# Patient Record
Sex: Female | Born: 1950
Health system: Southern US, Community
[De-identification: ages and names within clinical notes are randomized; demographics above are authoritative.]

## PROBLEM LIST (undated history)

## (undated) DIAGNOSIS — C569 Malignant neoplasm of unspecified ovary: Secondary | ICD-10-CM

## (undated) DIAGNOSIS — C801 Malignant (primary) neoplasm, unspecified: Secondary | ICD-10-CM

## (undated) DIAGNOSIS — S83209A Unspecified tear of unspecified meniscus, current injury, unspecified knee, initial encounter: Secondary | ICD-10-CM

## (undated) DIAGNOSIS — N189 Chronic kidney disease, unspecified: Secondary | ICD-10-CM

## (undated) HISTORY — PX: APPENDECTOMY: SHX54

## (undated) HISTORY — DX: Malignant neoplasm of unspecified ovary: C56.9

---

## 2000-01-29 ENCOUNTER — Other Ambulatory Visit: Admission: RE | Admit: 2000-01-29 | Discharge: 2000-01-29 | Payer: Self-pay | Admitting: *Deleted

## 2001-03-03 ENCOUNTER — Other Ambulatory Visit: Admission: RE | Admit: 2001-03-03 | Discharge: 2001-03-03 | Payer: Self-pay | Admitting: *Deleted

## 2002-04-13 ENCOUNTER — Other Ambulatory Visit: Admission: RE | Admit: 2002-04-13 | Discharge: 2002-04-13 | Payer: Self-pay | Admitting: *Deleted

## 2003-04-19 ENCOUNTER — Other Ambulatory Visit: Admission: RE | Admit: 2003-04-19 | Discharge: 2003-04-19 | Payer: Self-pay | Admitting: *Deleted

## 2003-10-06 HISTORY — PX: BREAST LUMPECTOMY: SHX2

## 2004-03-28 ENCOUNTER — Emergency Department (HOSPITAL_COMMUNITY): Admission: EM | Admit: 2004-03-28 | Discharge: 2004-03-28 | Payer: Self-pay | Admitting: Emergency Medicine

## 2004-04-28 ENCOUNTER — Encounter: Admission: RE | Admit: 2004-04-28 | Discharge: 2004-04-28 | Payer: Self-pay | Admitting: *Deleted

## 2004-05-05 ENCOUNTER — Encounter: Admission: RE | Admit: 2004-05-05 | Discharge: 2004-05-05 | Payer: Self-pay | Admitting: *Deleted

## 2004-05-15 ENCOUNTER — Encounter: Admission: RE | Admit: 2004-05-15 | Discharge: 2004-05-15 | Payer: Self-pay | Admitting: *Deleted

## 2004-05-15 ENCOUNTER — Encounter (INDEPENDENT_AMBULATORY_CARE_PROVIDER_SITE_OTHER): Payer: Self-pay | Admitting: Specialist

## 2004-05-27 ENCOUNTER — Encounter (HOSPITAL_COMMUNITY): Admission: RE | Admit: 2004-05-27 | Discharge: 2004-06-10 | Payer: Self-pay | Admitting: General Surgery

## 2004-06-11 ENCOUNTER — Encounter: Admission: RE | Admit: 2004-06-11 | Discharge: 2004-06-11 | Payer: Self-pay | Admitting: Oncology

## 2004-06-20 ENCOUNTER — Encounter (INDEPENDENT_AMBULATORY_CARE_PROVIDER_SITE_OTHER): Payer: Self-pay | Admitting: Specialist

## 2004-06-20 ENCOUNTER — Ambulatory Visit (HOSPITAL_COMMUNITY): Admission: RE | Admit: 2004-06-20 | Discharge: 2004-06-20 | Payer: Self-pay | Admitting: General Surgery

## 2004-06-20 ENCOUNTER — Ambulatory Visit (HOSPITAL_BASED_OUTPATIENT_CLINIC_OR_DEPARTMENT_OTHER): Admission: RE | Admit: 2004-06-20 | Discharge: 2004-06-20 | Payer: Self-pay | Admitting: General Surgery

## 2004-06-26 ENCOUNTER — Ambulatory Visit (HOSPITAL_COMMUNITY): Admission: RE | Admit: 2004-06-26 | Discharge: 2004-06-26 | Payer: Self-pay | Admitting: Oncology

## 2004-07-15 ENCOUNTER — Ambulatory Visit: Admission: RE | Admit: 2004-07-15 | Discharge: 2004-07-15 | Payer: Self-pay | Admitting: Oncology

## 2004-07-15 ENCOUNTER — Encounter (INDEPENDENT_AMBULATORY_CARE_PROVIDER_SITE_OTHER): Payer: Self-pay | Admitting: Cardiology

## 2004-07-18 ENCOUNTER — Ambulatory Visit: Admission: RE | Admit: 2004-07-18 | Discharge: 2004-09-12 | Payer: Self-pay | Admitting: Radiation Oncology

## 2004-08-13 ENCOUNTER — Ambulatory Visit (HOSPITAL_BASED_OUTPATIENT_CLINIC_OR_DEPARTMENT_OTHER): Admission: RE | Admit: 2004-08-13 | Discharge: 2004-08-13 | Payer: Self-pay | Admitting: General Surgery

## 2004-08-18 ENCOUNTER — Ambulatory Visit: Payer: Self-pay | Admitting: Oncology

## 2004-10-05 IMAGING — CT CT ABDOMEN W/ CM
1 of 4 series · 14 of 32 positions shown, 19 images · IV contrast (omnipaque)
Comparison: none

CLINICAL DATA: Breast cancer, new diagnosis.  Rule out metastatic disease.
 CT CHEST WITH CONTRAST:
 150 cc of Omnipaque 300 IV.  No comparison. 
 There has been recent breast biopsy on the right with some right axillary hematoma and gas in the soft tissues.  There is no adenopathy in the chest.  There is no lung nodule.  There is no infiltrate or effusion.
 IMPRESSION
 Postoperative changes in the right breast and axilla.  Negative for metastatic disease to the chest.
 CT ABDOMEN WITH CONTRAST:
 The liver and spleen, pancreas, and kidneys are normal.  There is no adenopathy.  The bowel is not dilated.
 Negative.
 CT PELVIS WITH CONTRAST:
 The bowel is not dilated.  There is no adenopathy.  The uterus is mildly enlarged and heterogeneous compatible with multiple small fibroids.  The right ovary measures 19 x 21 mm with a probable 12 mm complex cyst.
 Negative for metastatic disease to the pelvis.
 CT BRAIN WITHOUT AND WITH CONTRAST:
 No comparison. 
 The ventricles are not enlarged.  There is no hemorrhage, mass, or infarct.  The enhancement pattern is normal.  The skull is normal.
 Negative for metastatic disease to the brain.

[Series 2: cap w/iv 5.0 b30f · axial · 0.61mm/px · z∈[-631,-71]mm · 14 of 128 slices shown, 19 images]
[im 8/128  soft-tissue]
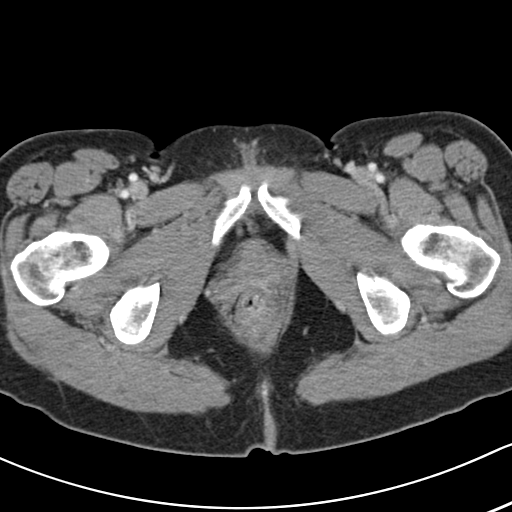
[im 8/128  bone]
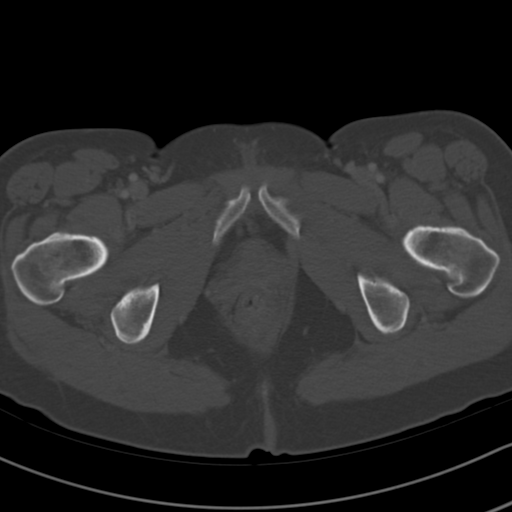
[im 16/128  soft-tissue]
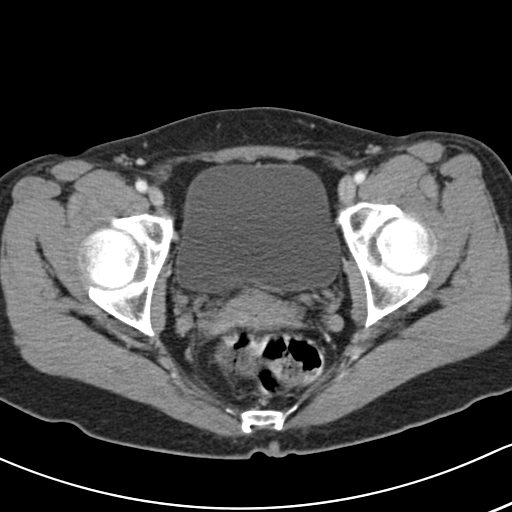
[im 24/128  soft-tissue]
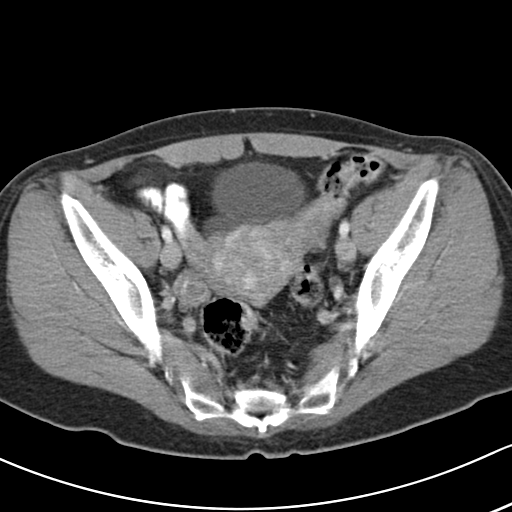
[im 40/128  soft-tissue]
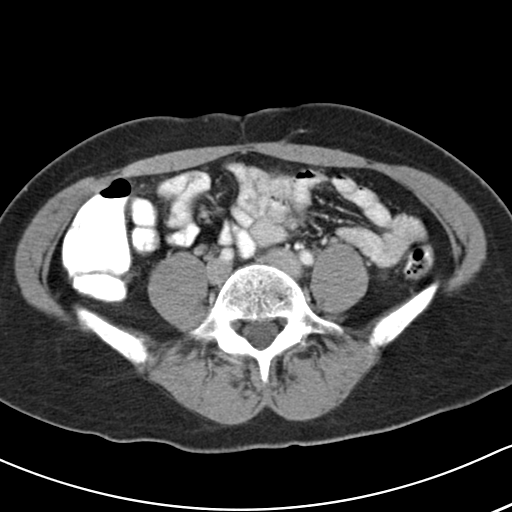
[im 48/128  soft-tissue]
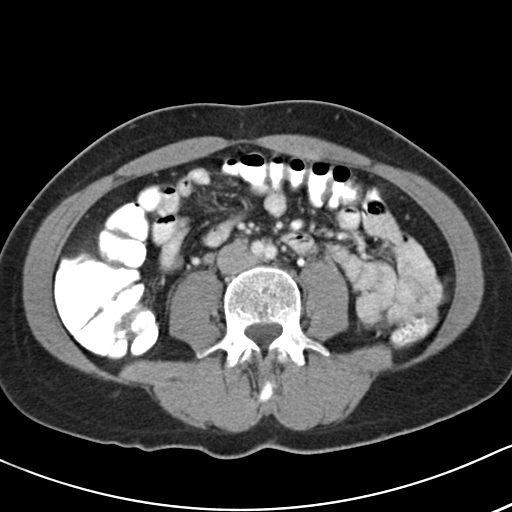
[im 56/128  soft-tissue]
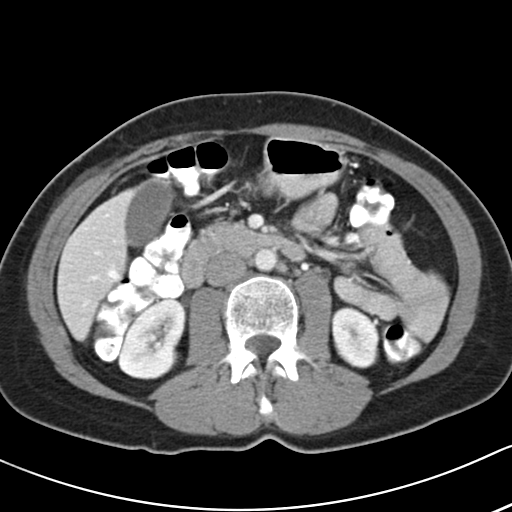
[im 64/128  soft-tissue]
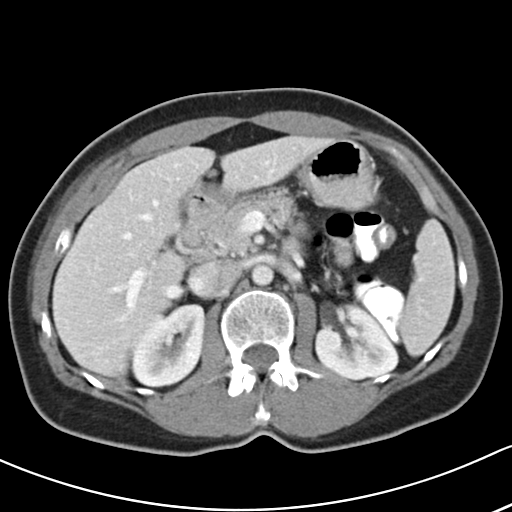
[im 72/128  soft-tissue]
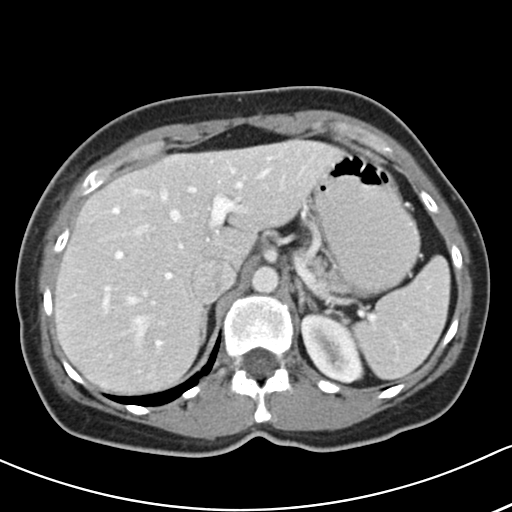
[im 80/128  soft-tissue]
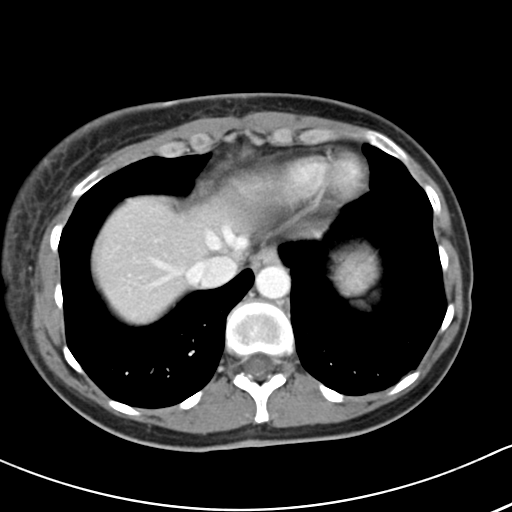
[im 80/128  bone]
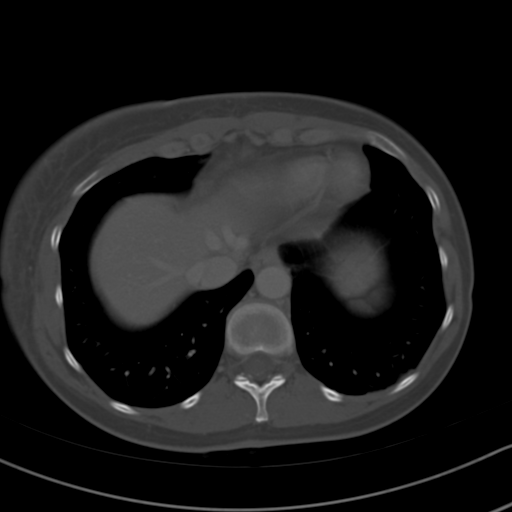
[im 88/128  soft-tissue]
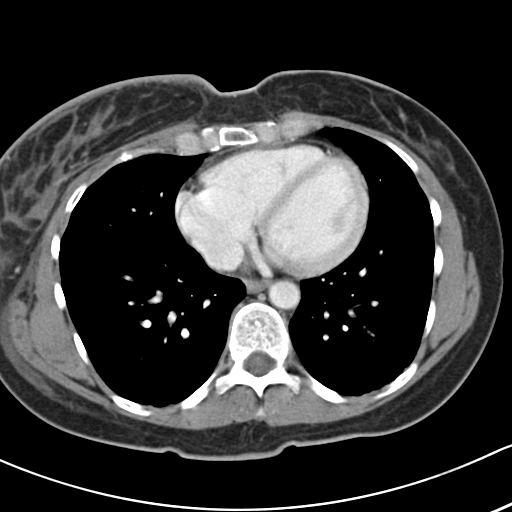
[im 96/128  lung]
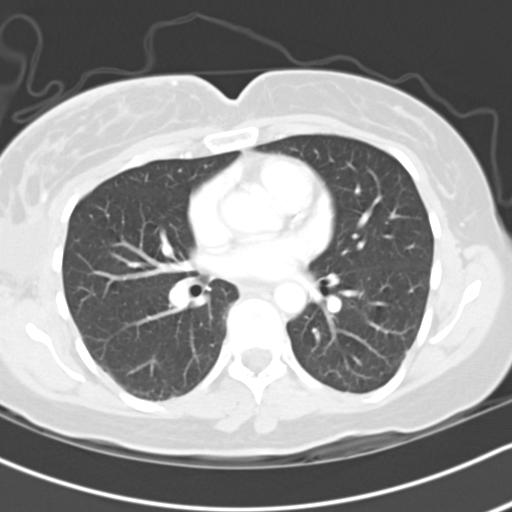
[im 104/128  soft-tissue]
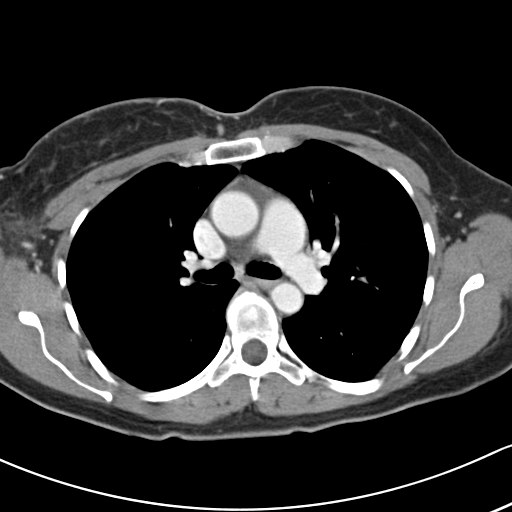
[im 104/128  lung]
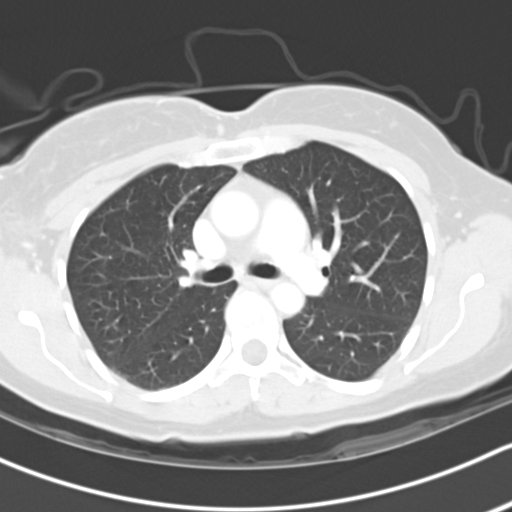
[im 112/128  soft-tissue]
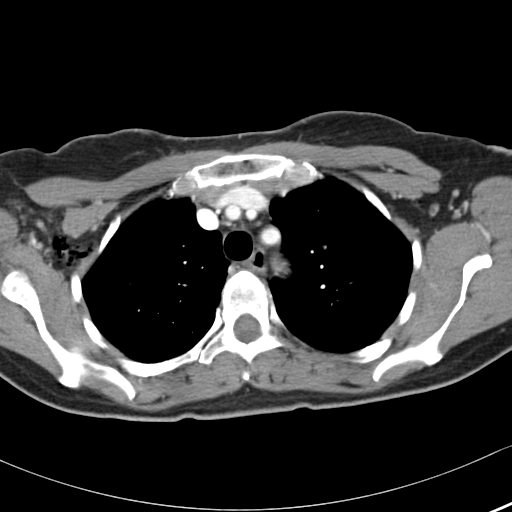
[im 112/128  lung]
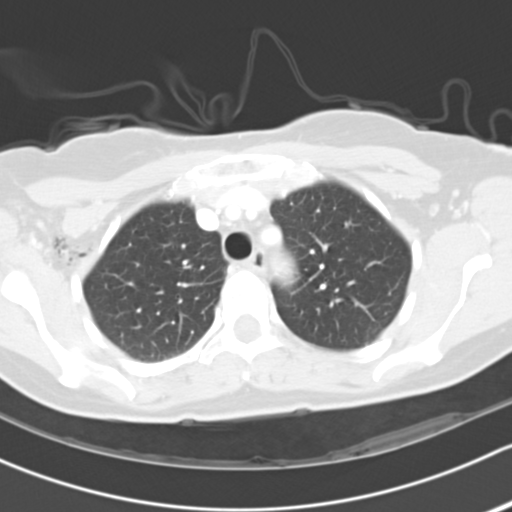
[im 120/128  soft-tissue]
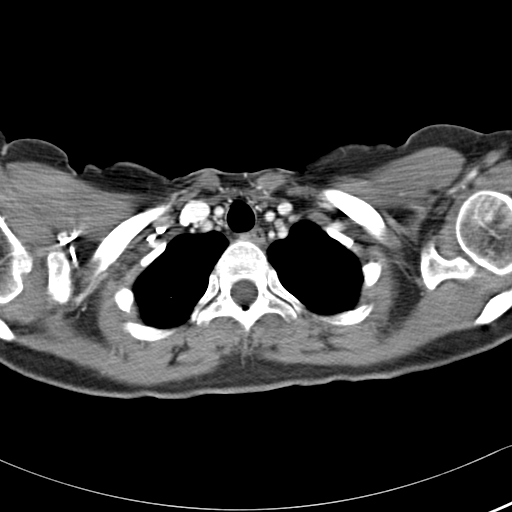
[im 120/128  lung]
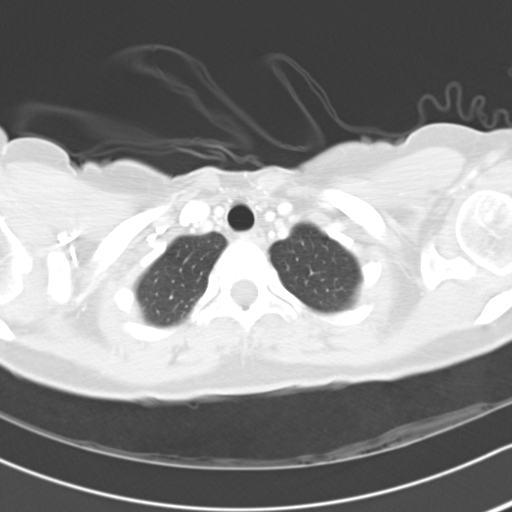

[14 of 32 positions shown; findings below may reference images not displayed]

## 2004-10-08 ENCOUNTER — Ambulatory Visit: Payer: Self-pay | Admitting: Oncology

## 2004-11-04 ENCOUNTER — Ambulatory Visit: Admission: RE | Admit: 2004-11-04 | Discharge: 2005-01-15 | Payer: Self-pay | Admitting: Radiation Oncology

## 2004-11-13 ENCOUNTER — Encounter: Admission: RE | Admit: 2004-11-13 | Discharge: 2004-11-13 | Payer: Self-pay | Admitting: Radiation Oncology

## 2004-11-13 ENCOUNTER — Encounter (INDEPENDENT_AMBULATORY_CARE_PROVIDER_SITE_OTHER): Payer: Self-pay | Admitting: *Deleted

## 2004-11-13 ENCOUNTER — Ambulatory Visit: Admission: RE | Admit: 2004-11-13 | Discharge: 2004-11-13 | Payer: Self-pay | Admitting: Oncology

## 2004-11-25 ENCOUNTER — Ambulatory Visit: Payer: Self-pay | Admitting: Oncology

## 2004-12-01 ENCOUNTER — Ambulatory Visit (HOSPITAL_BASED_OUTPATIENT_CLINIC_OR_DEPARTMENT_OTHER): Admission: RE | Admit: 2004-12-01 | Discharge: 2004-12-01 | Payer: Self-pay | Admitting: General Surgery

## 2005-02-04 ENCOUNTER — Ambulatory Visit: Admission: RE | Admit: 2005-02-04 | Discharge: 2005-02-04 | Payer: Self-pay | Admitting: Radiation Oncology

## 2005-02-11 ENCOUNTER — Encounter: Admission: RE | Admit: 2005-02-11 | Discharge: 2005-02-20 | Payer: Self-pay | Admitting: Radiation Oncology

## 2005-03-23 ENCOUNTER — Ambulatory Visit: Payer: Self-pay | Admitting: Oncology

## 2005-06-02 ENCOUNTER — Encounter: Admission: RE | Admit: 2005-06-02 | Discharge: 2005-06-02 | Payer: Self-pay | Admitting: Radiation Oncology

## 2005-06-30 ENCOUNTER — Ambulatory Visit: Payer: Self-pay | Admitting: Oncology

## 2005-07-07 ENCOUNTER — Ambulatory Visit: Admission: RE | Admit: 2005-07-07 | Discharge: 2005-07-07 | Payer: Self-pay | Admitting: Oncology

## 2005-07-07 ENCOUNTER — Encounter (INDEPENDENT_AMBULATORY_CARE_PROVIDER_SITE_OTHER): Payer: Self-pay | Admitting: Cardiology

## 2005-10-06 ENCOUNTER — Ambulatory Visit: Payer: Self-pay | Admitting: Oncology

## 2005-10-07 ENCOUNTER — Encounter: Admission: RE | Admit: 2005-10-07 | Discharge: 2005-10-07 | Payer: Self-pay | Admitting: Oncology

## 2006-03-09 ENCOUNTER — Ambulatory Visit (HOSPITAL_COMMUNITY): Admission: RE | Admit: 2006-03-09 | Discharge: 2006-03-09 | Payer: Self-pay | Admitting: Oncology

## 2006-03-31 ENCOUNTER — Ambulatory Visit: Payer: Self-pay | Admitting: Oncology

## 2006-04-08 LAB — CBC WITH DIFFERENTIAL/PLATELET
BASO%: 0.3 % (ref 0.0–2.0)
Basophils Absolute: 0 10*3/uL (ref 0.0–0.1)
EOS%: 1.9 % (ref 0.0–7.0)
Eosinophils Absolute: 0.1 10*3/uL (ref 0.0–0.5)
HCT: 38.4 % (ref 34.8–46.6)
HGB: 13.4 g/dL (ref 11.6–15.9)
LYMPH%: 30.7 % (ref 14.0–48.0)
MCH: 31.4 pg (ref 26.0–34.0)
MCHC: 34.8 g/dL (ref 32.0–36.0)
MCV: 90.1 fL (ref 81.0–101.0)
MONO#: 0.2 10*3/uL (ref 0.1–0.9)
MONO%: 5.2 % (ref 0.0–13.0)
NEUT#: 2.7 10*3/uL (ref 1.5–6.5)
NEUT%: 61.9 % (ref 39.6–76.8)
Platelets: 205 10*3/uL (ref 145–400)
RBC: 4.27 10*6/uL (ref 3.70–5.32)
RDW: 12.9 % (ref 11.3–14.5)
WBC: 4.3 10*3/uL (ref 3.9–10.0)
lymph#: 1.3 10*3/uL (ref 0.9–3.3)

## 2006-04-08 LAB — COMPREHENSIVE METABOLIC PANEL
ALT: 14 U/L (ref 0–40)
AST: 19 U/L (ref 0–37)
Albumin: 4.4 g/dL (ref 3.5–5.2)
Alkaline Phosphatase: 82 U/L (ref 39–117)
BUN: 15 mg/dL (ref 6–23)
CO2: 29 mEq/L (ref 19–32)
Calcium: 9.8 mg/dL (ref 8.4–10.5)
Chloride: 108 mEq/L (ref 96–112)
Creatinine, Ser: 0.96 mg/dL (ref 0.40–1.20)
Glucose, Bld: 94 mg/dL (ref 70–99)
Potassium: 4 mEq/L (ref 3.5–5.3)
Sodium: 145 mEq/L (ref 135–145)
Total Bilirubin: 0.7 mg/dL (ref 0.3–1.2)
Total Protein: 6.8 g/dL (ref 6.0–8.3)

## 2006-04-08 LAB — LIPID PANEL
Cholesterol: 177 mg/dL (ref 0–200)
HDL: 58 mg/dL (ref >39)
LDL Cholesterol: 96 mg/dL (ref 0–99)
Total CHOL/HDL Ratio: 3.1 Ratio
Triglycerides: 115 mg/dL (ref <150)
VLDL: 23 mg/dL (ref 0–40)

## 2006-04-08 LAB — CANCER ANTIGEN 27.29: CA 27.29: 23 U/mL (ref 0–39)

## 2006-04-26 ENCOUNTER — Encounter: Admission: RE | Admit: 2006-04-26 | Discharge: 2006-05-19 | Payer: Self-pay | Admitting: Oncology

## 2006-06-04 ENCOUNTER — Encounter: Admission: RE | Admit: 2006-06-04 | Discharge: 2006-06-04 | Payer: Self-pay | Admitting: General Surgery

## 2006-10-06 ENCOUNTER — Ambulatory Visit: Payer: Self-pay | Admitting: Oncology

## 2006-10-08 LAB — COMPREHENSIVE METABOLIC PANEL
ALT: 12 U/L (ref 0–35)
AST: 19 U/L (ref 0–37)
Albumin: 4.6 g/dL (ref 3.5–5.2)
Alkaline Phosphatase: 85 U/L (ref 39–117)
BUN: 12 mg/dL (ref 6–23)
CO2: 27 mEq/L (ref 19–32)
Calcium: 9.8 mg/dL (ref 8.4–10.5)
Chloride: 106 mEq/L (ref 96–112)
Creatinine, Ser: 0.96 mg/dL (ref 0.40–1.20)
Glucose, Bld: 101 mg/dL — ABNORMAL HIGH (ref 70–99)
Potassium: 4 mEq/L (ref 3.5–5.3)
Sodium: 143 mEq/L (ref 135–145)
Total Bilirubin: 0.9 mg/dL (ref 0.3–1.2)
Total Protein: 6.9 g/dL (ref 6.0–8.3)

## 2006-10-08 LAB — CBC WITH DIFFERENTIAL/PLATELET
BASO%: 0.5 % (ref 0.0–2.0)
Basophils Absolute: 0 10*3/uL (ref 0.0–0.1)
EOS%: 1.2 % (ref 0.0–7.0)
Eosinophils Absolute: 0 10*3/uL (ref 0.0–0.5)
HCT: 40.7 % (ref 34.8–46.6)
HGB: 14 g/dL (ref 11.6–15.9)
LYMPH%: 35.7 % (ref 14.0–48.0)
MCH: 30.7 pg (ref 26.0–34.0)
MCHC: 34.3 g/dL (ref 32.0–36.0)
MCV: 89.5 fL (ref 81.0–101.0)
MONO#: 0.2 10*3/uL (ref 0.1–0.9)
MONO%: 5.9 % (ref 0.0–13.0)
NEUT#: 2.1 10*3/uL (ref 1.5–6.5)
NEUT%: 56.7 % (ref 39.6–76.8)
Platelets: 241 10*3/uL (ref 145–400)
RBC: 4.55 10*6/uL (ref 3.70–5.32)
RDW: 12.7 % (ref 11.3–14.5)
WBC: 3.6 10*3/uL — ABNORMAL LOW (ref 3.9–10.0)
lymph#: 1.3 10*3/uL (ref 0.9–3.3)

## 2006-10-08 LAB — CANCER ANTIGEN 27.29: CA 27.29: 21 U/mL (ref 0–39)

## 2007-06-03 ENCOUNTER — Ambulatory Visit: Payer: Self-pay | Admitting: Oncology

## 2007-06-08 ENCOUNTER — Encounter: Admission: RE | Admit: 2007-06-08 | Discharge: 2007-06-08 | Payer: Self-pay | Admitting: Oncology

## 2007-06-08 LAB — COMPREHENSIVE METABOLIC PANEL
ALT: 12 U/L (ref 0–35)
AST: 19 U/L (ref 0–37)
Albumin: 4.5 g/dL (ref 3.5–5.2)
Alkaline Phosphatase: 84 U/L (ref 39–117)
BUN: 11 mg/dL (ref 6–23)
CO2: 26 mEq/L (ref 19–32)
Calcium: 9.5 mg/dL (ref 8.4–10.5)
Chloride: 107 mEq/L (ref 96–112)
Creatinine, Ser: 0.94 mg/dL (ref 0.40–1.20)
Glucose, Bld: 104 mg/dL — ABNORMAL HIGH (ref 70–99)
Potassium: 3.7 mEq/L (ref 3.5–5.3)
Sodium: 140 mEq/L (ref 135–145)
Total Bilirubin: 0.7 mg/dL (ref 0.3–1.2)
Total Protein: 6.8 g/dL (ref 6.0–8.3)

## 2007-06-08 LAB — CBC WITH DIFFERENTIAL/PLATELET
BASO%: 0.6 % (ref 0.0–2.0)
Basophils Absolute: 0 10*3/uL (ref 0.0–0.1)
EOS%: 2.6 % (ref 0.0–7.0)
Eosinophils Absolute: 0.1 10*3/uL (ref 0.0–0.5)
HCT: 38.7 % (ref 34.8–46.6)
HGB: 13.9 g/dL (ref 11.6–15.9)
LYMPH%: 38.8 % (ref 14.0–48.0)
MCH: 30.6 pg (ref 26.0–34.0)
MCHC: 35.8 g/dL (ref 32.0–36.0)
MCV: 85.4 fL (ref 81.0–101.0)
MONO#: 0.2 10*3/uL (ref 0.1–0.9)
MONO%: 6.1 % (ref 0.0–13.0)
NEUT#: 1.8 10*3/uL (ref 1.5–6.5)
NEUT%: 51.9 % (ref 39.6–76.8)
Platelets: 200 10*3/uL (ref 145–400)
RBC: 4.54 10*6/uL (ref 3.70–5.32)
RDW: 10.9 % — ABNORMAL LOW (ref 11.3–14.5)
WBC: 3.5 10*3/uL — ABNORMAL LOW (ref 3.9–10.0)
lymph#: 1.4 10*3/uL (ref 0.9–3.3)

## 2007-06-08 LAB — CANCER ANTIGEN 27.29: CA 27.29: 18 U/mL (ref 0–39)

## 2007-06-08 LAB — FOLLICLE STIMULATING HORMONE: FSH: 76.9 m[IU]/mL

## 2007-06-10 ENCOUNTER — Ambulatory Visit (HOSPITAL_COMMUNITY): Admission: RE | Admit: 2007-06-10 | Discharge: 2007-06-10 | Payer: Self-pay | Admitting: Oncology

## 2007-06-15 LAB — ESTRADIOL, ULTRA SENS: Estradiol, Ultra Sensitive: 14 pg/mL

## 2007-12-20 ENCOUNTER — Ambulatory Visit: Payer: Self-pay | Admitting: Oncology

## 2007-12-22 LAB — CBC WITH DIFFERENTIAL/PLATELET
BASO%: 0.3 % (ref 0.0–2.0)
Basophils Absolute: 0 10*3/uL (ref 0.0–0.1)
EOS%: 1.5 % (ref 0.0–7.0)
Eosinophils Absolute: 0.1 10*3/uL (ref 0.0–0.5)
HCT: 42.3 % (ref 34.8–46.6)
HGB: 15 g/dL (ref 11.6–15.9)
LYMPH%: 38 % (ref 14.0–48.0)
MCH: 30.6 pg (ref 26.0–34.0)
MCHC: 35.4 g/dL (ref 32.0–36.0)
MCV: 86.6 fL (ref 81.0–101.0)
MONO#: 0.2 10*3/uL (ref 0.1–0.9)
MONO%: 5 % (ref 0.0–13.0)
NEUT#: 2.2 10*3/uL (ref 1.5–6.5)
NEUT%: 55.2 % (ref 39.6–76.8)
Platelets: 225 10*3/uL (ref 145–400)
RBC: 4.88 10*6/uL (ref 3.70–5.32)
RDW: 12.8 % (ref 11.3–14.5)
WBC: 4 10*3/uL (ref 3.9–10.0)
lymph#: 1.5 10*3/uL (ref 0.9–3.3)

## 2007-12-23 LAB — COMPREHENSIVE METABOLIC PANEL
ALT: 11 U/L (ref 0–35)
AST: 19 U/L (ref 0–37)
Albumin: 4.7 g/dL (ref 3.5–5.2)
Alkaline Phosphatase: 93 U/L (ref 39–117)
BUN: 12 mg/dL (ref 6–23)
CO2: 25 mEq/L (ref 19–32)
Calcium: 9.9 mg/dL (ref 8.4–10.5)
Chloride: 106 mEq/L (ref 96–112)
Creatinine, Ser: 0.84 mg/dL (ref 0.40–1.20)
Glucose, Bld: 98 mg/dL (ref 70–99)
Potassium: 4.1 mEq/L (ref 3.5–5.3)
Sodium: 143 mEq/L (ref 135–145)
Total Bilirubin: 0.9 mg/dL (ref 0.3–1.2)
Total Protein: 7.5 g/dL (ref 6.0–8.3)

## 2007-12-23 LAB — LIPID PANEL
Cholesterol: 215 mg/dL — ABNORMAL HIGH (ref 0–200)
HDL: 64 mg/dL (ref >39)
LDL Cholesterol: 129 mg/dL — ABNORMAL HIGH (ref 0–99)
Total CHOL/HDL Ratio: 3.4 Ratio
Triglycerides: 108 mg/dL (ref <150)
VLDL: 22 mg/dL (ref 0–40)

## 2007-12-23 LAB — VITAMIN D 25 HYDROXY (VIT D DEFICIENCY, FRACTURES): Vit D, 25-Hydroxy: 33 ng/mL (ref 30–89)

## 2007-12-23 LAB — CANCER ANTIGEN 27.29: CA 27.29: 25 U/mL (ref 0–39)

## 2007-12-27 LAB — VITAMIN D 1,25 DIHYDROXY: Vit D, 1,25-Dihydroxy: 78 pg/mL — ABNORMAL HIGH (ref 6–62)

## 2008-06-12 ENCOUNTER — Encounter: Admission: RE | Admit: 2008-06-12 | Discharge: 2008-06-12 | Payer: Self-pay | Admitting: Oncology

## 2008-06-19 ENCOUNTER — Ambulatory Visit: Payer: Self-pay | Admitting: Oncology

## 2008-06-21 LAB — COMPREHENSIVE METABOLIC PANEL
ALT: 13 U/L (ref 0–35)
AST: 17 U/L (ref 0–37)
Albumin: 4.6 g/dL (ref 3.5–5.2)
Alkaline Phosphatase: 87 U/L (ref 39–117)
BUN: 9 mg/dL (ref 6–23)
CO2: 27 mEq/L (ref 19–32)
Calcium: 10.1 mg/dL (ref 8.4–10.5)
Chloride: 108 mEq/L (ref 96–112)
Creatinine, Ser: 0.92 mg/dL (ref 0.40–1.20)
Glucose, Bld: 88 mg/dL (ref 70–99)
Potassium: 4.1 mEq/L (ref 3.5–5.3)
Sodium: 143 mEq/L (ref 135–145)
Total Bilirubin: 0.9 mg/dL (ref 0.3–1.2)
Total Protein: 7.1 g/dL (ref 6.0–8.3)

## 2008-06-21 LAB — CBC WITH DIFFERENTIAL/PLATELET
BASO%: 0.3 % (ref 0.0–2.0)
Basophils Absolute: 0 10*3/uL (ref 0.0–0.1)
EOS%: 1.3 % (ref 0.0–7.0)
Eosinophils Absolute: 0 10*3/uL (ref 0.0–0.5)
HCT: 42 % (ref 34.8–46.6)
HGB: 14.6 g/dL (ref 11.6–15.9)
LYMPH%: 37.2 % (ref 14.0–48.0)
MCH: 31 pg (ref 26.0–34.0)
MCHC: 34.9 g/dL (ref 32.0–36.0)
MCV: 88.7 fL (ref 81.0–101.0)
MONO#: 0.2 10*3/uL (ref 0.1–0.9)
MONO%: 6.6 % (ref 0.0–13.0)
NEUT#: 1.9 10*3/uL (ref 1.5–6.5)
NEUT%: 54.6 % (ref 39.6–76.8)
Platelets: 202 10*3/uL (ref 145–400)
RBC: 4.73 10*6/uL (ref 3.70–5.32)
RDW: 13.5 % (ref 11.3–14.5)
WBC: 3.4 10*3/uL — ABNORMAL LOW (ref 3.9–10.0)
lymph#: 1.3 10*3/uL (ref 0.9–3.3)

## 2008-06-21 LAB — CANCER ANTIGEN 27.29: CA 27.29: 27 U/mL (ref 0–39)

## 2008-11-21 ENCOUNTER — Other Ambulatory Visit: Admission: RE | Admit: 2008-11-21 | Discharge: 2008-11-21 | Payer: Self-pay | Admitting: Obstetrics & Gynecology

## 2008-11-23 ENCOUNTER — Ambulatory Visit: Payer: Self-pay | Admitting: Oncology

## 2008-11-23 LAB — CBC WITH DIFFERENTIAL/PLATELET
BASO%: 0.4 % (ref 0.0–2.0)
Basophils Absolute: 0 10*3/uL (ref 0.0–0.1)
EOS%: 1.9 % (ref 0.0–7.0)
Eosinophils Absolute: 0.1 10*3/uL (ref 0.0–0.5)
HCT: 42.4 % (ref 34.8–46.6)
HGB: 14.8 g/dL (ref 11.6–15.9)
LYMPH%: 36.9 % (ref 14.0–49.7)
MCH: 30.7 pg (ref 25.1–34.0)
MCHC: 34.9 g/dL (ref 31.5–36.0)
MCV: 87.9 fL (ref 79.5–101.0)
MONO#: 0.2 10*3/uL (ref 0.1–0.9)
MONO%: 8.1 % (ref 0.0–14.0)
NEUT#: 1.5 10*3/uL (ref 1.5–6.5)
NEUT%: 52.7 % (ref 38.4–76.8)
Platelets: 206 10*3/uL (ref 145–400)
RBC: 4.83 10*6/uL (ref 3.70–5.45)
RDW: 12.6 % (ref 11.2–14.5)
WBC: 2.8 10*3/uL — ABNORMAL LOW (ref 3.9–10.3)
lymph#: 1 10*3/uL (ref 0.9–3.3)

## 2008-11-26 LAB — LIPID PANEL
Cholesterol: 208 mg/dL — ABNORMAL HIGH (ref 0–200)
HDL: 63 mg/dL (ref >39)
LDL Cholesterol: 128 mg/dL — ABNORMAL HIGH (ref 0–99)
Total CHOL/HDL Ratio: 3.3 Ratio
Triglycerides: 85 mg/dL (ref <150)
VLDL: 17 mg/dL (ref 0–40)

## 2008-11-26 LAB — COMPREHENSIVE METABOLIC PANEL
ALT: 14 U/L (ref 0–35)
AST: 17 U/L (ref 0–37)
Albumin: 4.7 g/dL (ref 3.5–5.2)
Alkaline Phosphatase: 72 U/L (ref 39–117)
BUN: 13 mg/dL (ref 6–23)
CO2: 26 mEq/L (ref 19–32)
Calcium: 9.7 mg/dL (ref 8.4–10.5)
Chloride: 109 mEq/L (ref 96–112)
Creatinine, Ser: 0.89 mg/dL (ref 0.40–1.20)
Glucose, Bld: 93 mg/dL (ref 70–99)
Potassium: 3.9 mEq/L (ref 3.5–5.3)
Sodium: 142 mEq/L (ref 135–145)
Total Bilirubin: 0.6 mg/dL (ref 0.3–1.2)
Total Protein: 7.2 g/dL (ref 6.0–8.3)

## 2008-11-26 LAB — CANCER ANTIGEN 27.29: CA 27.29: 24 U/mL (ref 0–39)

## 2008-11-26 LAB — VITAMIN D 25 HYDROXY (VIT D DEFICIENCY, FRACTURES): Vit D, 25-Hydroxy: 31 ng/mL (ref 30–89)

## 2009-06-13 ENCOUNTER — Encounter: Admission: RE | Admit: 2009-06-13 | Discharge: 2009-06-13 | Payer: Self-pay | Admitting: Oncology

## 2009-06-18 ENCOUNTER — Ambulatory Visit: Payer: Self-pay | Admitting: Oncology

## 2009-06-20 LAB — CBC WITH DIFFERENTIAL/PLATELET
BASO%: 0.3 % (ref 0.0–2.0)
Basophils Absolute: 0 10*3/uL (ref 0.0–0.1)
EOS%: 1.3 % (ref 0.0–7.0)
Eosinophils Absolute: 0 10*3/uL (ref 0.0–0.5)
HCT: 40.4 % (ref 34.8–46.6)
HGB: 14 g/dL (ref 11.6–15.9)
LYMPH%: 34.9 % (ref 14.0–49.7)
MCH: 31.1 pg (ref 25.1–34.0)
MCHC: 34.7 g/dL (ref 31.5–36.0)
MCV: 89.5 fL (ref 79.5–101.0)
MONO#: 0.2 10*3/uL (ref 0.1–0.9)
MONO%: 7.5 % (ref 0.0–14.0)
NEUT#: 1.8 10*3/uL (ref 1.5–6.5)
NEUT%: 56 % (ref 38.4–76.8)
Platelets: 193 10*3/uL (ref 145–400)
RBC: 4.52 10*6/uL (ref 3.70–5.45)
RDW: 13.2 % (ref 11.2–14.5)
WBC: 3.2 10*3/uL — ABNORMAL LOW (ref 3.9–10.3)
lymph#: 1.1 10*3/uL (ref 0.9–3.3)

## 2009-06-21 LAB — COMPREHENSIVE METABOLIC PANEL
ALT: 11 U/L (ref 0–35)
AST: 18 U/L (ref 0–37)
Albumin: 4.3 g/dL (ref 3.5–5.2)
Alkaline Phosphatase: 64 U/L (ref 39–117)
BUN: 12 mg/dL (ref 6–23)
CO2: 25 mEq/L (ref 19–32)
Calcium: 9.8 mg/dL (ref 8.4–10.5)
Chloride: 110 mEq/L (ref 96–112)
Creatinine, Ser: 0.89 mg/dL (ref 0.40–1.20)
Glucose, Bld: 96 mg/dL (ref 70–99)
Potassium: 4.4 mEq/L (ref 3.5–5.3)
Sodium: 143 mEq/L (ref 135–145)
Total Bilirubin: 0.6 mg/dL (ref 0.3–1.2)
Total Protein: 6.6 g/dL (ref 6.0–8.3)

## 2009-06-21 LAB — LIPID PANEL
Cholesterol: 186 mg/dL (ref 0–200)
HDL: 57 mg/dL (ref >39)
LDL Cholesterol: 116 mg/dL — ABNORMAL HIGH (ref 0–99)
Total CHOL/HDL Ratio: 3.3 Ratio
Triglycerides: 66 mg/dL (ref <150)
VLDL: 13 mg/dL (ref 0–40)

## 2009-06-21 LAB — CANCER ANTIGEN 27.29: CA 27.29: 30 U/mL (ref 0–39)

## 2009-06-21 LAB — VITAMIN D 25 HYDROXY (VIT D DEFICIENCY, FRACTURES): Vit D, 25-Hydroxy: 31 ng/mL (ref 30–89)

## 2009-10-29 ENCOUNTER — Ambulatory Visit: Payer: Self-pay | Admitting: Oncology

## 2009-10-30 ENCOUNTER — Encounter: Admission: RE | Admit: 2009-10-30 | Discharge: 2009-10-30 | Payer: Self-pay | Admitting: Oncology

## 2009-11-07 ENCOUNTER — Encounter: Admission: RE | Admit: 2009-11-07 | Discharge: 2009-11-07 | Payer: Self-pay | Admitting: Oncology

## 2009-12-02 ENCOUNTER — Encounter: Admission: RE | Admit: 2009-12-02 | Discharge: 2009-12-31 | Payer: Self-pay | Admitting: Oncology

## 2009-12-11 ENCOUNTER — Ambulatory Visit: Payer: Self-pay | Admitting: Oncology

## 2009-12-13 LAB — CBC WITH DIFFERENTIAL/PLATELET
BASO%: 0.3 % (ref 0.0–2.0)
Basophils Absolute: 0 10*3/uL (ref 0.0–0.1)
EOS%: 2.4 % (ref 0.0–7.0)
Eosinophils Absolute: 0.1 10*3/uL (ref 0.0–0.5)
HCT: 41 % (ref 34.8–46.6)
HGB: 14.3 g/dL (ref 11.6–15.9)
LYMPH%: 30.1 % (ref 14.0–49.7)
MCH: 31.4 pg (ref 25.1–34.0)
MCHC: 34.8 g/dL (ref 31.5–36.0)
MCV: 90 fL (ref 79.5–101.0)
MONO#: 0.3 10*3/uL (ref 0.1–0.9)
MONO%: 6.3 % (ref 0.0–14.0)
NEUT#: 2.6 10*3/uL (ref 1.5–6.5)
NEUT%: 60.9 % (ref 38.4–76.8)
Platelets: 212 10*3/uL (ref 145–400)
RBC: 4.55 10*6/uL (ref 3.70–5.45)
RDW: 12.9 % (ref 11.2–14.5)
WBC: 4.3 10*3/uL (ref 3.9–10.3)
lymph#: 1.3 10*3/uL (ref 0.9–3.3)

## 2009-12-13 LAB — COMPREHENSIVE METABOLIC PANEL
ALT: 14 U/L (ref 0–35)
AST: 20 U/L (ref 0–37)
Albumin: 4.5 g/dL (ref 3.5–5.2)
Alkaline Phosphatase: 73 U/L (ref 39–117)
BUN: 18 mg/dL (ref 6–23)
CO2: 24 mEq/L (ref 19–32)
Calcium: 10.2 mg/dL (ref 8.4–10.5)
Chloride: 106 mEq/L (ref 96–112)
Creatinine, Ser: 0.85 mg/dL (ref 0.40–1.20)
Glucose, Bld: 92 mg/dL (ref 70–99)
Potassium: 3.9 mEq/L (ref 3.5–5.3)
Sodium: 141 mEq/L (ref 135–145)
Total Bilirubin: 0.7 mg/dL (ref 0.3–1.2)
Total Protein: 6.6 g/dL (ref 6.0–8.3)

## 2009-12-13 LAB — CANCER ANTIGEN 27.29: CA 27.29: 24 U/mL (ref 0–39)

## 2009-12-13 LAB — VITAMIN D 25 HYDROXY (VIT D DEFICIENCY, FRACTURES): Vit D, 25-Hydroxy: 30 ng/mL (ref 30–89)

## 2010-06-16 ENCOUNTER — Encounter: Admission: RE | Admit: 2010-06-16 | Discharge: 2010-06-16 | Payer: Self-pay | Admitting: Oncology

## 2010-06-24 ENCOUNTER — Ambulatory Visit: Payer: Self-pay | Admitting: Oncology

## 2010-06-24 LAB — COMPREHENSIVE METABOLIC PANEL
ALT: 10 U/L (ref 0–35)
AST: 19 U/L (ref 0–37)
Albumin: 4.2 g/dL (ref 3.5–5.2)
Alkaline Phosphatase: 76 U/L (ref 39–117)
BUN: 11 mg/dL (ref 6–23)
CO2: 30 mEq/L (ref 19–32)
Calcium: 9.6 mg/dL (ref 8.4–10.5)
Chloride: 107 mEq/L (ref 96–112)
Creatinine, Ser: 0.94 mg/dL (ref 0.40–1.20)
Glucose, Bld: 95 mg/dL (ref 70–99)
Potassium: 3.9 mEq/L (ref 3.5–5.3)
Sodium: 145 mEq/L (ref 135–145)
Total Bilirubin: 0.9 mg/dL (ref 0.3–1.2)
Total Protein: 6.6 g/dL (ref 6.0–8.3)

## 2010-06-24 LAB — CBC WITH DIFFERENTIAL/PLATELET
BASO%: 0.5 % (ref 0.0–2.0)
Basophils Absolute: 0 10*3/uL (ref 0.0–0.1)
EOS%: 1.7 % (ref 0.0–7.0)
Eosinophils Absolute: 0.1 10*3/uL (ref 0.0–0.5)
HCT: 41.8 % (ref 34.8–46.6)
HGB: 14.2 g/dL (ref 11.6–15.9)
LYMPH%: 39.7 % (ref 14.0–49.7)
MCH: 30.7 pg (ref 25.1–34.0)
MCHC: 33.9 g/dL (ref 31.5–36.0)
MCV: 90.4 fL (ref 79.5–101.0)
MONO#: 0.3 10*3/uL (ref 0.1–0.9)
MONO%: 8.4 % (ref 0.0–14.0)
NEUT#: 1.6 10*3/uL (ref 1.5–6.5)
NEUT%: 49.7 % (ref 38.4–76.8)
Platelets: 212 10*3/uL (ref 145–400)
RBC: 4.62 10*6/uL (ref 3.70–5.45)
RDW: 13 % (ref 11.2–14.5)
WBC: 3.1 10*3/uL — ABNORMAL LOW (ref 3.9–10.3)
lymph#: 1.2 10*3/uL (ref 0.9–3.3)

## 2010-06-24 LAB — LIPID PANEL
Cholesterol: 192 mg/dL (ref 0–200)
HDL: 59 mg/dL (ref >39)
LDL Cholesterol: 121 mg/dL — ABNORMAL HIGH (ref 0–99)
Total CHOL/HDL Ratio: 3.3 Ratio
Triglycerides: 60 mg/dL (ref <150)
VLDL: 12 mg/dL (ref 0–40)

## 2010-06-24 LAB — CANCER ANTIGEN 27.29: CA 27.29: 28 U/mL (ref 0–39)

## 2010-06-24 LAB — VITAMIN D 25 HYDROXY (VIT D DEFICIENCY, FRACTURES): Vit D, 25-Hydroxy: 42 ng/mL (ref 30–89)

## 2010-10-26 ENCOUNTER — Encounter: Payer: Self-pay | Admitting: Oncology

## 2010-11-04 ENCOUNTER — Ambulatory Visit
Admission: RE | Admit: 2010-11-04 | Discharge: 2010-11-04 | Payer: Self-pay | Source: Home / Self Care | Attending: Internal Medicine | Admitting: Internal Medicine

## 2010-11-06 ENCOUNTER — Other Ambulatory Visit: Payer: Self-pay | Admitting: Internal Medicine

## 2010-11-06 DIAGNOSIS — Z853 Personal history of malignant neoplasm of breast: Secondary | ICD-10-CM

## 2010-11-17 ENCOUNTER — Other Ambulatory Visit: Payer: Self-pay

## 2010-11-20 ENCOUNTER — Ambulatory Visit
Admission: RE | Admit: 2010-11-20 | Discharge: 2010-11-20 | Disposition: A | Discharge status: Home / Self Care | Payer: 59 | Source: Ambulatory Visit | Attending: Internal Medicine | Admitting: Internal Medicine

## 2010-11-20 DIAGNOSIS — Z853 Personal history of malignant neoplasm of breast: Secondary | ICD-10-CM

## 2011-02-20 NOTE — Op Note (Signed)
Lauren Calhoun, Lauren Calhoun                ACCOUNT NO.:  [id number]   MEDICAL RECORD NO.:  [id number]          PATIENT TYPE:  AMB   LOCATION:  [facility]                          FACILITY:  [facility]   PHYSICIAN:  Rose Phi. Maple Hudson, M.D.   DATE OF BIRTH:  1951-03-18   DATE OF PROCEDURE:  12/01/2004  DATE OF DISCHARGE:                                 OPERATIVE REPORT   PREOPERATIVE DIAGNOSIS:  Carcinoma the breast.   POSTOPERATIVE DIAGNOSIS:  Carcinoma the breast.   OPERATION:  Removal of Port-A-Cath.   SURGEON:  Rose Phi. Maple Hudson, M.D.   ANESTHESIA:  Local.   OPERATIVE PROCEDURE:  Patient placed on the operating table and the left  upper chest prepped and draped in the usual fashion.  After infiltrating the  area of the previous Port-A-Cath with 1% Xylocaine with adrenalin, incision  was made and  we exposed the port.  I grasped the catheter and removed it  and then with a little traction on the port, divided the 2 sutures holding  it in place and removed those.  There was no bleeding.  Subcuticular closure  of Monocryl and Steri-Strips.  Dressing applied.  Patient then allowed to go  home.      PRY/MEDQ  D:  12/01/2004  T:  12/02/2004  Job:  [id number]

## 2011-03-10 ENCOUNTER — Encounter (INDEPENDENT_AMBULATORY_CARE_PROVIDER_SITE_OTHER): Payer: Self-pay | Admitting: General Surgery

## 2011-05-11 ENCOUNTER — Ambulatory Visit: Payer: 59 | Admitting: Internal Medicine

## 2011-05-14 ENCOUNTER — Other Ambulatory Visit: Payer: Self-pay | Admitting: Oncology

## 2011-05-14 DIAGNOSIS — Z9889 Other specified postprocedural states: Secondary | ICD-10-CM

## 2011-05-14 DIAGNOSIS — Z853 Personal history of malignant neoplasm of breast: Secondary | ICD-10-CM

## 2011-06-17 ENCOUNTER — Encounter (INDEPENDENT_AMBULATORY_CARE_PROVIDER_SITE_OTHER): Payer: Self-pay | Admitting: General Surgery

## 2011-06-25 ENCOUNTER — Encounter (HOSPITAL_BASED_OUTPATIENT_CLINIC_OR_DEPARTMENT_OTHER): Payer: BC Managed Care – PPO | Admitting: Oncology

## 2011-06-25 ENCOUNTER — Other Ambulatory Visit: Payer: Self-pay | Admitting: Oncology

## 2011-06-25 DIAGNOSIS — Z171 Estrogen receptor negative status [ER-]: Secondary | ICD-10-CM

## 2011-06-25 DIAGNOSIS — C50419 Malignant neoplasm of upper-outer quadrant of unspecified female breast: Secondary | ICD-10-CM

## 2011-06-25 LAB — CBC WITH DIFFERENTIAL/PLATELET
BASO%: 0.3 % (ref 0.0–2.0)
Basophils Absolute: 0 10*3/uL (ref 0.0–0.1)
EOS%: 2.2 % (ref 0.0–7.0)
Eosinophils Absolute: 0.1 10*3/uL (ref 0.0–0.5)
HCT: 41.4 % (ref 34.8–46.6)
HGB: 14.2 g/dL (ref 11.6–15.9)
LYMPH%: 35.6 % (ref 14.0–49.7)
MCH: 30.1 pg (ref 25.1–34.0)
MCHC: 34.3 g/dL (ref 31.5–36.0)
MCV: 87.7 fL (ref 79.5–101.0)
MONO#: 0.2 10*3/uL (ref 0.1–0.9)
MONO%: 6.5 % (ref 0.0–14.0)
NEUT#: 2 10*3/uL (ref 1.5–6.5)
NEUT%: 55.4 % (ref 38.4–76.8)
Platelets: 189 10*3/uL (ref 145–400)
RBC: 4.72 10*6/uL (ref 3.70–5.45)
RDW: 12.9 % (ref 11.2–14.5)
WBC: 3.7 10*3/uL — ABNORMAL LOW (ref 3.9–10.3)
lymph#: 1.3 10*3/uL (ref 0.9–3.3)

## 2011-06-25 LAB — COMPREHENSIVE METABOLIC PANEL
ALT: 15 U/L (ref 0–35)
AST: 24 U/L (ref 0–37)
Albumin: 4.3 g/dL (ref 3.5–5.2)
Alkaline Phosphatase: 77 U/L (ref 39–117)
BUN: 13 mg/dL (ref 6–23)
CO2: 27 mEq/L (ref 19–32)
Calcium: 9.5 mg/dL (ref 8.4–10.5)
Chloride: 107 mEq/L (ref 96–112)
Creatinine, Ser: 0.92 mg/dL (ref 0.50–1.10)
Glucose, Bld: 92 mg/dL (ref 70–99)
Potassium: 3.9 mEq/L (ref 3.5–5.3)
Sodium: 142 mEq/L (ref 135–145)
Total Bilirubin: 0.8 mg/dL (ref 0.3–1.2)
Total Protein: 6.7 g/dL (ref 6.0–8.3)

## 2011-06-25 LAB — LIPID PANEL
Cholesterol: 192 mg/dL (ref 0–200)
HDL: 61 mg/dL (ref >39)
LDL Cholesterol: 120 mg/dL — ABNORMAL HIGH (ref 0–99)
Total CHOL/HDL Ratio: 3.1 Ratio
Triglycerides: 56 mg/dL (ref <150)
VLDL: 11 mg/dL (ref 0–40)

## 2011-06-25 LAB — CANCER ANTIGEN 27.29: CA 27.29: 25 U/mL (ref 0–39)

## 2011-06-25 LAB — VITAMIN D 25 HYDROXY (VIT D DEFICIENCY, FRACTURES): Vit D, 25-Hydroxy: 42 ng/mL (ref 30–89)

## 2011-06-26 ENCOUNTER — Ambulatory Visit
Admission: RE | Admit: 2011-06-26 | Discharge: 2011-06-26 | Disposition: A | Discharge status: Home / Self Care | Payer: BC Managed Care – PPO | Source: Ambulatory Visit | Attending: Oncology | Admitting: Oncology

## 2011-06-26 DIAGNOSIS — Z9889 Other specified postprocedural states: Secondary | ICD-10-CM

## 2011-06-26 DIAGNOSIS — Z853 Personal history of malignant neoplasm of breast: Secondary | ICD-10-CM

## 2011-07-02 ENCOUNTER — Encounter (HOSPITAL_BASED_OUTPATIENT_CLINIC_OR_DEPARTMENT_OTHER): Payer: BC Managed Care – PPO | Admitting: Oncology

## 2011-07-02 DIAGNOSIS — Z853 Personal history of malignant neoplasm of breast: Secondary | ICD-10-CM

## 2011-07-02 DIAGNOSIS — Z923 Personal history of irradiation: Secondary | ICD-10-CM

## 2011-11-19 ENCOUNTER — Encounter (INDEPENDENT_AMBULATORY_CARE_PROVIDER_SITE_OTHER): Payer: Self-pay | Admitting: General Surgery

## 2012-07-11 ENCOUNTER — Other Ambulatory Visit: Payer: Self-pay | Admitting: Internal Medicine

## 2012-07-11 DIAGNOSIS — Z1231 Encounter for screening mammogram for malignant neoplasm of breast: Secondary | ICD-10-CM

## 2012-07-20 ENCOUNTER — Encounter: Payer: Self-pay | Admitting: Sports Medicine

## 2012-07-20 ENCOUNTER — Ambulatory Visit (INDEPENDENT_AMBULATORY_CARE_PROVIDER_SITE_OTHER): Payer: BC Managed Care – PPO | Admitting: Sports Medicine

## 2012-07-20 VITALS — BP 127/85 | HR 75 | Ht 66.5 in | Wt 140.0 lb

## 2012-07-20 DIAGNOSIS — M23302 Other meniscus derangements, unspecified lateral meniscus, unspecified knee: Secondary | ICD-10-CM | POA: Insufficient documentation

## 2012-07-20 NOTE — Assessment & Plan Note (Addendum)
Patient has what appears to be a degenerative lateral meniscal tear likely causing her symptoms. Patient given exercises to do a regular basis and told to avoid any squatting greater than 45. Patient will be coming back and be wearing a knee compression sleeve. Patient can take anti-inflammatories as needed and discussed cryotherapy which could be beneficial. Patient given sports insoles to try to help with the amount of impact that is causing her knee pain as well. Patient to come back in 4 weeks for reevaluation.

## 2012-07-20 NOTE — Progress Notes (Signed)
Chief complaint: Left knee pain  History of present illness: The patient states that she has had left knee pain that has been significant for the last 2 months. Patient does not remember any specific injury to it but seemed to give her a lot of stiffness in the mornings. Patient has noticed that she is unable to bend it with her yoga on a regular basis. Patient denies any clicking popping or giving out on her. Patient states the pain is on the lateral aspect pulses a severe constant ache that is worse with certain sharp movements. Patient has tried to change her activities of daily living to try to not have it hurt as much and has not had any great benefit. Patient though was able to do it only about 2 weeks ago did times of walking and seemed to do very well. Patient has been doing some physical therapy for the left knee as well it was given to her by her primary care physician and has noticed no improvement per se. Denies any swelling denies any radiation denies any back pain out of the ordinary  Past medical history significant for breast cancer that she is in remission over the course of the last 6 years.  Past surgical history: Bilateral mastectomy  Social history: Patient does not smoke does not use alcohol  is employed  Allergies: No known drug allergies   Medications: Unremarkable  Physical exam: Blood pressure 127/85, pulse 75, height 5' 6.5" (1.689 m), weight 140 lb (63.504 kg). General: No apparent distress alert and oriented x3 mood and affect normal Respiratory: Patient's recent full senses and does not appear short of breath Skin: Warm dry intact with no signs of infection or rash Knee: Left Normal to inspection with no erythema or effusion or obvious bony abnormalities. Palpation normal with no warmth But lateral joint line tenderness No patellar tenderness or condyle tenderness. ROM normal in flexion and extension and lower leg rotation. Ligaments with solid consistent  endpoints including ACL, PCL, LCL, MCL. Negative Mcmurray's but positive Thessaly meniscal tests. Non painful patellar compression. Patellar and quadriceps tendons unremarkable. Hamstring and quadriceps strength is normal.  [facility] ultrasound was done and interpreted by me today. Patient has what appears to be a degenerative tear in the lateral meniscus and anterior medial aspects. This seems to incorporate approximately 40% of the lateral meniscus. There is minimal hypoechoic changes surrounding it. There is no neovascularization seen on ultrasound today. Patient though does have significant hypoechoic changes in the suprapatellar pouch and that she does get effusion from time to time. Medial meniscus is unremarkable.

## 2012-07-20 NOTE — Patient Instructions (Signed)
Very nice to meet you.  We are giving you a compression sleeve but we need a medium.  Please come back later in the week and we will get it for you.  You can still do Yoga but try not to bend the knee more than 45 degrees  Please do the strength exercises focusing on the hip abductors and quad and hamstring.  You also could cross train with a bicycle and or elliptical to take some pressure off the meniscus.  Come back again in 4-6 weeks and we will make sure you are doing better.

## 2012-07-27 ENCOUNTER — Other Ambulatory Visit: Payer: Self-pay | Admitting: Obstetrics & Gynecology

## 2012-07-27 DIAGNOSIS — R6889 Other general symptoms and signs: Secondary | ICD-10-CM

## 2012-07-28 ENCOUNTER — Other Ambulatory Visit: Payer: Self-pay | Admitting: Obstetrics & Gynecology

## 2012-07-28 ENCOUNTER — Telehealth: Payer: Self-pay | Admitting: *Deleted

## 2012-07-28 DIAGNOSIS — R6889 Other general symptoms and signs: Secondary | ICD-10-CM

## 2012-07-28 NOTE — Telephone Encounter (Signed)
Lauren Calhoun called to this RN wanting to get " a second opinion from someone who knows cancer ".  Lauren Calhoun states she obtained lab work for her yearly GYN visit- at the lab draw the tech said " do you want a CA125 done ?' Lauren Calhoun inquired what the test was for and then tech informed her it was for Lauren Calhoun's who have had cancer " Lauren Calhoun said yes ( note she was thinking it was the same as the test we did ).  At visit today with Dr Leda Quail test result of CA 125 was elevated at 229. Lauren Calhoun has been scheduled for a CT of abd/pelvis tomorrow per result.  This RN clarified with Lauren Calhoun tumor marker we have obtained is a Ca 27.29 particular to breast ca. Test she obtained CA 125 is particular to ovarian cancer and due to noted elevation she would need to either have a recheck or proceed to work up.  Per discussion Lauren Calhoun stated " you have answered my question and I will proceed with the test ...and I know you will understand when I say I hope I don't see you soon ".  Verbal support given to patient per above as well as this note will be given to MD.

## 2012-07-29 ENCOUNTER — Ambulatory Visit (HOSPITAL_COMMUNITY): Admission: RE | Admit: 2012-07-29 | Payer: BC Managed Care – PPO | Source: Ambulatory Visit

## 2012-07-29 ENCOUNTER — Inpatient Hospital Stay: Admission: RE | Admit: 2012-07-29 | Payer: BC Managed Care – PPO | Source: Ambulatory Visit

## 2012-07-29 ENCOUNTER — Ambulatory Visit
Admission: RE | Admit: 2012-07-29 | Discharge: 2012-07-29 | Disposition: A | Discharge status: Home / Self Care | Payer: BC Managed Care – PPO | Source: Ambulatory Visit | Attending: Obstetrics & Gynecology | Admitting: Obstetrics & Gynecology

## 2012-07-29 DIAGNOSIS — R6889 Other general symptoms and signs: Secondary | ICD-10-CM

## 2012-07-29 MED ORDER — IOHEXOL 300 MG/ML  SOLN
100.0000 mL | Freq: Once | INTRAMUSCULAR | Status: AC | PRN
  Administered 2012-07-29: 100 mL via INTRAVENOUS

## 2012-07-30 ENCOUNTER — Other Ambulatory Visit: Payer: Self-pay | Admitting: Oncology

## 2012-08-05 ENCOUNTER — Ambulatory Visit: Payer: BC Managed Care – PPO | Attending: Gynecology | Admitting: Gynecology

## 2012-08-05 ENCOUNTER — Encounter: Payer: Self-pay | Admitting: Gynecology

## 2012-08-05 VITALS — BP 120/80 | HR 60 | Temp 97.6°F | Resp 18 | Ht 65.91 in | Wt 140.2 lb

## 2012-08-05 DIAGNOSIS — R1900 Intra-abdominal and pelvic swelling, mass and lump, unspecified site: Secondary | ICD-10-CM | POA: Insufficient documentation

## 2012-08-05 DIAGNOSIS — R971 Elevated cancer antigen 125 [CA 125]: Secondary | ICD-10-CM | POA: Insufficient documentation

## 2012-08-05 DIAGNOSIS — C50919 Malignant neoplasm of unspecified site of unspecified female breast: Secondary | ICD-10-CM

## 2012-08-05 NOTE — Progress Notes (Signed)
Consult Note: Gyn-Onc   Lauren Calhoun 61 y.o. female  Chief Complaint  Patient presents with  . Pelvic mass    New Consult      HPI: 61 year old white married female seen in consultation request of Dr. Leda Quail regarding newly diagnosed bilateral ovarian neoplasms and an elevated CA 125. On routine laboratory testing a CA 125 was obtained and returned as 229 units per mL. Subsequent CT scan was obtained showing bilateral ovarian masses. The right measured 5.5 x 5.5 x 6.3 cm in the left measured 6.3 x 5.1 x 5.1 cm. There is no evidence of free fluid and only minimal prominence of a lymph node in the right lower quadrant. There's no other adenopathy and no other evidence of metastatic disease. The patient has a remote history of breast cancer.  Family history may be significant in that her mother died at age 16 of uterine or cervix cancer, a sister at age 47 of breast cancer, and a maternal aunt may have had ovarian cancer.  The patient is self not really having any symptoms. She's she specifically denies any GI or GU symptoms has no pelvic pain pressure vaginal bleeding or discharge. She is menopausal and not taking any hormone replacement therapy. Review of Systems:10 point review of systems is negative as noted above.   Vitals: Blood pressure 120/80, pulse 60, temperature 97.6 F (36.4 C), temperature source Oral, resp. rate 18, height 5' 5.91" (1.674 m), weight 140 lb 3.2 oz (63.594 kg).  Physical Exam: General : The patient is a healthy woman in no acute distress.  HEENT: normocephalic, extraoccular movements normal; neck is supple without thyromegally  Lynphnodes: Supraclavicular and inguinal nodes not enlarged  Abdomen: Soft, non-tender, no ascites, no organomegally, no masses, no hernias  Pelvic:  EGBUS: Normal female  Vagina: Normal, no lesions  Urethra and Bladder: Normal, non-tender  Cervix:  Normal Uterus: Anterior normal shape size and consistency Bi-manual  examination: Non-tender; there is fullness behind the uterus although am unable to fully delineated the size of the mass. Rectal: normal sphincter tone, no masses, no blood  Lower extremities: No edema or varicosities. Normal range of motion    Assessment/Plan: Bilateral ovarian masses with elevated CA 125. It is most likely the patient has a new primary ovarian cancer although metastatic disease from the breast cancer is a possibility.  I would recommend the patient undergo exploratory laparotomy through midline incision with intraoperative frozen section. Patient wishes to preserve her uterus if these are benign masses. On the other hand she understands if she has invasive ovarian cancer we will complete a hysterectomy as well as further staging including pelvic and periaortic lymphadenectomy, omentectomy, and multiple peritoneal biopsies.  She wishes to have some time to rearranged her work schedule and we will therefore schedule surgery for Tuesday, 08/23/2012. Patient had a number questions regarding sexual function after surgery, the cost of surgery and chemotherapy, and also wishes to have her specimen undergo vallecular profiling by the [employer].  No Known Allergies  History reviewed. No pertinent past medical history.  Past Surgical History  Procedure Date  . Breast lumpectomy 2005    RIGHT  . Appendectomy AGE 15    No current outpatient prescriptions on file.    History   Social History  . Marital Status: Married    Spouse Name: N/A    Number of Children: N/A  . Years of Education: N/A   Occupational History  . Not on file.   Social  History Main Topics  . Smoking status: Never Smoker   . Smokeless tobacco: Never Used  . Alcohol Use: Yes     RARE MAYBE 2X MONTH  . Drug Use: No  . Sexually Active: Yes   Other Topics Concern  . Not on file   Social History Narrative  . No narrative on file    Family History  Problem Relation Age of Onset  . Cancer  Mother     UTERINE AGE 67  . Cancer Sister     BREAST AGE 89  . Cancer Maternal Aunt     Ovarian cancer      CLARKE-PEARSON,Tenesha Garza L, MD 08/05/2012, 9:47 AM

## 2012-08-05 NOTE — Patient Instructions (Signed)
Surgery will be scheduled for November 19. Preoperative workup will be scheduled prior to that time.

## 2012-08-10 ENCOUNTER — Ambulatory Visit: Payer: BC Managed Care – PPO

## 2012-08-17 ENCOUNTER — Encounter: Payer: Self-pay | Admitting: Sports Medicine

## 2012-08-17 ENCOUNTER — Encounter (HOSPITAL_COMMUNITY): Payer: Self-pay | Admitting: Pharmacist

## 2012-08-17 ENCOUNTER — Ambulatory Visit (INDEPENDENT_AMBULATORY_CARE_PROVIDER_SITE_OTHER): Payer: BC Managed Care – PPO | Admitting: Sports Medicine

## 2012-08-17 VITALS — BP 118/78 | HR 56 | Ht 66.0 in | Wt 140.0 lb

## 2012-08-17 DIAGNOSIS — M23302 Other meniscus derangements, unspecified lateral meniscus, unspecified knee: Secondary | ICD-10-CM

## 2012-08-17 NOTE — Progress Notes (Signed)
Need orders.

## 2012-08-17 NOTE — Assessment & Plan Note (Signed)
Improving  See progress note  We will re-evaluate p her surgery

## 2012-08-17 NOTE — Progress Notes (Signed)
Patient ID: Lauren Calhoun, female   DOB: 1951/07/16, 61 y.o.   MRN: [id number]  SUBJECTIVE: Lauren Calhoun is a 61 y.o. female presenting for f/u of LEFT lateral meniscus tear.  Of note, recent change in medical history to include discovery of bilateral ovarian masses via pelvic CT following elevated CA-125 (value 229 units / mL) on routine screening.  No other adenopathy or evidence of of metastatic disease at this time per GYN-ONC but patient will undergo exploratory laparotomy via midline incision for intraoperative frozen pathology eval on 23-Aug-2012.  If malignant plan for complete hysterectomy with oophorectomy and lymnph node staging.    Given the above she admits that she has not focused on her knee exercises as much as she should have, understandably so.  Patient reports performing exercises every 2-3 days and walking/running 3 miles every 2-3 days.  She performs knee exercises on days when she does not walk/run.  Reports feeling 40% improved with respect to knee.  Still has difficulty with stairs or exercises requiring lots of knee flexion.  Denies any specific locking, catching or instability but she does have occasional popping. Overall not as painful compared to previously.  She stopped body-helix after 2 uses as this aggravated her baseline neuropathy of LEFT lower leg.  She still feels like her gait is awkward and she is placing excess pressure on LEFT hip and right leg through compensation.  ROS otherwise negative as relating to LEFT knee pain.   OBJECTIVE: Vital signs as noted above; all WNL. Appearance --> alert, well-appearing, fit female in NAD  Knee exam: - No gross abnormalities on inspection - Full, symmetric AROM and PROM of bilateral knees in flexion and extension - Some pain at terminal active and passive knee flexion in posterior-medial aspect of knee - 5/5 strength in knee flexion and extension, some pain along posterior-lateral aspect of knee during resisted knee  flexion, likely from compensatory strain on biceps femoris - No joint line tenderness on palpation - Patellar tendon, patella and femoral condyles also non-tender to palpation - Positive McMurray's on LEFT knee on lateral aspect for popping, clicking but no locking - Did not perform thessaly - Negative Lachman's - Negative Anterior/Posterior drawer - Negative Valgus and Varus stress testing for instability, good firm end-point.  Some pain with valgus testing at 0 degrees of LEFT knee, none at 30   ASSESSMENT: 1. Lateral meniscus derangement, LEFT  PLAN: Patient appears to have recovering, healing LEFT lateral meniscus tear which has responded well to knee exercises, continued ice and conservative treatment.  She was unable to tolerate body-helix sleeve secondary to worsening baseline neuropathy.  Discussed with patient that she is on course and healing well, especially with only significant problem being stairs and terminal aspect of flexion.  She will continue knee exercises until operation on 23-August-2012.  In post-operative period we encouraged her to continue isometric strengthening of quadriceps and hip abductors, starting immediately.  Encouraged walking and use of knee as soon as possible in post-operative period.  She should continue avoiding squatting > 45 degrees.  Anti-inflammatory could be helpful, which she can continue using oral prn.    She will take care of primary problem of possible ovarian cancer and follow-up in her post-operative period as needed.  Expressed our staff's support and concern for her during this difficult time.  We will try to keep in touch with her and she will let us know how she is doing post-op.  -- Kenney Houseman, MS IV

## 2012-08-19 ENCOUNTER — Encounter (HOSPITAL_COMMUNITY): Payer: Self-pay

## 2012-08-19 ENCOUNTER — Ambulatory Visit (HOSPITAL_COMMUNITY)
Admission: RE | Admit: 2012-08-19 | Discharge: 2012-08-19 | Disposition: A | Discharge status: Home / Self Care | Payer: BC Managed Care – PPO | Source: Ambulatory Visit | Attending: Gynecology | Admitting: Gynecology

## 2012-08-19 ENCOUNTER — Encounter (HOSPITAL_COMMUNITY)
Admission: RE | Admit: 2012-08-19 | Discharge: 2012-08-19 | Disposition: A | Discharge status: Home / Self Care | Payer: BC Managed Care – PPO | Source: Ambulatory Visit | Attending: Obstetrics & Gynecology | Admitting: Obstetrics & Gynecology

## 2012-08-19 DIAGNOSIS — N839 Noninflammatory disorder of ovary, fallopian tube and broad ligament, unspecified: Secondary | ICD-10-CM | POA: Insufficient documentation

## 2012-08-19 DIAGNOSIS — C50919 Malignant neoplasm of unspecified site of unspecified female breast: Secondary | ICD-10-CM | POA: Insufficient documentation

## 2012-08-19 DIAGNOSIS — Z01812 Encounter for preprocedural laboratory examination: Secondary | ICD-10-CM | POA: Insufficient documentation

## 2012-08-19 HISTORY — DX: Unspecified tear of unspecified meniscus, current injury, unspecified knee, initial encounter: S83.209A

## 2012-08-19 HISTORY — DX: Malignant (primary) neoplasm, unspecified: C80.1

## 2012-08-19 HISTORY — DX: Chronic kidney disease, unspecified: N18.9

## 2012-08-19 LAB — COMPREHENSIVE METABOLIC PANEL
ALT: 16 U/L (ref 0–35)
AST: 21 U/L (ref 0–37)
Albumin: 3.9 g/dL (ref 3.5–5.2)
Alkaline Phosphatase: 70 U/L (ref 39–117)
BUN: 17 mg/dL (ref 6–23)
CO2: 30 mEq/L (ref 19–32)
Calcium: 9.7 mg/dL (ref 8.4–10.5)
Chloride: 106 mEq/L (ref 96–112)
Creatinine, Ser: 0.95 mg/dL (ref 0.50–1.10)
GFR calc Af Amer: 74 mL/min — ABNORMAL LOW (ref >90)
GFR calc non Af Amer: 64 mL/min — ABNORMAL LOW (ref >90)
Glucose, Bld: 93 mg/dL (ref 70–99)
Potassium: 4.1 mEq/L (ref 3.5–5.1)
Sodium: 142 mEq/L (ref 135–145)
Total Bilirubin: 0.6 mg/dL (ref 0.3–1.2)
Total Protein: 6.8 g/dL (ref 6.0–8.3)

## 2012-08-19 LAB — CBC WITH DIFFERENTIAL/PLATELET
Basophils Absolute: 0 10*3/uL (ref 0.0–0.1)
Basophils Relative: 0 % (ref 0–1)
Eosinophils Absolute: 0.1 10*3/uL (ref 0.0–0.7)
Eosinophils Relative: 2 % (ref 0–5)
HCT: 43.1 % (ref 36.0–46.0)
Hemoglobin: 14.8 g/dL (ref 12.0–15.0)
Lymphocytes Relative: 34 % (ref 12–46)
Lymphs Abs: 1.4 10*3/uL (ref 0.7–4.0)
MCH: 30.2 pg (ref 26.0–34.0)
MCHC: 34.3 g/dL (ref 30.0–36.0)
MCV: 88 fL (ref 78.0–100.0)
Monocytes Absolute: 0.3 10*3/uL (ref 0.1–1.0)
Monocytes Relative: 7 % (ref 3–12)
Neutro Abs: 2.4 10*3/uL (ref 1.7–7.7)
Neutrophils Relative %: 57 % (ref 43–77)
Platelets: 205 10*3/uL (ref 150–400)
RBC: 4.9 MIL/uL (ref 3.87–5.11)
RDW: 13 % (ref 11.5–15.5)
WBC: 4.2 10*3/uL (ref 4.0–10.5)

## 2012-08-19 LAB — SURGICAL PCR SCREEN
MRSA, PCR: NEGATIVE
Staphylococcus aureus: NEGATIVE

## 2012-08-19 LAB — ABO/RH: ABO/RH(D): A POS

## 2012-08-19 NOTE — Progress Notes (Signed)
CXR done 08-19-2012 in Epic

## 2012-08-19 NOTE — Patient Instructions (Signed)
20 Lauren Calhoun  08/19/2012   Your procedure is scheduled on:  Tuesday 08-23-2012  Report to [facility] at  [phone]  Call this number if you have problems the morning of surgery: [phone]   Remember: Complete bowel Monday 08-22-2012   Do not drink liquids or  eat food:After Midnight. Monday 08-22-2012      Take these medicines the morning of surgery with A SIP OF WATER: None   Do not wear jewelry, make-up or nail polish.  Do not wear lotions, powders, or perfumes. You may wear deodorant.  Do not shave 48 hours prior to surgery. Men may shave face and neck.  Do not bring valuables to the hospital.  Contacts, dentures or bridgework may not be worn into surgery.  Leave suitcase in the car. After surgery it may be brought to your room.  For patients admitted to the hospital, checkout time is 11:00 AM the day of discharge.   Patients discharged the day of surgery will not be allowed to drive home.  Name and phone number of your driver: Randy [phone]  See [facility] Preparing for surgery sheet.   Please read over the following fact sheets that you were given: MRSA Information,Blood transfusion

## 2012-08-22 NOTE — Anesthesia Preprocedure Evaluation (Addendum)
Anesthesia Evaluation  Patient identified by MRN, date of birth, ID band Patient awake    Reviewed: Allergy & Precautions, H&P , NPO status , Patient's Chart, lab work & pertinent test results  Airway Mallampati: II TM Distance: >3 FB Neck ROM: Full    Dental  (+) Teeth Intact and Dental Advisory Given   Pulmonary neg pulmonary ROS,  breath sounds clear to auscultation  Pulmonary exam normal       Cardiovascular negative cardio ROS  Rhythm:Regular Rate:Normal     Neuro/Psych negative neurological ROS  negative psych ROS   GI/Hepatic negative GI ROS, Neg liver ROS,   Endo/Other  negative endocrine ROS  Renal/GU Renal diseasenegative Renal ROS  negative genitourinary   Musculoskeletal negative musculoskeletal ROS (+)   Abdominal   Peds  Hematology negative hematology ROS (+)   Anesthesia Other Findings   Reproductive/Obstetrics negative OB ROS Hx Breast CA                           Anesthesia Physical Anesthesia Plan  ASA: I  Anesthesia Plan: General   Post-op Pain Management:    Induction: Intravenous  Airway Management Planned: Oral ETT  Additional Equipment:   Intra-op Plan:   Post-operative Plan: Extubation in OR  Informed Consent: I have reviewed the patients History and Physical, chart, labs and discussed the procedure including the risks, benefits and alternatives for the proposed anesthesia with the patient or authorized representative who has indicated his/her understanding and acceptance.   Dental advisory given  Plan Discussed with: CRNA  Anesthesia Plan Comments:        Anesthesia Quick Evaluation

## 2012-08-23 ENCOUNTER — Encounter (HOSPITAL_COMMUNITY): Payer: Self-pay | Admitting: Anesthesiology

## 2012-08-23 ENCOUNTER — Encounter (HOSPITAL_COMMUNITY)
Admission: RE | Disposition: A | Discharge status: Home / Self Care | Payer: Self-pay | Source: Ambulatory Visit | Attending: Obstetrics & Gynecology

## 2012-08-23 ENCOUNTER — Inpatient Hospital Stay (HOSPITAL_COMMUNITY): Payer: BC Managed Care – PPO | Admitting: Anesthesiology

## 2012-08-23 ENCOUNTER — Encounter (HOSPITAL_COMMUNITY): Payer: Self-pay | Admitting: *Deleted

## 2012-08-23 ENCOUNTER — Inpatient Hospital Stay (HOSPITAL_COMMUNITY)
Admission: RE | Admit: 2012-08-23 | Discharge: 2012-08-25 | DRG: 357 | Disposition: A | Discharge status: Home / Self Care | Payer: BC Managed Care – PPO | Source: Ambulatory Visit | Attending: Obstetrics & Gynecology | Admitting: Obstetrics & Gynecology

## 2012-08-23 DIAGNOSIS — Z853 Personal history of malignant neoplasm of breast: Secondary | ICD-10-CM

## 2012-08-23 DIAGNOSIS — R971 Elevated cancer antigen 125 [CA 125]: Secondary | ICD-10-CM

## 2012-08-23 DIAGNOSIS — R1900 Intra-abdominal and pelvic swelling, mass and lump, unspecified site: Secondary | ICD-10-CM

## 2012-08-23 DIAGNOSIS — D3910 Neoplasm of uncertain behavior of unspecified ovary: Principal | ICD-10-CM | POA: Diagnosis present

## 2012-08-23 DIAGNOSIS — Z8049 Family history of malignant neoplasm of other genital organs: Secondary | ICD-10-CM

## 2012-08-23 DIAGNOSIS — Z9010 Acquired absence of unspecified breast and nipple: Secondary | ICD-10-CM

## 2012-08-23 DIAGNOSIS — K669 Disorder of peritoneum, unspecified: Secondary | ICD-10-CM | POA: Diagnosis present

## 2012-08-23 HISTORY — PX: SALPINGOOPHORECTOMY: SHX82

## 2012-08-23 HISTORY — PX: OMENTECTOMY: SHX5985

## 2012-08-23 HISTORY — PX: ABDOMINAL HYSTERECTOMY: SHX81

## 2012-08-23 HISTORY — PX: LYMPHADENECTOMY: SHX5960

## 2012-08-23 LAB — TYPE AND SCREEN
ABO/RH(D): A POS
Antibody Screen: NEGATIVE

## 2012-08-23 SURGERY — SALPINGO-OOPHORECTOMY, OPEN
Anesthesia: General | Wound class: Clean Contaminated

## 2012-08-23 MED ORDER — DIPHENHYDRAMINE HCL 50 MG/ML IJ SOLN: 12.5000 mg | Freq: Four times a day (QID) | INTRAMUSCULAR | Status: DC | PRN

## 2012-08-23 MED ORDER — HYDROMORPHONE HCL PF 1 MG/ML IJ SOLN
INTRAMUSCULAR | Status: DC | PRN
  Administered 2012-08-23 (×2): 1 mg via INTRAVENOUS

## 2012-08-23 MED ORDER — ACETAMINOPHEN 10 MG/ML IV SOLN
INTRAVENOUS | Status: DC | PRN
  Administered 2012-08-23: 1000 mg via INTRAVENOUS

## 2012-08-23 MED ORDER — HYDROMORPHONE HCL PF 1 MG/ML IJ SOLN: 0.2500 mg | INTRAMUSCULAR | Status: DC | PRN

## 2012-08-23 MED ORDER — ZOLPIDEM TARTRATE 5 MG PO TABS: 5.0000 mg | ORAL_TABLET | Freq: Every evening | ORAL | Status: DC | PRN

## 2012-08-23 MED ORDER — ACETAMINOPHEN 10 MG/ML IV SOLN
1000.0000 mg | Freq: Four times a day (QID) | INTRAVENOUS | Status: AC
  Administered 2012-08-23 (×3): 1000 mg via INTRAVENOUS
  Filled 2012-08-23 (×6): qty 100

## 2012-08-23 MED ORDER — MAGNESIUM HYDROXIDE 400 MG/5ML PO SUSP
30.0000 mL | Freq: Three times a day (TID) | ORAL | Status: AC
  Administered 2012-08-23 – 2012-08-24 (×3): 30 mL via ORAL
  Filled 2012-08-23 (×3): qty 30

## 2012-08-23 MED ORDER — ONDANSETRON HCL 4 MG/2ML IJ SOLN: 4.0000 mg | Freq: Four times a day (QID) | INTRAMUSCULAR | Status: DC | PRN

## 2012-08-23 MED ORDER — CISATRACURIUM BESYLATE (PF) 10 MG/5ML IV SOLN
INTRAVENOUS | Status: DC | PRN
  Administered 2012-08-23: 2 mg via INTRAVENOUS
  Administered 2012-08-23: 10 mg via INTRAVENOUS
  Administered 2012-08-23: 2 mg via INTRAVENOUS

## 2012-08-23 MED ORDER — SODIUM CHLORIDE 0.9 % IJ SOLN: 9.0000 mL | INTRAMUSCULAR | Status: DC | PRN

## 2012-08-23 MED ORDER — OXYCODONE-ACETAMINOPHEN 5-325 MG PO TABS
1.0000 | ORAL_TABLET | ORAL | Status: DC | PRN
  Administered 2012-08-24 – 2012-08-25 (×8): 1 via ORAL
  Filled 2012-08-23 (×2): qty 2
  Filled 2012-08-23 (×4): qty 1
  Filled 2012-08-23: qty 2
  Filled 2012-08-23 (×2): qty 1

## 2012-08-23 MED ORDER — HYDROMORPHONE 0.3 MG/ML IV SOLN
INTRAVENOUS | Status: DC
  Administered 2012-08-23: 0.2 mg via INTRAVENOUS
  Administered 2012-08-23: 0.999 mg via INTRAVENOUS
  Administered 2012-08-23: 1.79 mg via INTRAVENOUS
  Administered 2012-08-23: 11:00:00 via INTRAVENOUS
  Administered 2012-08-23: 0.2 mg via INTRAVENOUS
  Administered 2012-08-24: 1.19 mg via INTRAVENOUS
  Administered 2012-08-24: 1.999 mg via INTRAVENOUS
  Administered 2012-08-24: 09:00:00 via INTRAVENOUS
  Filled 2012-08-23: qty 25

## 2012-08-23 MED ORDER — ENOXAPARIN SODIUM 40 MG/0.4ML ~~LOC~~ SOLN
40.0000 mg | SUBCUTANEOUS | Status: AC
  Administered 2012-08-23: 40 mg via SUBCUTANEOUS
  Filled 2012-08-23: qty 0.4

## 2012-08-23 MED ORDER — GLYCOPYRROLATE 0.2 MG/ML IJ SOLN
INTRAMUSCULAR | Status: DC | PRN
  Administered 2012-08-23: 0.2 mg via INTRAVENOUS
  Administered 2012-08-23: .8 mg via INTRAVENOUS

## 2012-08-23 MED ORDER — 0.9 % SODIUM CHLORIDE (POUR BTL) OPTIME
TOPICAL | Status: DC | PRN
  Administered 2012-08-23: 3000 mL

## 2012-08-23 MED ORDER — NALOXONE HCL 0.4 MG/ML IJ SOLN: 0.4000 mg | INTRAMUSCULAR | Status: DC | PRN

## 2012-08-23 MED ORDER — DEXAMETHASONE SODIUM PHOSPHATE 10 MG/ML IJ SOLN
INTRAMUSCULAR | Status: DC | PRN
  Administered 2012-08-23: 10 mg via INTRAVENOUS

## 2012-08-23 MED ORDER — PROMETHAZINE HCL 25 MG/ML IJ SOLN
6.2500 mg | INTRAMUSCULAR | Status: AC | PRN
  Administered 2012-08-23 (×2): 6.25 mg via INTRAVENOUS

## 2012-08-23 MED ORDER — KCL IN DEXTROSE-NACL 20-5-0.45 MEQ/L-%-% IV SOLN
INTRAVENOUS | Status: DC
  Administered 2012-08-23 – 2012-08-24 (×3): via INTRAVENOUS
  Filled 2012-08-23 (×4): qty 1000

## 2012-08-23 MED ORDER — MIDAZOLAM HCL 5 MG/5ML IJ SOLN
INTRAMUSCULAR | Status: DC | PRN
  Administered 2012-08-23 (×2): 1 mg via INTRAVENOUS

## 2012-08-23 MED ORDER — ONDANSETRON HCL 4 MG/2ML IJ SOLN
INTRAMUSCULAR | Status: DC | PRN
  Administered 2012-08-23 (×2): 2 mg via INTRAVENOUS

## 2012-08-23 MED ORDER — ONDANSETRON HCL 4 MG PO TABS: 4.0000 mg | ORAL_TABLET | Freq: Four times a day (QID) | ORAL | Status: DC | PRN

## 2012-08-23 MED ORDER — CEFAZOLIN SODIUM-DEXTROSE 2-3 GM-% IV SOLR
2.0000 g | INTRAVENOUS | Status: AC
  Administered 2012-08-23: 2 g via INTRAVENOUS

## 2012-08-23 MED ORDER — DIPHENHYDRAMINE HCL 12.5 MG/5ML PO ELIX: 12.5000 mg | ORAL_SOLUTION | Freq: Four times a day (QID) | ORAL | Status: DC | PRN

## 2012-08-23 MED ORDER — NEOSTIGMINE METHYLSULFATE 1 MG/ML IJ SOLN
INTRAMUSCULAR | Status: DC | PRN
  Administered 2012-08-23: 3 mg via INTRAVENOUS

## 2012-08-23 MED ORDER — LACTATED RINGERS IV SOLN
INTRAVENOUS | Status: DC
  Administered 2012-08-23: 10:00:00 via INTRAVENOUS

## 2012-08-23 MED ORDER — SUFENTANIL CITRATE 50 MCG/ML IV SOLN
INTRAVENOUS | Status: DC | PRN
  Administered 2012-08-23 (×4): 5 ug via INTRAVENOUS
  Administered 2012-08-23: 10 ug via INTRAVENOUS
  Administered 2012-08-23 (×2): 5 ug via INTRAVENOUS

## 2012-08-23 MED ORDER — BUPIVACAINE LIPOSOME 1.3 % IJ SUSP
20.0000 mL | INTRAMUSCULAR | Status: AC
  Administered 2012-08-23: 20 mL
  Filled 2012-08-23: qty 20

## 2012-08-23 MED ORDER — LACTATED RINGERS IV SOLN
INTRAVENOUS | Status: DC | PRN
  Administered 2012-08-23 (×2): via INTRAVENOUS

## 2012-08-23 MED ORDER — LIDOCAINE HCL (CARDIAC) 20 MG/ML IV SOLN
INTRAVENOUS | Status: DC | PRN
  Administered 2012-08-23: 50 mg via INTRAVENOUS

## 2012-08-23 MED ORDER — PROPOFOL 10 MG/ML IV EMUL
INTRAVENOUS | Status: DC | PRN
  Administered 2012-08-23: 180 mg via INTRAVENOUS

## 2012-08-23 SURGICAL SUPPLY — 43 items
ATTRACTOMAT 16X20 MAGNETIC DRP (DRAPES) ×4 IMPLANT
BAG URINE DRAINAGE (UROLOGICAL SUPPLIES) ×3 IMPLANT
CANISTER SUCTION 2500CC (MISCELLANEOUS) ×4 IMPLANT
CHLORAPREP W/TINT 10.5 ML (MISCELLANEOUS) ×4 IMPLANT
CLIP TI MEDIUM LARGE 6 (CLIP) ×18 IMPLANT
CLOTH BEACON ORANGE TIMEOUT ST (SAFETY) ×4 IMPLANT
COVER SURGICAL LIGHT HANDLE (MISCELLANEOUS) ×4 IMPLANT
DRAPE UTILITY XL STRL (DRAPES) ×4 IMPLANT
DRAPE WARM FLUID 44X44 (DRAPE) ×4 IMPLANT
DRSG TELFA 4X14 ISLAND ADH (GAUZE/BANDAGES/DRESSINGS) ×1 IMPLANT
ELECT BLADE 6.5 EXT (BLADE) ×4 IMPLANT
ELECT REM PT RETURN 9FT ADLT (ELECTROSURGICAL) ×4
ELECTRODE REM PT RTRN 9FT ADLT (ELECTROSURGICAL) ×3 IMPLANT
GAUZE SPONGE 4X4 16PLY XRAY LF (GAUZE/BANDAGES/DRESSINGS) ×4 IMPLANT
GLOVE BIOGEL M STRL SZ7.5 (GLOVE) ×18 IMPLANT
GOWN PREVENTION PLUS LG XLONG (DISPOSABLE) ×4 IMPLANT
GOWN STRL NON-REIN LRG LVL3 (GOWN DISPOSABLE) ×4 IMPLANT
GOWN STRL REIN XL XLG (GOWN DISPOSABLE) ×5 IMPLANT
LIGASURE IMPACT 36 18CM CVD LR (INSTRUMENTS) ×1 IMPLANT
NS IRRIG 1000ML POUR BTL (IV SOLUTION) ×12 IMPLANT
PACK ABDOMINAL WL (CUSTOM PROCEDURE TRAY) ×4 IMPLANT
SEALER TISSUE X1 CVD JAW (INSTRUMENTS) IMPLANT
SHEET LAVH (DRAPES) ×4 IMPLANT
SPONGE LAP 18X18 X RAY DECT (DISPOSABLE) ×7 IMPLANT
STAPLER SKIN PROX WIDE 3.9 (STAPLE) ×4 IMPLANT
STAPLER VISISTAT 35W (STAPLE) ×1 IMPLANT
SUT ETHILON 1 LR 30 (SUTURE) IMPLANT
SUT PDS AB 1 CTXB1 36 (SUTURE) ×8 IMPLANT
SUT SILK 2 0 (SUTURE) ×4
SUT SILK 2 0 30  PSL (SUTURE)
SUT SILK 2 0 30 PSL (SUTURE) IMPLANT
SUT SILK 2-0 18XBRD TIE 12 (SUTURE) ×3 IMPLANT
SUT VIC AB 0 CT1 36 (SUTURE) ×15 IMPLANT
SUT VIC AB 2-0 CT2 27 (SUTURE) ×30 IMPLANT
SUT VIC AB 2-0 SH 27 (SUTURE) ×8
SUT VIC AB 2-0 SH 27X BRD (SUTURE) ×18 IMPLANT
SUT VIC AB 3-0 CTX 36 (SUTURE) IMPLANT
SUT VICRYL 2 0 18  UND BR (SUTURE) ×1
SUT VICRYL 2 0 18 UND BR (SUTURE) ×3 IMPLANT
TOWEL OR 17X26 10 PK STRL BLUE (TOWEL DISPOSABLE) ×4 IMPLANT
TOWEL OR NON WOVEN STRL DISP B (DISPOSABLE) ×4 IMPLANT
TRAY FOLEY CATH 14FRSI W/METER (CATHETERS) ×4 IMPLANT
WATER STERILE IRR 1500ML POUR (IV SOLUTION) ×3 IMPLANT

## 2012-08-23 NOTE — Anesthesia Postprocedure Evaluation (Signed)
Anesthesia Post Note  Patient: Lauren Calhoun  Procedure(s) Performed: Procedure(s) (LRB): SALPINGO OOPHORECTOMY (Bilateral) OMENTECTOMY () HYSTERECTOMY ABDOMINAL (N/A) LYMPHADENECTOMY ()  Anesthesia type: General  Patient location: PACU  Post pain: Pain level controlled  Post assessment: Post-op Vital signs reviewed  Last Vitals:  Filed Vitals:   08/23/12 1134  BP: 120/63  Pulse: 49  Temp: 37.1 C  Resp: 19    Post vital signs: Reviewed  Level of consciousness: sedated  Complications: No apparent anesthesia complications

## 2012-08-23 NOTE — Preoperative (Signed)
Beta Blockers   Reason not to administer Beta Blockers:Not Applicable 

## 2012-08-23 NOTE — H&P (View-Only) (Signed)
Consult Note: Gyn-Onc   Lauren Calhoun 61 y.o. female  Chief Complaint  Patient presents with  . Pelvic mass    New Consult      HPI: 61-year-old white married female seen in consultation request of Dr. Suzanne Miller regarding newly diagnosed bilateral ovarian neoplasms and an elevated CA 125. On routine laboratory testing a CA 125 was obtained and returned as 229 units per mL. Subsequent CT scan was obtained showing bilateral ovarian masses. The right measured 5.5 x 5.5 x 6.3 cm in the left measured 6.3 x 5.1 x 5.1 cm. There is no evidence of free fluid and only minimal prominence of a lymph node in the right lower quadrant. There's no other adenopathy and no other evidence of metastatic disease. The patient has a remote history of breast cancer.  Family history may be significant in that her mother died at age 52 of uterine or cervix cancer, a sister at age 49 of breast cancer, and a maternal aunt may have had ovarian cancer.  The patient is self not really having any symptoms. She's she specifically denies any GI or GU symptoms has no pelvic pain pressure vaginal bleeding or discharge. She is menopausal and not taking any hormone replacement therapy. Review of Systems:10 point review of systems is negative as noted above.   Vitals: Blood pressure 120/80, pulse 60, temperature 97.6 F (36.4 C), temperature source Oral, resp. rate 18, height 5' 5.91" (1.674 m), weight 140 lb 3.2 oz (63.594 kg).  Physical Exam: General : The patient is a healthy woman in no acute distress.  HEENT: normocephalic, extraoccular movements normal; neck is supple without thyromegally  Lynphnodes: Supraclavicular and inguinal nodes not enlarged  Abdomen: Soft, non-tender, no ascites, no organomegally, no masses, no hernias  Pelvic:  EGBUS: Normal female  Vagina: Normal, no lesions  Urethra and Bladder: Normal, non-tender  Cervix:  Normal Uterus: Anterior normal shape size and consistency Bi-manual  examination: Non-tender; there is fullness behind the uterus although am unable to fully delineated the size of the mass. Rectal: normal sphincter tone, no masses, no blood  Lower extremities: No edema or varicosities. Normal range of motion    Assessment/Plan: Bilateral ovarian masses with elevated CA 125. It is most likely the patient has a new primary ovarian cancer although metastatic disease from the breast cancer is a possibility.  I would recommend the patient undergo exploratory laparotomy through midline incision with intraoperative frozen section. Patient wishes to preserve her uterus if these are benign masses. On the other hand she understands if she has invasive ovarian cancer we will complete a hysterectomy as well as further staging including pelvic and periaortic lymphadenectomy, omentectomy, and multiple peritoneal biopsies.  She wishes to have some time to rearranged her work schedule and we will therefore schedule surgery for Tuesday, 08/23/2012. Patient had a number questions regarding sexual function after surgery, the cost of surgery and chemotherapy, and also wishes to have her specimen undergo vallecular profiling by the [employer].  No Known Allergies  History reviewed. No pertinent past medical history.  Past Surgical History  Procedure Date  . Breast lumpectomy 2005    RIGHT  . Appendectomy AGE 6    No current outpatient prescriptions on file.    History   Social History  . Marital Status: Married    Spouse Name: N/A    Number of Children: N/A  . Years of Education: N/A   Occupational History  . Not on file.   Social   History Main Topics  . Smoking status: Never Smoker   . Smokeless tobacco: Never Used  . Alcohol Use: Yes     RARE MAYBE 2X MONTH  . Drug Use: No  . Sexually Active: Yes   Other Topics Concern  . Not on file   Social History Narrative  . No narrative on file    Family History  Problem Relation Age of Onset  . Cancer  Mother     UTERINE AGE 52  . Cancer Sister     BREAST AGE 49  . Cancer Maternal Aunt     Ovarian cancer      CLARKE-PEARSON,Ameira Alessandrini L, MD 08/05/2012, 9:47 AM         

## 2012-08-23 NOTE — Transfer of Care (Signed)
Immediate Anesthesia Transfer of Care Note  Patient: Lauren Calhoun  Procedure(s) Performed: Procedure(s) (LRB) with comments: SALPINGO OOPHORECTOMY (Bilateral) OMENTECTOMY () HYSTERECTOMY ABDOMINAL (N/A) - Perineal biopsies LYMPHADENECTOMY () - Pelvic and para-aortic  Patient Location: PACU  Anesthesia Type:General  Level of Consciousness: awake, alert  and oriented  Airway & Oxygen Therapy: Patient Spontanous Breathing and Patient connected to face mask oxygen  Post-op Assessment: Report given to PACU RN, Post -op Vital signs reviewed and stable and Patient moving all extremities  Post vital signs: Reviewed and stable  Complications: No apparent anesthesia complications

## 2012-08-23 NOTE — Op Note (Signed)
Lauren Calhoun  female MEDICAL RECORD YN:[id number] DATE OF BIRTH: [birth year] PHYSICIAN: De Blanch, M.D  DATE OF PROCEDURE: 08/23/2012    OPERATIVE REPORT  PREOPERATIVE DIAGNOSIS: Bilateral complex ovarian masses  POSTOPERATIVE DIAGNOSIS: Bilateral ovarian neoplasms. On frozen section at least borderline (low malignant potential tumors)  PROCEDURE: Surgical staging of ovarian cancer including: Total abdominal hysterectomy, bilateral salpingo-oophorectomy, bilateral pelvic lymphadenectomy, periaortic lymphadenectomy, omentectomy, multiple peritoneal biopsies.  SURGEON: De Blanch, M.D ASSISTANT: Antionette Char M.D., Telford Nab RN. ANESTHESIA: Gen. with oral tracheal tube ESTIMATED BLOOD LOSS: 100 mL  SURGICAL FINDINGS: At exploratory laparotomy both ovaries were approximately 6 cm in diameter with extensive excrescences. The ovaries were not adherent to any surfaces. Remainder of the peritoneal cavity and retroperitoneal lymph nodes were normal. The appendix was surgically absent. At the completion surgical procedure no gross residual disease remained. Frozen section revealed this to be a serous tumor of at least low malignant potential. PROCEDURE: The patient was brought to the operating room, and after satisfactory attainment of general anesthesia was placed in the modified lithotomy position in [place] stirrups. The anterior abdominal wall, perineum and vagina were prepped, a Foley catheter was inserted, and the patient was draped. Surgical timeout was taken and antibiotics administered. The anterior abdominal wall was entered through midline incision extending above the umbilicus. Pelvic washings were obtained. It was found that the omentum was densely adherent to the bladder. This was freed with sharp and blunt dissection. The intestines were packed out of the pelvis after a Bookwalter retractor was positioned. The uterus was grasped with long Kelly clamps.  The right round ligament was divided and the retroperitoneal spaces opened. The ureter was identified. The ovarian vessels were skeletonized clamped cut free tied and suture-ligated. The broad ligament was incised beneath the ovarian tumor. The uterine cornu was then divided. The right tube and ovary were submitted to pathology for frozen section with the above-noted findings. Similar procedure was performed on the left side of the pelvis. The bladder flap was advanced with sharp and blunt dissection. It was noted the bladder flap had significant adhesions and scarring to it. The uterine vessels were skeletonized, clamped, cut, and suture ligated. In a stepwise fashion the paracervical and cardinal ligaments were clamped cut and suture ligated. The vaginal angles were crossclamped and the vagina transected from its junction to the cervix. The uterus cervix and left tube and ovary were submitted to pathology. The vaginal angles were transfixed with 0 Vicryl and the central portion of vagina closed with interrupted figure-of-eight sutures of 0 Vicryl.  Attention was turned to performing a pelvic lymphadenectomy. Lymph nodes overlying the external iliac artery and vein, internal iliac artery, and obturator space were resected. Care was taken to avoid injury to the genitofemoral nerve, obturator nerve, ureter and blood vessels. Similar procedure was performed on both sides the pelvis.  The retractors were re-positioned and the upper abdomen exposed. The omentum was freed from the transverse colon and then removed using the LigaSure. The portion of omentum which was adherent to the bladder was excised separately and submitted separately for pathologic evaluation.  The aorta was exposed. An incision was made over the common iliac artery (right) and extended along the aorta. The right ureter was identified and retracted laterally. Thus the vena cava was exposed. Lymph nodes overlying the right common iliac artery,  right-sided aorta and vena cava were then excised using sharp and blunt dissection. At the completion of dissection hemostasis is excellent.  Peritoneal biopsies were  then obtained from the right and left paracolic gutters, right and left pelvis, anterior cul-de-sac, and posterior cul-de-sac. The abdomen and pelvis are irrigated with saline and retractors and packs were removed. The anterior abdominal wall was closed in layers. First layer was a running mass closure using #1 PDS. Subcutaneous tissue was irrigated, hemostasis achieved with cautery and expiril was injected. Skin was closed skin staples and dressing was applied. Patient was awakened from anesthesia and taken to the recovery room in satisfactory condition. Sponge needle and isthmic counts were correct x2  De Blanch, M.D

## 2012-08-23 NOTE — Interval H&P Note (Signed)
History and Physical Interval Note:  08/23/2012 7:04 AM  Lauren Calhoun  has presented today for surgery, with the diagnosis of BILATERAL ADNEXAL MASSES  The various methods of treatment have been discussed with the patient and family. After consideration of risks, benefits and other options for treatment, the patient has consented to  Procedure(s) (LRB) with comments: EXPLORATORY LAPAROTOMY (N/A) SALPINGO OOPHORECTOMY (Bilateral) - EXPLORATORY LAPAROTOMY, BSO, POSSIBLE TAH/BSO WITH SURGICAL STAGING as a surgical intervention .  The patient's history has been reviewed, patient examined, no change in status, stable for surgery.  I have reviewed the patient's chart and labs.  Questions were answered to the patient's satisfaction.     CLARKE-PEARSON,Raynette Arras L

## 2012-08-24 LAB — CBC
HCT: 33.6 % — ABNORMAL LOW (ref 36.0–46.0)
Hemoglobin: 11.5 g/dL — ABNORMAL LOW (ref 12.0–15.0)
MCH: 29.6 pg (ref 26.0–34.0)
MCHC: 34.2 g/dL (ref 30.0–36.0)
MCV: 86.4 fL (ref 78.0–100.0)
Platelets: 175 10*3/uL (ref 150–400)
RBC: 3.89 MIL/uL (ref 3.87–5.11)
RDW: 13 % (ref 11.5–15.5)
WBC: 9.1 10*3/uL (ref 4.0–10.5)

## 2012-08-24 LAB — BASIC METABOLIC PANEL
BUN: 8 mg/dL (ref 6–23)
CO2: 24 mEq/L (ref 19–32)
Calcium: 8.6 mg/dL (ref 8.4–10.5)
Chloride: 107 mEq/L (ref 96–112)
Creatinine, Ser: 0.83 mg/dL (ref 0.50–1.10)
GFR calc Af Amer: 87 mL/min — ABNORMAL LOW (ref >90)
GFR calc non Af Amer: 75 mL/min — ABNORMAL LOW (ref >90)
Glucose, Bld: 145 mg/dL — ABNORMAL HIGH (ref 70–99)
Potassium: 4.1 mEq/L (ref 3.5–5.1)
Sodium: 138 mEq/L (ref 135–145)

## 2012-08-24 MED ORDER — ENOXAPARIN SODIUM 40 MG/0.4ML ~~LOC~~ SOLN
40.0000 mg | SUBCUTANEOUS | Status: DC
  Administered 2012-08-24 – 2012-08-25 (×2): 40 mg via SUBCUTANEOUS
  Filled 2012-08-24 (×2): qty 0.4

## 2012-08-24 MED ORDER — SIMETHICONE 80 MG PO CHEW
80.0000 mg | CHEWABLE_TABLET | Freq: Four times a day (QID) | ORAL | Status: DC | PRN
  Administered 2012-08-24: 80 mg via ORAL
  Filled 2012-08-24: qty 1

## 2012-08-24 MED ORDER — BISACODYL 10 MG RE SUPP
10.0000 mg | Freq: Every day | RECTAL | Status: DC | PRN
  Administered 2012-08-24 – 2012-08-25 (×2): 10 mg via RECTAL
  Filled 2012-08-24 (×2): qty 1

## 2012-08-24 NOTE — Care Management Note (Signed)
    Page 1 of 1   08/25/2012     4:16:18 PM   CARE MANAGEMENT NOTE 08/25/2012  Patient:  Lauren Calhoun, Lauren Calhoun   Account Number:  [id number]  Date Initiated:  08/24/2012  Documentation initiated by:  Lorenda Ishihara  Subjective/Objective Assessment:   61 yo female admitted s/p exploratory lap, TAH, BSO, lymphadenectomy, omentectomy, multiple peritoneal biopsies. PTA lived at home with spouse     Action/Plan:   Home when stable   Anticipated DC Date:  08/27/2012   Anticipated DC Plan:  HOME/SELF CARE      DC Planning Services  CM consult      Choice offered to / List presented to:             Status of service:  Completed, signed off Medicare Important Message given?   (If response is "NO", the following Medicare IM given date fields will be blank) Date Medicare IM given:   Date Additional Medicare IM given:    Discharge Disposition:  HOME/SELF CARE  Per UR Regulation:  Reviewed for med. necessity/level of care/duration of stay  If discussed at Long Length of Stay Meetings, dates discussed:    Comments:

## 2012-08-24 NOTE — Progress Notes (Signed)
1 Day Post-Op Procedure(s) (LRB): SALPINGO OOPHORECTOMY (Bilateral) OMENTECTOMY () HYSTERECTOMY ABDOMINAL (N/A) LYMPHADENECTOMY ()  Subjective: Patient reports no complaints.  Tolerating clear liquids.  Reporting adequate pain relief.  Denies nausea, vomiting, flatus, chest pain, or dyspnea.  Objective: Vital signs in last 24 hours: Temp:  [97.8 F (36.6 C)-98.7 F (37.1 C)] 98.4 F (36.9 C) (11/20 0557) Pulse Rate:  [38-55] 47  (11/20 0557) Resp:  [7-20] 7  (11/20 0919) BP: (90-123)/(58-70) 90/62 mmHg (11/20 0557) SpO2:  [93 %-100 %] 93 % (11/20 0919) FiO2 (%):  [3 %] 3 % (11/20 0557) Weight:  [140 lb 10.8 oz (63.81 kg)] 140 lb 10.8 oz (63.81 kg) (11/19 1300)    Intake/Output from previous day: 11/19 0701 - 11/20 0700 In: 4973.3 [P.O.:840; I.V.:4033.3; IV Piggyback:100] Out: 3775 [Urine:3650; Blood:125]  Physical Examination: General: alert, cooperative and no distress Resp: clear to auscultation bilaterally Cardio: S1, S2 normal, no click, no rub and bradycardic at times GI: soft, non-tender; bowel sounds normal; no masses,  no organomegaly and incision: midline incision with staples open to air, minimal amount of dried blood present. Extremities: extremities normal, atraumatic, no cyanosis or edema  Labs: WBC/Hgb/Hct/Plts:  9.1/11.5/33.6/175 (11/20 1610) BUN/Cr/glu/ALT/AST/amyl/lip:  8/0.83/--/--/--/--/-- (11/20 9604)  Assessment: 61 y.o. s/p Procedure(s): SALPINGO OOPHORECTOMY OMENTECTOMY HYSTERECTOMY ABDOMINAL LYMPHADENECTOMY: stable Pain:  Pain is well-controlled on PCA.  GI:  Tolerating po: Yes.    Prophylaxis: intermittent pneumatic compression boots.  Plan: Advance diet Encourage ambulation Saline lock IV Encourage IS use, deep breathing, and coughing Continue post operative care   LOS: 1 day    CROSS, MELISSA DEAL 08/24/2012, 9:34 AM

## 2012-08-24 NOTE — Progress Notes (Signed)
Spoke to [employer], NP about patient blood pressures low today, states to monitor but appears to be baseline. Also that patient flushed to face since started on percocet, but no further s/s, no rash or SOB, patient states she got really warm under the covers, Melissa states to continue to monitor patient for itchy throat or SOB. Informed that she is complaining of distention but remains soft, patient ambulating at this time, NP states to give patient a rocking chair and informed that incision had bloody drainage through drsg Dr. Tamela Oddi placed this am, replaced with telfa and gauze, patient ambulatory, states to call if soaks through this bandage.

## 2012-08-25 MED ORDER — ENOXAPARIN SODIUM 40 MG/0.4ML ~~LOC~~ SOLN: 40.0000 mg | SUBCUTANEOUS | Status: DC

## 2012-08-25 MED ORDER — ENOXAPARIN (LOVENOX) PATIENT EDUCATION KIT
PACK | Freq: Once | Status: DC
  Filled 2012-08-25: qty 1

## 2012-08-25 MED ORDER — OXYCODONE-ACETAMINOPHEN 5-325 MG PO TABS: 1.0000 | ORAL_TABLET | ORAL | Status: DC | PRN

## 2012-08-25 NOTE — Discharge Summary (Signed)
Physician Discharge Summary  Patient ID: ALAZE HARDIN MRN: [id number] DOB/AGE: April 06, 1951 61 y.o.  Admit date: 08/23/2012 Discharge date: 08/25/2012  Admission Diagnoses: Pelvic mass in female  Discharge Diagnoses:  Principal Problem:  *Pelvic mass in female  Discharged Condition:  The patient is in good condition and stable for discharge.  Hospital Course: On 08/23/2012, the patient underwent the following: Procedure(s): SALPINGO OOPHORECTOMY, OMENTECTOMY, HYSTERECTOMY ABDOMINAL, LYMPHADENECTOMY.  The postoperative course was uneventful.  She was discharged to home on postoperative day 2 tolerating a regular diet.  Consults: None  Significant Diagnostic Studies: None  Treatments: surgery: See above  Discharge Exam: Blood pressure 98/60, pulse 55, temperature 98.5 F (36.9 C), temperature source Oral, resp. rate 16, height 5\' 6"  (1.676 m), weight 140 lb 10.8 oz (63.81 kg), SpO2 97.00%. General appearance: alert, cooperative and no distress Resp: clear to auscultation bilaterally Cardio: regular rate and rhythm, S1, S2 normal, no murmur, click, rub or gallop GI: soft, non-tender; bowel sounds normal; no masses,  no organomegaly Extremities: extremities normal, atraumatic, no cyanosis or edema Incision/Wound:Midline with staples clean, dry, and intact  Disposition: Final discharge disposition not confirmed  Discharge Orders    Future Orders Please Complete By Expires   Diet - low sodium heart healthy      Increase activity slowly      Driving Restrictions      Comments:   No driving for 2 weeks.  Do not take narcotics and drive.   Lifting restrictions      Comments:   No lifting greater than 10 lbs.   Sexual Activity Restrictions      Comments:   No sexual activity, nothing in the vagina, for 6 weeks.   Call MD for:  temperature >100.4      Call MD for:  persistant nausea and vomiting      Call MD for:  severe uncontrolled pain      Call MD for:  redness,  tenderness, or signs of infection (pain, swelling, redness, odor or green/yellow discharge around incision site)      Call MD for:  difficulty breathing, headache or visual disturbances      Call MD for:  hives      Call MD for:  persistant dizziness or light-headedness      Call MD for:  extreme fatigue          Medication List     As of 08/25/2012  2:57 PM    TAKE these medications         cholecalciferol 1000 UNITS tablet   Commonly known as: VITAMIN D   Take 1,000 Units by mouth daily.      enoxaparin 40 MG/0.4ML injection   Commonly known as: LOVENOX   Inject 0.4 mLs (40 mg total) into the skin daily.      multivitamin with minerals Tabs   Take 1 tablet by mouth daily.      oxyCODONE-acetaminophen 5-325 MG per tablet   Commonly known as: PERCOCET/ROXICET   Take 1-2 tablets by mouth every 4 (four) hours as needed (moderate to severe pain (when tolerating fluids)).      vitamin C 500 MG tablet   Commonly known as: ASCORBIC ACID   Take 500 mg by mouth 2 (two) times daily.           Follow-up Information    Please follow up. (GYN Oncology on Wednesday at 11:00am for staple removal)          Signed: CROSS,  MELISSA DEAL 08/25/2012, 2:57 PM

## 2012-08-25 NOTE — Progress Notes (Signed)
2 Days Post-Op Procedure(s) (LRB): SALPINGO OOPHORECTOMY (Bilateral) OMENTECTOMY () HYSTERECTOMY ABDOMINAL (N/A) LYMPHADENECTOMY ()  Subjective: Patient reports intermittent gas pains in the left lower quadrant.  Tolerating reg diet.  Passing flatus and bowel movement last pm.  Denies nausea, vomiting, chest pain, or dyspnea.  Objective: Vital signs in last 24 hours: Temp:  [98.3 F (36.8 C)-98.8 F (37.1 C)] 98.5 F (36.9 C) (11/21 0557) Pulse Rate:  [48-64] 55  (11/21 0557) Resp:  [7-16] 16  (11/21 0557) BP: (90-111)/(41-71) 98/60 mmHg (11/21 0557) SpO2:  [93 %-99 %] 97 % (11/21 0557) Last BM Date: 08/24/12  Intake/Output from previous day: 11/20 0701 - 11/21 0700 In: 1200 [P.O.:1200] Out: 1650 [Urine:1650]  Physical Examination: General: alert, cooperative and no distress Resp: clear to auscultation bilaterally Cardio: regular rate and rhythm, S1, S2 normal, no murmur, click, rub or gallop and bradycardic at times GI: soft, non-tender; bowel sounds normal; no masses,  no organomegaly and incision: midline incision with staples open to air, clean, dry, and intact Extremities: extremities normal, atraumatic, no cyanosis or edema  Assessment: 61 y.o. s/p Procedure(s): SALPINGO OOPHORECTOMY OMENTECTOMY HYSTERECTOMY ABDOMINAL LYMPHADENECTOMY: stable Pain:  Pain is well-controlled on oral medications.  GI:  Tolerating po: Yes     Prophylaxis: pharmacologic prophylaxis (with any of the following: enoxaparin (Lovenox) 40mg  SQ 2 hours prior to surgery then every day) and intermittent pneumatic compression boots.  Plan: Encourage ambulation Begin lovenox home teaching Possible discharge later today Continue post-operative care   LOS: 2 days    Azalya Galyon DEAL 08/25/2012, 8:41 AM

## 2012-08-25 NOTE — Progress Notes (Signed)
Demonstrated pt how to give herself a Lovenox shot in the belly.  Pt verbalized understanding.  Also gave pt Lovenox home kit with handouts, and show her how to access the pt education network about Lovenox.  Pt verbalized understanding.

## 2012-08-26 ENCOUNTER — Encounter (HOSPITAL_COMMUNITY): Payer: Self-pay | Admitting: Gynecology

## 2012-08-26 ENCOUNTER — Telehealth: Payer: Self-pay | Admitting: *Deleted

## 2012-08-26 ENCOUNTER — Encounter (HOSPITAL_COMMUNITY): Payer: Self-pay

## 2012-08-26 NOTE — Telephone Encounter (Signed)
Patient notified of Path results.  RTC 09/06/12 @ 1400.

## 2012-08-31 ENCOUNTER — Encounter: Payer: Self-pay | Admitting: *Deleted

## 2012-08-31 NOTE — Progress Notes (Signed)
Pt in for staple removal.  Incision CD&I.  Staples removed and steri-strips applied.  Reportable S&S reviewed.  RTC 09/06/12 for treatment planning.

## 2012-09-05 ENCOUNTER — Other Ambulatory Visit: Payer: Self-pay | Admitting: Oncology

## 2012-09-06 ENCOUNTER — Telehealth: Payer: Self-pay | Admitting: Gynecologic Oncology

## 2012-09-06 ENCOUNTER — Encounter: Payer: Self-pay | Admitting: Gynecology

## 2012-09-06 ENCOUNTER — Ambulatory Visit: Payer: BC Managed Care – PPO | Attending: Gynecology | Admitting: Gynecology

## 2012-09-06 VITALS — BP 110/76 | HR 70 | Temp 98.4°F | Resp 20 | Ht 65.91 in | Wt 144.6 lb

## 2012-09-06 DIAGNOSIS — Z853 Personal history of malignant neoplasm of breast: Secondary | ICD-10-CM | POA: Insufficient documentation

## 2012-09-06 DIAGNOSIS — N189 Chronic kidney disease, unspecified: Secondary | ICD-10-CM | POA: Insufficient documentation

## 2012-09-06 DIAGNOSIS — Z780 Asymptomatic menopausal state: Secondary | ICD-10-CM | POA: Insufficient documentation

## 2012-09-06 DIAGNOSIS — Z803 Family history of malignant neoplasm of breast: Secondary | ICD-10-CM | POA: Insufficient documentation

## 2012-09-06 DIAGNOSIS — D4959 Neoplasm of unspecified behavior of other genitourinary organ: Secondary | ICD-10-CM | POA: Insufficient documentation

## 2012-09-06 DIAGNOSIS — D3910 Neoplasm of uncertain behavior of unspecified ovary: Secondary | ICD-10-CM | POA: Insufficient documentation

## 2012-09-06 DIAGNOSIS — Z8049 Family history of malignant neoplasm of other genital organs: Secondary | ICD-10-CM | POA: Insufficient documentation

## 2012-09-06 NOTE — Patient Instructions (Signed)
Gradually increase her activity. Please finish the prescribed Lovenox. Return on January 15 for another postoperative visit.

## 2012-09-06 NOTE — Telephone Encounter (Signed)
Called to speak with patient about post-operative status and to follow up on complaints of intermittent nausea.  Message left asking the patient to please call the office with an update on post-operative status.

## 2012-09-06 NOTE — Progress Notes (Signed)
Consult Note: Gyn-Onc   Lauren Calhoun 61 y.o. female  Chief Complaint  Patient presents with  . Borderline ovarian tumor    Follow up post-op    Interval History: Patient returns today for continuing followup and initial postoperative evaluation. She reports she is doing well increase in activity. She has normal GI and GU function. Abdominal pain is well-controlled with 600 mg of ibuprofen.  HPI:61 year old white married female seen in consultation request of Dr. Leda Quail regarding newly diagnosed bilateral ovarian neoplasms and an elevated CA 125. On routine laboratory testing a CA 125 was obtained and returned as 229 units per mL. Subsequent CT scan was obtained showing bilateral ovarian masses. The right measured 5.5 x 5.5 x 6.3 cm in the left measured 6.3 x 5.1 x 5.1 cm. There is no evidence of free fluid and only minimal prominence of a lymph node in the right lower quadrantThe patient has a remote history of breast cancer.  Family history may be significant in that her mother died at age 60 of uterine or cervix cancer, a sister at age 67 of breast cancer, and a maternal aunt may have had ovarian cancer.  She is menopausal and not taking any hormone replacement therapy.  She underwent exploratory laparotomy on 08/23/2012. Both ovaries were enlarged with surface excrescences. On frozen section there considered a borderline tumors. We proceeded with full surgical staging including pelvic and aortic lymphadenectomy, omentectomy, and peritoneal biopsies. Final pathology revealed a low malignant potential tumor with focal areas of microinvasion. All biopsies be on the ovaries were negative for metastatic disease. The patient had an uncomplicated postoperative course.     Review of Systems:10 point review of systems is negative as noted above.   Vitals: Blood pressure 110/76, pulse 70, temperature 98.4 F (36.9 C), temperature source Oral, resp. rate 20, height 5' 5.91" (1.674 m),  weight 144 lb 9.6 oz (65.59 kg).  Physical Exam: General : The patient is a healthy woman in no acute distress.  HEENT: normocephalic, extraoccular movements normal; neck is supple without thyromegally  The abdomen is soft and nontender. Midline incision is healing well. Lower extremities: No edema or varicosities. Normal range of motion    Assessment/Plan: Stage I C. ovarian borderline tumor with focal areas of microinvasion. Her with no evidence of benefit to chemotherapy in this circumstance, I recommend the patient and her age surveillance program with office visits every 3 months and CA 125 value obtained at that time. The patient will return to see me for a six-week postoperative followup. In the interim she will gradually increase her activity and she will continue to use ibuprofen when necessary.  No Known Allergies  Past Medical History  Diagnosis Date  . Chronic kidney disease     kidney stone now ct scan  . Cancer     Breast,sential node  . Tear of meniscus of knee joint     presently has a tear left knee    Past Surgical History  Procedure Date  . Breast lumpectomy 2005    RIGHT  . Appendectomy AGE 14  . Salpingoophorectomy 08/23/2012    Procedure: SALPINGO OOPHORECTOMY;  Surgeon: Jeannette Corpus, MD;  Location: [facility];  Service: Gynecology;  Laterality: Bilateral;  . Omentectomy 08/23/2012    Procedure: OMENTECTOMY;  Surgeon: Jeannette Corpus, MD;  Location: [facility];  Service: Gynecology;;  . Abdominal hysterectomy 08/23/2012    Procedure: HYSTERECTOMY ABDOMINAL;  Surgeon: Jeannette Corpus, MD;  Location: [facility];  Service: Gynecology;  Laterality: N/A;  Perineal biopsies  . Lymphadenectomy 08/23/2012    Procedure: LYMPHADENECTOMY;  Surgeon: Jeannette Corpus, MD;  Location: [facility];  Service: Gynecology;;  Pelvic and para-aortic    Current Outpatient Prescriptions  Medication Sig Dispense Refill  . cholecalciferol (VITAMIN D) 1000 UNITS  tablet Take 1,000 Units by mouth daily.      Marland Kitchen enoxaparin (LOVENOX) 40 MG/0.4ML injection Inject 0.4 mLs (40 mg total) into the skin daily.  21 Syringe  0  . ibuprofen (ADVIL,MOTRIN) 200 MG tablet Take 200 mg by mouth every 6 (six) hours as needed.      . Multiple Vitamin (MULTIVITAMIN WITH MINERALS) TABS Take 1 tablet by mouth daily.      . vitamin C (ASCORBIC ACID) 500 MG tablet Take 500 mg by mouth 2 (two) times daily.      Marland Kitchen oxyCODONE-acetaminophen (PERCOCET/ROXICET) 5-325 MG per tablet Take 1-2 tablets by mouth every 4 (four) hours as needed (moderate to severe pain (when tolerating fluids)).  60 tablet  0    History   Social History  . Marital Status: Married    Spouse Name: N/A    Number of Children: N/A  . Years of Education: N/A   Occupational History  . Not on file.   Social History Main Topics  . Smoking status: Never Smoker   . Smokeless tobacco: Never Used  . Alcohol Use: Yes     Comment: RARE MAYBE 2X MONTH  . Drug Use: No  . Sexually Active: Yes   Other Topics Concern  . Not on file   Social History Narrative  . No narrative on file    Family History  Problem Relation Age of Onset  . Cancer Mother     UTERINE AGE 60  . Cancer Sister     BREAST AGE 59  . Cancer Maternal Aunt     Ovarian cancer      CLARKE-PEARSON,Clayvon Parlett L, MD 09/06/2012, 3:02 PM

## 2012-09-08 ENCOUNTER — Other Ambulatory Visit: Payer: Self-pay | Admitting: Oncology

## 2012-09-12 ENCOUNTER — Encounter: Payer: Self-pay | Admitting: Oncology

## 2012-10-10 ENCOUNTER — Other Ambulatory Visit: Payer: Self-pay | Admitting: *Deleted

## 2012-10-10 DIAGNOSIS — D3910 Neoplasm of uncertain behavior of unspecified ovary: Secondary | ICD-10-CM

## 2012-10-13 ENCOUNTER — Other Ambulatory Visit: Payer: BC Managed Care – PPO | Admitting: Lab

## 2012-10-13 DIAGNOSIS — D3910 Neoplasm of uncertain behavior of unspecified ovary: Secondary | ICD-10-CM

## 2012-10-13 LAB — CA 125: CA 125: 21.8 U/mL (ref 0.0–30.2)

## 2012-10-19 ENCOUNTER — Ambulatory Visit: Payer: BC Managed Care – PPO | Attending: Gynecology | Admitting: Gynecology

## 2012-10-19 ENCOUNTER — Encounter: Payer: Self-pay | Admitting: Gynecology

## 2012-10-19 VITALS — BP 118/70 | HR 68 | Temp 97.8°F | Resp 18 | Ht 65.91 in | Wt 140.9 lb

## 2012-10-19 DIAGNOSIS — D3910 Neoplasm of uncertain behavior of unspecified ovary: Secondary | ICD-10-CM

## 2012-10-19 DIAGNOSIS — D279 Benign neoplasm of unspecified ovary: Secondary | ICD-10-CM | POA: Insufficient documentation

## 2012-10-19 DIAGNOSIS — Z853 Personal history of malignant neoplasm of breast: Secondary | ICD-10-CM | POA: Insufficient documentation

## 2012-10-19 DIAGNOSIS — Z803 Family history of malignant neoplasm of breast: Secondary | ICD-10-CM | POA: Insufficient documentation

## 2012-10-19 DIAGNOSIS — Z79899 Other long term (current) drug therapy: Secondary | ICD-10-CM | POA: Insufficient documentation

## 2012-10-19 NOTE — Patient Instructions (Signed)
Return to see [place] in 3 months. You may return to full levels of activity. Gradually increase your exercise and weight lifting.

## 2012-10-19 NOTE — Progress Notes (Signed)
Consult Note: Gyn-Onc   Lauren Calhoun 62 y.o. female  Chief Complaint  Patient presents with  . Ovarian tumor of Borderline malignancy    Follow up    Interval History: The patient returns today for continuing followup. Since her last visit she's done well and are functional status is steadily improving. In late December she did sneeze and developed acute left mid abdominal pain. This gradually resolved. Subsequently she's had occasional intermittent right mid abdominal pain. This is not associated with any particular activity motion or her diet. However the patient does note that she's been increasing her activity level and doing much more bending and stretching that in the past. She has no GI or GU symptoms. She denies any vaginal bleeding or discharge. A recent CA 125 was 21.8 units per mL.  HPI:62 year old white married female seen in consultation request of Dr. Leda Quail regarding newly diagnosed bilateral ovarian neoplasms and an elevated CA 125. On routine laboratory testing a CA 125 was obtained and returned as 229 units per mL. Subsequent CT scan was obtained showing bilateral ovarian masses. The right measured 5.5 x 5.5 x 6.3 cm in the left measured 6.3 x 5.1 x 5.1 cm. There is no evidence of free fluid and only minimal prominence of a lymph node in the right lower quadrantThe patient has a remote history of breast cancer.  Family history may be significant in that her mother died at age 23 of uterine or cervix cancer, a sister at age 33 of breast cancer, and a maternal aunt may have had ovarian cancer. She is menopausal and not taking any hormone replacement therapy.  She underwent exploratory laparotomy on 08/23/2012. Both ovaries were enlarged with surface excrescences. On frozen section the tumors were considered to be borderline tumors. We proceeded with full surgical staging including pelvic and aortic lymphadenectomy, omentectomy, and peritoneal biopsies. Final pathology  revealed a low malignant potential tumor with focal areas of microinvasion. All biopsies be on the ovaries were negative for metastatic disease. The patient had an uncomplicated postoperative course.      Review of Systems:10 point review of systems is negative as noted above.   Vitals: Blood pressure 118/70, pulse 68, temperature 97.8 F (36.6 C), temperature source Oral, resp. rate 18, height 5' 5.91" (1.674 m), weight 140 lb 14.4 oz (63.912 kg).  Physical Exam: General : The patient is a healthy woman in no acute distress.  HEENT: normocephalic, extraoccular movements normal; neck is supple without thyromegally  Lynphnodes: Supraclavicular and inguinal nodes not enlarged  Abdomen: Soft, non-tender, no ascites, no organomegally, no masses, no hernias midline incision is well-healed. I am unable to able to elicit any pain on deep palpation. Pelvic:  EGBUS: Normal female  Vagina: Normal, no lesions the cuff is well healed Urethra and Bladder: Normal, non-tender  Cervix: Surgically absent  Uterus: Surgically absent  Bi-manual examination: Non-tender; no adenxal masses or nodularity  Rectal: normal sphincter tone, no masses, no blood  Lower extremities: No edema or varicosities. 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 Patient's clinically free of disease.  She return to see me in 3 months for followup and we will obtain a CA 125 again at that time. She's given the okay to return to full levels of activity including sexual intercourse.  She will contact us if her pain persists.  No Known Allergies  Past Medical History  Diagnosis Date  .  Chronic kidney disease     kidney stone now ct scan  . Cancer     Breast,sential node  . Tear of meniscus of knee joint     presently has a tear left knee    Past Surgical History  Procedure Date  . Breast lumpectomy 2005    RIGHT  . Appendectomy AGE 61    . Salpingoophorectomy 08/23/2012    Procedure: SALPINGO OOPHORECTOMY;  Surgeon: Jeannette Corpus, MD;  Location: [facility];  Service: Gynecology;  Laterality: Bilateral;  . Omentectomy 08/23/2012    Procedure: OMENTECTOMY;  Surgeon: Jeannette Corpus, MD;  Location: [facility];  Service: Gynecology;;  . Abdominal hysterectomy 08/23/2012    Procedure: HYSTERECTOMY ABDOMINAL;  Surgeon: Jeannette Corpus, MD;  Location: [facility];  Service: Gynecology;  Laterality: N/A;  Perineal biopsies  . Lymphadenectomy 08/23/2012    Procedure: LYMPHADENECTOMY;  Surgeon: Jeannette Corpus, MD;  Location: [facility];  Service: Gynecology;;  Pelvic and para-aortic    Current Outpatient Prescriptions  Medication Sig Dispense Refill  . cholecalciferol (VITAMIN D) 1000 UNITS tablet Take 1,000 Units by mouth daily.      . Multiple Vitamin (MULTIVITAMIN WITH MINERALS) TABS Take 1 tablet by mouth daily.      . vitamin C (ASCORBIC ACID) 500 MG tablet Take 500 mg by mouth 2 (two) times daily.      Marland Kitchen enoxaparin (LOVENOX) 40 MG/0.4ML injection Inject 0.4 mLs (40 mg total) into the skin daily.  21 Syringe  0  . ibuprofen (ADVIL,MOTRIN) 200 MG tablet Take 200 mg by mouth every 6 (six) hours as needed.      Marland Kitchen oxyCODONE-acetaminophen (PERCOCET/ROXICET) 5-325 MG per tablet Take 1-2 tablets by mouth every 4 (four) hours as needed (moderate to severe pain (when tolerating fluids)).  60 tablet  0    History   Social History  . Marital Status: Married    Spouse Name: N/A    Number of Children: N/A  . Years of Education: N/A   Occupational History  . Not on file.   Social History Main Topics  . Smoking status: Never Smoker   . Smokeless tobacco: Never Used  . Alcohol Use: Yes     Comment: RARE MAYBE 2X MONTH  . Drug Use: No  . Sexually Active: Yes   Other Topics Concern  . Not on file   Social History Narrative  . No narrative on file    Family History  Problem Relation Age of Onset  . Cancer  Mother     UTERINE AGE 602  . Cancer Sister     BREAST AGE 28  . Cancer Maternal Aunt     Ovarian cancer      CLARKE-PEARSON,Shenee Wignall L, MD 10/19/2012, 10:30 AM

## 2012-12-22 ENCOUNTER — Telehealth: Payer: Self-pay | Admitting: Oncology

## 2012-12-22 NOTE — Telephone Encounter (Signed)
Pt called today to move 4/18 lb to 4/18. This is a lb attached to her 4/21 gynonc appt. Per pt she s/w Harriett Sine in gynonc and per Harriett Sine it is ok tio moved to 4/18 lb to 4/17. Pt has new d/t.

## 2013-01-19 ENCOUNTER — Other Ambulatory Visit (HOSPITAL_BASED_OUTPATIENT_CLINIC_OR_DEPARTMENT_OTHER): Payer: BC Managed Care – PPO | Admitting: Lab

## 2013-01-19 DIAGNOSIS — D3910 Neoplasm of uncertain behavior of unspecified ovary: Secondary | ICD-10-CM

## 2013-01-19 LAB — CA 125: CA 125: 10.4 U/mL (ref 0.0–30.2)

## 2013-01-20 ENCOUNTER — Other Ambulatory Visit: Payer: BC Managed Care – PPO | Admitting: Lab

## 2013-01-23 ENCOUNTER — Encounter: Payer: Self-pay | Admitting: Gynecology

## 2013-01-23 ENCOUNTER — Ambulatory Visit: Payer: BC Managed Care – PPO | Attending: Gynecology | Admitting: Gynecology

## 2013-01-23 VITALS — BP 102/82 | HR 92 | Temp 98.6°F | Resp 20 | Ht 65.9 in | Wt 143.0 lb

## 2013-01-23 DIAGNOSIS — Z780 Asymptomatic menopausal state: Secondary | ICD-10-CM | POA: Insufficient documentation

## 2013-01-23 DIAGNOSIS — D3910 Neoplasm of uncertain behavior of unspecified ovary: Secondary | ICD-10-CM

## 2013-01-23 DIAGNOSIS — R971 Elevated cancer antigen 125 [CA 125]: Secondary | ICD-10-CM | POA: Insufficient documentation

## 2013-01-23 DIAGNOSIS — R32 Unspecified urinary incontinence: Secondary | ICD-10-CM | POA: Insufficient documentation

## 2013-01-23 DIAGNOSIS — Z853 Personal history of malignant neoplasm of breast: Secondary | ICD-10-CM | POA: Insufficient documentation

## 2013-01-23 DIAGNOSIS — D4959 Neoplasm of unspecified behavior of other genitourinary organ: Secondary | ICD-10-CM | POA: Insufficient documentation

## 2013-01-23 NOTE — Patient Instructions (Signed)
Continue Kegel exercises and limiting caffeine.  We will have another appointment in 3 months. CA 125 will be obtained prior to that visit.

## 2013-01-23 NOTE — Progress Notes (Signed)
Consult Note: Gyn-Onc   Lauren Calhoun 62 y.o. female  Chief Complaint  Patient presents with  . LMP Tumor    Follow up    Assessment : Stage IC low malignant potential tumor of the ovaries. Patient's clinically free of disease.  Plan: She'll return in 3 months for followup and had a CA 125 just prior to that visit. To contact us if her incontinence worsens.  Interval History:  The patient returns today for her regular scheduled visits. Since her last visit she reports that she's return to full levels of activity including walking 6-7 miles a day. She feels quite well. Her recent CA 125 is 10 units per mL.  Per chief complaint is of urinary incontinence. On closer questioning it sounds as though this is a mixed stress and urge incontinence. The patient has been doing Kegel exercises and limiting caffeine and has noticed some improvement.  She denies any other GI or GU symptoms and denies any pelvic pain or pressure vaginal bleeding or discharge.  HPI:62 year old white married female seen in consultation request of Dr. Leda Quail regarding bilateral ovarian neoplasms and an elevated CA 125. On routine laboratory testing a CA 125 was obtained and returned as 229 units per mL. Subsequent CT scan was obtained showing bilateral ovarian masses. The right measured 5.5 x 5.5 x 6.3 cm in the left measured 6.3 x 5.1 x 5.1 cm. There is no evidence of free fluid and only minimal prominence of a lymph node in the right lower quadrantThe patient has a remote history of breast cancer.  Family history may be significant in that her mother died at age 18 of uterine or cervix cancer, a sister at age 61 of breast cancer, and a maternal aunt may have had ovarian cancer. She is menopausal and not taking any hormone replacement therapy.   She underwent exploratory laparotomy on 08/23/2012. Both ovaries were enlarged with surface excrescences. On frozen section the tumors were considered to be borderline  tumors. We proceeded with full surgical staging including pelvic and aortic lymphadenectomy, omentectomy, and peritoneal biopsies. Final pathology revealed a low malignant potential tumor with focal areas of microinvasion. All biopsies be on the ovaries were negative for metastatic disease. The patient had an uncomplicated postoperative course. Given the final pathology no adjuvant therapy was recommended.   Review of Systems:10 point review of systems is negative except as noted in interval history.   Vitals: Blood pressure 102/82, pulse 92, temperature 98.6 F (37 C), resp. rate 20, height 5' 5.9" (1.674 m), weight 143 lb (64.864 kg).  Physical Exam: General : The patient is a healthy woman in no acute distress.  HEENT: normocephalic, extraoccular movements normal; neck is supple without thyromegally  Lynphnodes: Supraclavicular and inguinal nodes not enlarged  Abdomen: Soft, non-tender, no ascites, no organomegally, no masses, no hernias  Pelvic:  EGBUS: Normal female  Vagina: Normal, no lesions  Urethra and Bladder: Normal, non-tender  Cervix: Surgically absent  Uterus: Surgically absent  Bi-manual examination: Non-tender; no adenxal masses or nodularity  Rectal: normal sphincter tone, no masses, no blood  Lower extremities: No edema or varicosities. Normal range of motion      No Known Allergies  Past Medical History  Diagnosis Date  . Chronic kidney disease     kidney stone now ct scan  . Cancer     Breast,sential node  . Tear of meniscus of knee joint     presently has a tear left knee  Past Surgical History  Procedure Laterality Date  . Breast lumpectomy  2005    RIGHT  . Appendectomy  AGE 74  . Salpingoophorectomy  08/23/2012    Procedure: SALPINGO OOPHORECTOMY;  Surgeon: Jeannette Corpus, MD;  Location: [facility];  Service: Gynecology;  Laterality: Bilateral;  . Omentectomy  08/23/2012    Procedure: OMENTECTOMY;  Surgeon: Jeannette Corpus, MD;   Location: [facility];  Service: Gynecology;;  . Abdominal hysterectomy  08/23/2012    Procedure: HYSTERECTOMY ABDOMINAL;  Surgeon: Jeannette Corpus, MD;  Location: [facility];  Service: Gynecology;  Laterality: N/A;  Perineal biopsies  . Lymphadenectomy  08/23/2012    Procedure: LYMPHADENECTOMY;  Surgeon: Jeannette Corpus, MD;  Location: [facility];  Service: Gynecology;;  Pelvic and para-aortic    Current Outpatient Prescriptions  Medication Sig Dispense Refill  . cholecalciferol (VITAMIN D) 1000 UNITS tablet Take 1,000 Units by mouth daily.      Marland Kitchen enoxaparin (LOVENOX) 40 MG/0.4ML injection Inject 0.4 mLs (40 mg total) into the skin daily.  21 Syringe  0  . ibuprofen (ADVIL,MOTRIN) 200 MG tablet Take 200 mg by mouth every 6 (six) hours as needed.      . Multiple Vitamin (MULTIVITAMIN WITH MINERALS) TABS Take 1 tablet by mouth daily.      Marland Kitchen oxyCODONE-acetaminophen (PERCOCET/ROXICET) 5-325 MG per tablet Take 1-2 tablets by mouth every 4 (four) hours as needed (moderate to severe pain (when tolerating fluids)).  60 tablet  0  . vitamin C (ASCORBIC ACID) 500 MG tablet Take 500 mg by mouth 2 (two) times daily.       No current facility-administered medications for this visit.    History   Social History  . Marital Status: Married    Spouse Name: N/A    Number of Children: N/A  . Years of Education: N/A   Occupational History  . Not on file.   Social History Main Topics  . Smoking status: Never Smoker   . Smokeless tobacco: Never Used  . Alcohol Use: Yes     Comment: RARE MAYBE 2X MONTH  . Drug Use: No  . Sexually Active: Yes   Other Topics Concern  . Not on file   Social History Narrative  . No narrative on file    Family History  Problem Relation Age of Onset  . Cancer Mother     UTERINE AGE 732  . Cancer Sister     BREAST AGE 26  . Cancer Maternal Aunt     Ovarian cancer      CLARKE-PEARSON,Stanislav Gervase L, MD 01/23/2013, 1:49 PM

## 2013-04-04 ENCOUNTER — Other Ambulatory Visit: Payer: BC Managed Care – PPO | Admitting: Lab

## 2013-04-04 ENCOUNTER — Other Ambulatory Visit: Payer: Self-pay | Admitting: Gynecologic Oncology

## 2013-04-04 DIAGNOSIS — D3910 Neoplasm of uncertain behavior of unspecified ovary: Secondary | ICD-10-CM

## 2013-04-05 ENCOUNTER — Telehealth: Payer: Self-pay | Admitting: Gynecologic Oncology

## 2013-04-05 LAB — CA 125: CA 125: 9 U/mL (ref 0.0–30.2)

## 2013-04-05 NOTE — Telephone Encounter (Signed)
Message left with CA 125 results.  Instructed to call for any needs or concerns.

## 2013-04-10 ENCOUNTER — Ambulatory Visit: Payer: BC Managed Care – PPO | Attending: Gynecology | Admitting: Gynecology

## 2013-04-10 ENCOUNTER — Encounter: Payer: Self-pay | Admitting: Gynecology

## 2013-04-10 VITALS — BP 118/72 | HR 64 | Temp 98.1°F | Resp 16 | Ht 65.95 in | Wt 140.7 lb

## 2013-04-10 DIAGNOSIS — Z90710 Acquired absence of both cervix and uterus: Secondary | ICD-10-CM | POA: Insufficient documentation

## 2013-04-10 DIAGNOSIS — D3910 Neoplasm of uncertain behavior of unspecified ovary: Secondary | ICD-10-CM | POA: Insufficient documentation

## 2013-04-10 DIAGNOSIS — R32 Unspecified urinary incontinence: Secondary | ICD-10-CM | POA: Insufficient documentation

## 2013-04-10 DIAGNOSIS — Z853 Personal history of malignant neoplasm of breast: Secondary | ICD-10-CM | POA: Insufficient documentation

## 2013-04-10 NOTE — Patient Instructions (Signed)
We will see you again in 3 months. We will obtain a CA 125 just prior to that visit.

## 2013-04-10 NOTE — Progress Notes (Signed)
Consult Note: Gyn-Onc   Lauren Calhoun 62 y.o. female  Chief Complaint  Patient presents with  . 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 Patient's clinically free of disease.  Plan: She'll return in 3 months for followup and   Have a CA 125 just prior to that visit.  At that juncture we will length and are visits to six-month intervals..  Interval History:  The patient returns today for her regular scheduled visits. Since her last visit she reports that she's return to full levels of activity including walking 6-7 miles a day. She feels quite well. Her recent CA 125 is 9 units per mL.  Per chief complaint is of urinary incontinence  Which is better now the patient is performing Kegel exercises..   She denies any other GI or GU symptoms and denies any pelvic pain or pressure vaginal bleeding or discharge.Her functional status is excellent.  HPI:62 year old white married female seen in consultation request of Dr. Leda Quail regarding bilateral ovarian neoplasms and an elevated CA 125. On routine laboratory testing a CA 125 was obtained and returned as 229 units per mL. Subsequent CT scan was obtained showing bilateral ovarian masses. The right measured 5.5 x 5.5 x 6.3 cm in the left measured 6.3 x 5.1 x 5.1 cm. There is no evidence of free fluid and only minimal prominence of a lymph node in the right lower quadrantThe patient has a remote history of breast cancer.  Family history may be significant in that her mother died at age 61 of uterine or cervix cancer, a sister at age 50 of breast cancer, and a maternal aunt may have had ovarian cancer. She is menopausal and not taking any hormone replacement therapy.   She underwent exploratory laparotomy on 08/23/2012. Both ovaries were enlarged with surface excrescences. On frozen section the tumors were considered to be borderline tumors. We proceeded with full surgical staging  including pelvic and aortic lymphadenectomy, omentectomy, and peritoneal biopsies. Final pathology revealed a low malignant potential tumor with focal areas of microinvasion. All biopsies be on the ovaries were negative for metastatic disease. The patient had an uncomplicated postoperative course. Given the final pathology no adjuvant therapy was recommended.   Review of Systems:10 point review of systems is negative except as noted in interval history.   Vitals: Blood pressure 118/72, pulse 64, temperature 98.1 F (36.7 C), temperature source Oral, resp. rate 16, height 5' 5.95" (1.675 m), weight 140 lb 11.2 oz (63.821 kg).  Physical Exam: General : The patient is a healthy woman in no acute distress.  HEENT: normocephalic, extraoccular movements normal; neck is supple without thyromegally  Lynphnodes: Supraclavicular and inguinal nodes not enlarged  Abdomen: Soft, non-tender, no ascites, no organomegally, no masses, no hernias  Pelvic:  EGBUS: Normal female  Vagina: Normal, no lesions  Urethra and Bladder: Normal, non-tender  Cervix: Surgically absent  Uterus: Surgically absent  Bi-manual examination: Non-tender; no adenxal masses or nodularity  Rectal: normal sphincter tone, no masses, no blood  Lower extremities: No edema or varicosities. Normal range of motion      No Known Allergies  Past Medical History  Diagnosis Date  . Chronic kidney disease     kidney stone now ct scan  . Cancer     Breast,sential node  . Tear of meniscus of knee joint     presently has a tear left knee    Past  Surgical History  Procedure Laterality Date  . Breast lumpectomy  2005    RIGHT  . Appendectomy  AGE 16  . Salpingoophorectomy  08/23/2012    Procedure: SALPINGO OOPHORECTOMY;  Surgeon: Jeannette Corpus, MD;  Location: [facility];  Service: Gynecology;  Laterality: Bilateral;  . Omentectomy  08/23/2012    Procedure: OMENTECTOMY;  Surgeon: Jeannette Corpus, MD;  Location: [facility];  Service: Gynecology;;  . Abdominal hysterectomy  08/23/2012    Procedure: HYSTERECTOMY ABDOMINAL;  Surgeon: Jeannette Corpus, MD;  Location: [facility];  Service: Gynecology;  Laterality: N/A;  Perineal biopsies  . Lymphadenectomy  08/23/2012    Procedure: LYMPHADENECTOMY;  Surgeon: Jeannette Corpus, MD;  Location: [facility];  Service: Gynecology;;  Pelvic and para-aortic    Current Outpatient Prescriptions  Medication Sig Dispense Refill  . cholecalciferol (VITAMIN D) 1000 UNITS tablet Take 1,000 Units by mouth daily.      Marland Kitchen enoxaparin (LOVENOX) 40 MG/0.4ML injection Inject 0.4 mLs (40 mg total) into the skin daily.  21 Syringe  0  . ibuprofen (ADVIL,MOTRIN) 200 MG tablet Take 200 mg by mouth every 6 (six) hours as needed.      . Multiple Vitamin (MULTIVITAMIN WITH MINERALS) TABS Take 1 tablet by mouth daily.      Marland Kitchen oxyCODONE-acetaminophen (PERCOCET/ROXICET) 5-325 MG per tablet Take 1-2 tablets by mouth every 4 (four) hours as needed (moderate to severe pain (when tolerating fluids)).  60 tablet  0  . vitamin C (ASCORBIC ACID) 500 MG tablet Take 500 mg by mouth 2 (two) times daily.       No current facility-administered medications for this visit.    History   Social History  . Marital Status: Married    Spouse Name: N/A    Number of Children: N/A  . Years of Education: N/A   Occupational History  . Not on file.   Social History Main Topics  . Smoking status: Never Smoker   . Smokeless tobacco: Never Used  . Alcohol Use: Yes     Comment: RARE MAYBE 2X MONTH  . Drug Use: No  . Sexually Active: Yes   Other Topics Concern  . Not on file   Social History Narrative  . No narrative on file    Family History  Problem Relation Age of Onset  . Cancer Mother     UTERINE AGE 152  . Cancer Sister     BREAST AGE 80  . Cancer Maternal Aunt     Ovarian cancer      CLARKE-PEARSON,Keasia Dubose L, MD 04/10/2013, 1:03 PM

## 2013-06-06 ENCOUNTER — Telehealth: Payer: Self-pay | Admitting: Obstetrics & Gynecology

## 2013-06-06 NOTE — Telephone Encounter (Signed)
Patient is calling in regards to a medicine that she was prescribed . Clobetasol Propionate . Wants to talk usage . Wants diagnoses and why it prescribed.

## 2013-06-07 NOTE — Telephone Encounter (Signed)
Yes.  Thank you.

## 2013-06-07 NOTE — Telephone Encounter (Signed)
Spoke with patient she had questions about the Clobetasol 0.05% ointment and what it was prescribed For and the first time we diagnosed this problem. I told her it was prescribed for treatment of  Lichen Sclerosus of The perineum. And we diagnosed this 11/2009. Then she asked if it could be sexually transmitted? I explained to her  Lichen Sclerosus is not contagious,  it can not be caught from another person. Patient was ok with this she had No other questions. Was this ok?

## 2013-07-19 ENCOUNTER — Other Ambulatory Visit: Payer: Self-pay | Admitting: Gynecologic Oncology

## 2013-07-19 DIAGNOSIS — D3910 Neoplasm of uncertain behavior of unspecified ovary: Secondary | ICD-10-CM

## 2013-07-21 ENCOUNTER — Ambulatory Visit (HOSPITAL_BASED_OUTPATIENT_CLINIC_OR_DEPARTMENT_OTHER): Payer: BC Managed Care – PPO | Admitting: Lab

## 2013-07-21 DIAGNOSIS — D3910 Neoplasm of uncertain behavior of unspecified ovary: Secondary | ICD-10-CM

## 2013-07-21 LAB — CA 125: CA 125: 11.2 U/mL (ref 0.0–30.2)

## 2013-07-24 ENCOUNTER — Telehealth: Payer: Self-pay | Admitting: Gynecologic Oncology

## 2013-07-24 NOTE — Telephone Encounter (Signed)
Patient notified of CA 125 results.  No concerns voiced. 

## 2013-07-28 ENCOUNTER — Ambulatory Visit: Payer: BC Managed Care – PPO | Admitting: Gynecology

## 2013-08-03 ENCOUNTER — Other Ambulatory Visit: Payer: BC Managed Care – PPO | Admitting: Lab

## 2013-08-04 ENCOUNTER — Encounter: Payer: Self-pay | Admitting: Gynecology

## 2013-08-04 ENCOUNTER — Ambulatory Visit: Payer: BC Managed Care – PPO | Attending: Gynecology | Admitting: Gynecology

## 2013-08-04 VITALS — BP 114/64 | HR 63 | Temp 98.0°F | Resp 16 | Ht 65.95 in | Wt 141.6 lb

## 2013-08-04 DIAGNOSIS — Z853 Personal history of malignant neoplasm of breast: Secondary | ICD-10-CM | POA: Insufficient documentation

## 2013-08-04 DIAGNOSIS — Z09 Encounter for follow-up examination after completed treatment for conditions other than malignant neoplasm: Secondary | ICD-10-CM | POA: Insufficient documentation

## 2013-08-04 DIAGNOSIS — D3910 Neoplasm of uncertain behavior of unspecified ovary: Secondary | ICD-10-CM

## 2013-08-04 DIAGNOSIS — Z90710 Acquired absence of both cervix and uterus: Secondary | ICD-10-CM | POA: Insufficient documentation

## 2013-08-04 DIAGNOSIS — Z8742 Personal history of other diseases of the female genital tract: Secondary | ICD-10-CM | POA: Insufficient documentation

## 2013-08-04 DIAGNOSIS — Z9079 Acquired absence of other genital organ(s): Secondary | ICD-10-CM | POA: Insufficient documentation

## 2013-08-04 DIAGNOSIS — R3915 Urgency of urination: Secondary | ICD-10-CM | POA: Insufficient documentation

## 2013-08-04 NOTE — Patient Instructions (Signed)
We will contact you with your appointment date and time with the urologist.  Plan to follow up in six months or sooner if needed.

## 2013-08-04 NOTE — Progress Notes (Signed)
Consult Note: Gyn-Onc   Lauren Calhoun 62 y.o. female  Chief Complaint  Patient presents with  . 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 Patient's clinically free of disease.  Urinary urgency consistent with an unstable bladder.  Plan: She'll return in 6 months for followup and   Have a CA 125 just prior to that visit.   We will refer the patient to Dr. Lorin Picket McDermid for evaluation of her bladder dysfunction.  Interval History:  The patient returns today for her regular scheduled visits. Since her last visit she reports that she's return to full levels of activity including walking 5 miles a day. She feels quite well. Her recent CA 125 is 11 units per mL.  Per chief complaint is of urinary incontinence and urgency. It is better when she performs Kegel exercises..   She denies any other GI or GU symptoms and denies any pelvic pain or pressure vaginal bleeding or discharge.Her functional status is excellent.  HPI:62 year old white married female seen in consultation request of Dr. Leda Quail regarding bilateral ovarian neoplasms and an elevated CA 125. On routine laboratory testing a CA 125 was obtained and returned as 229 units per mL. Subsequent CT scan was obtained showing bilateral ovarian masses. The right measured 5.5 x 5.5 x 6.3 cm in the left measured 6.3 x 5.1 x 5.1 cm. There is no evidence of free fluid and only minimal prominence of a lymph node in the right lower quadrantThe patient has a remote history of breast cancer.  Family history may be significant in that her mother died at age 11 of uterine or cervix cancer, a sister at age 65 of breast cancer, and a maternal aunt may have had ovarian cancer. She is menopausal and not taking any hormone replacement therapy.   She underwent exploratory laparotomy on 08/23/2012. Both ovaries were enlarged with surface excrescences. On frozen section the  tumors were considered to be borderline tumors. We proceeded with full surgical staging including pelvic and aortic lymphadenectomy, omentectomy, and peritoneal biopsies. Final pathology revealed a low malignant potential tumor with focal areas of microinvasion. All biopsies be on the ovaries were negative for metastatic disease. The patient had an uncomplicated postoperative course. Given the final pathology no adjuvant therapy was recommended.   Review of Systems:10 point review of systems is negative except as noted in interval history.   Vitals: Blood pressure 114/64, pulse 63, temperature 98 F (36.7 C), temperature source Oral, resp. rate 16, height 5' 5.95" (1.675 m), weight 141 lb 9.6 oz (64.229 kg).  Physical Exam: General : The patient is a healthy woman in no acute distress.  HEENT: normocephalic, extraoccular movements normal; neck is supple without thyromegally  Lynphnodes: Supraclavicular and inguinal nodes not enlarged  Abdomen: Soft, non-tender, no ascites, no organomegally, no masses, no hernias  Pelvic:  EGBUS: Normal female  Vagina: Normal, no lesions  Urethra and Bladder: Normal, non-tender  Cervix: Surgically absent  Uterus: Surgically absent  Bi-manual examination: Non-tender; no adenxal masses or nodularity  Rectal: normal sphincter tone, no masses, no blood  Lower extremities: No edema or varicosities. Normal range of motion      No Known Allergies  Past Medical History  Diagnosis Date  . Chronic kidney disease     kidney stone now ct scan  . Cancer     Breast,sential node  . Tear of meniscus of knee joint  presently has a tear left knee    Past Surgical History  Procedure Laterality Date  . Breast lumpectomy  2005    RIGHT  . Appendectomy  AGE 69  . Salpingoophorectomy  08/23/2012    Procedure: SALPINGO OOPHORECTOMY;  Surgeon: Jeannette Corpus, MD;  Location: [facility];  Service: Gynecology;  Laterality: Bilateral;  . Omentectomy  08/23/2012     Procedure: OMENTECTOMY;  Surgeon: Jeannette Corpus, MD;  Location: [facility];  Service: Gynecology;;  . Abdominal hysterectomy  08/23/2012    Procedure: HYSTERECTOMY ABDOMINAL;  Surgeon: Jeannette Corpus, MD;  Location: [facility];  Service: Gynecology;  Laterality: N/A;  Perineal biopsies  . Lymphadenectomy  08/23/2012    Procedure: LYMPHADENECTOMY;  Surgeon: Jeannette Corpus, MD;  Location: [facility];  Service: Gynecology;;  Pelvic and para-aortic    Current Outpatient Prescriptions  Medication Sig Dispense Refill  . cholecalciferol (VITAMIN D) 1000 UNITS tablet Take 1,000 Units by mouth daily.      Marland Kitchen enoxaparin (LOVENOX) 40 MG/0.4ML injection Inject 0.4 mLs (40 mg total) into the skin daily.  21 Syringe  0  . ibuprofen (ADVIL,MOTRIN) 200 MG tablet Take 200 mg by mouth every 6 (six) hours as needed.      . Multiple Vitamin (MULTIVITAMIN WITH MINERALS) TABS Take 1 tablet by mouth daily.      Marland Kitchen oxyCODONE-acetaminophen (PERCOCET/ROXICET) 5-325 MG per tablet Take 1-2 tablets by mouth every 4 (four) hours as needed (moderate to severe pain (when tolerating fluids)).  60 tablet  0  . vitamin C (ASCORBIC ACID) 500 MG tablet Take 500 mg by mouth 2 (two) times daily.       No current facility-administered medications for this visit.    History   Social History  . Marital Status: Married    Spouse Name: N/A    Number of Children: N/A  . Years of Education: N/A   Occupational History  . Not on file.   Social History Main Topics  . Smoking status: Never Smoker   . Smokeless tobacco: Never Used  . Alcohol Use: Yes     Comment: RARE MAYBE 2X MONTH  . Drug Use: No  . Sexual Activity: Yes   Other Topics Concern  . Not on file   Social History Narrative  . No narrative on file    Family History  Problem Relation Age of Onset  . Cancer Mother     UTERINE AGE 682  . Cancer Sister     BREAST AGE 29  . Cancer Maternal Aunt     Ovarian cancer       CLARKE-PEARSON,Darrold Bezek L, MD 08/04/2013, 12:58 PM

## 2013-08-07 ENCOUNTER — Telehealth: Payer: Self-pay | Admitting: Gynecologic Oncology

## 2013-08-07 NOTE — Telephone Encounter (Signed)
Patient notified of upcoming appointment with Dr. Alfredo Martinez at [facility] for 08/14/13 at 9:45 am.  Verbalizing understanding.  She states that she is going to call to see if she can get an earlier appointment.  She reports significant concerns about her after visit summary printing medications that she is not taking.  She states that during check in for her appointments, she states that she is no longer taking several medications but they still remain on her list.  Informed that the medications would be removed and the appropriate staff would be educated on removing discontinued medications.  Instructed to call for any needs or concerns.

## 2013-12-29 ENCOUNTER — Telehealth: Payer: Self-pay | Admitting: *Deleted

## 2013-12-29 ENCOUNTER — Telehealth: Payer: Self-pay | Admitting: Obstetrics & Gynecology

## 2013-12-29 NOTE — Telephone Encounter (Signed)
Pt returning call to [place]. Pt was not very nice when she stressed that she only wanted to speak with dr Sabra Heck. Says she ask for Dr Sabra Heck and not the nurse.

## 2013-12-29 NOTE — Telephone Encounter (Signed)
Patient has been seeing an oncologist and only wants to talk to dr Sabra Heck.

## 2013-12-29 NOTE — Telephone Encounter (Signed)
Message left to return call to Glenn Christo at [phone].    

## 2013-12-29 NOTE — Telephone Encounter (Signed)
Pt called will be out of town on 4/10, pt r/s appt for 5/15 at 2:45pm. Pt requested CA 125 be drwn prior to appt. Pt will call back with date and time she is available to come in for labs to be drawn.

## 2013-12-29 NOTE — Telephone Encounter (Signed)
Routing to Dr. Miller

## 2014-01-10 NOTE — Telephone Encounter (Signed)
Lauren Calhoun This is pt who requested I call her.  Can you reach out?  THanks.  If she refuses to talk to you.  That is fine and hopefully I will have some time Friday afternoon.

## 2014-01-10 NOTE — Telephone Encounter (Signed)
Call to patient. She is asking for Dr Ammie Ferrier advise regarding who to see as provider when released from oncologist.  Currently still on Q6 month visits. States she never gets sick "except when I get cancer" and needs to know if Dr Sabra Heck can serve as her PCP. Advised patient that although we can address some primary care issues when patient is here for GYN issue, not best to use GYN as PCP since there will be some issues we cannot fully address.    Patient states she no longer needs pap smears so if she establishes with PCP, when does she need to come here, if at all?  Advised that we would still recommend yearly annual exam, including breast and pelvic exam.  If her PCP is comfortable with breast/pelvic exam and monitoring of MMG and BMD testing and she does not need meds from [place], it is possible that she may not need [place] unless problem arises.  Patient has history of breast cancer 9 years ago and hysterectomy for ovarian cancer. Advised again, that yearly physical with breast and pelvic exam is recommended either by [place] or PCP.  She states she is not currently getting breast exam anywhere.  Will speak with oncologist and see if they want her to see [place] for this or if they will manage it until she is released from their care.         Patient requests i review this call with Dr Sabra Heck for agreement or additional feedback.  Advised that Dr Sabra Heck reviews all of her triage calls and we will notify her is there are any changes.

## 2014-01-11 NOTE — Telephone Encounter (Signed)
Call to patient and notified Dr Sabra Heck agrees with info given in call.  Patient will plan to return to our office when released from oncology and will establish with PCP.

## 2014-01-11 NOTE — Telephone Encounter (Signed)
Agree with information given.  She had a borderline ovarian tumor and even after being released from oncology will need pelvic exams.  Of course, with breast cancer, needs breast exams.  Encounter closed.

## 2014-01-12 ENCOUNTER — Ambulatory Visit: Payer: BC Managed Care – PPO | Admitting: Gynecology

## 2014-02-12 ENCOUNTER — Other Ambulatory Visit: Payer: Self-pay | Admitting: Gynecologic Oncology

## 2014-02-12 DIAGNOSIS — C50919 Malignant neoplasm of unspecified site of unspecified female breast: Secondary | ICD-10-CM

## 2014-02-12 DIAGNOSIS — D3910 Neoplasm of uncertain behavior of unspecified ovary: Secondary | ICD-10-CM

## 2014-02-12 NOTE — Progress Notes (Signed)
Called patient about making a lab appt.  Pt requesting to have her CA 125 and CA 27.29 drawn prior to appt.  Last CA 27.29 drawn by Dr. Jana Hakim.  Message left.

## 2014-02-13 ENCOUNTER — Telehealth: Payer: Self-pay | Admitting: *Deleted

## 2014-02-13 DIAGNOSIS — D4959 Neoplasm of unspecified behavior of other genitourinary organ: Secondary | ICD-10-CM

## 2014-02-13 NOTE — Telephone Encounter (Signed)
Pt called states " I need my labs drawn prior to seeing Dr. Judeth Porch on 5/15. I'd like the labs for ovarian and breast cancer drawn along with my routine labs." Discussed with pt routine labs are not usually drawn at our office. Pt became upset states " I would appreciate if Melissa, NP would put these labs in for me as I don't have a PCP. Again discussed with pt routine labs should be drawn by her PCP. Pt again stressed " these labs really need to be drawn and I would appreciate if all the orders for the labs I've requested be put into the system." made pt appt for lab per her request at 0830am, message given to [facility], NP for review and pt concern. Per NP, BMET and CBC orders entered for pt per her request, however we will not be able to do this again in the future. Pt will need to establish a PCP for labs going forward.

## 2014-02-14 ENCOUNTER — Ambulatory Visit (HOSPITAL_BASED_OUTPATIENT_CLINIC_OR_DEPARTMENT_OTHER): Payer: BC Managed Care – PPO

## 2014-02-14 ENCOUNTER — Other Ambulatory Visit: Payer: Self-pay | Admitting: Gynecologic Oncology

## 2014-02-14 DIAGNOSIS — Z923 Personal history of irradiation: Secondary | ICD-10-CM

## 2014-02-14 DIAGNOSIS — D3910 Neoplasm of uncertain behavior of unspecified ovary: Secondary | ICD-10-CM

## 2014-02-14 DIAGNOSIS — D4959 Neoplasm of unspecified behavior of other genitourinary organ: Secondary | ICD-10-CM

## 2014-02-14 DIAGNOSIS — Z853 Personal history of malignant neoplasm of breast: Secondary | ICD-10-CM

## 2014-02-14 DIAGNOSIS — C50919 Malignant neoplasm of unspecified site of unspecified female breast: Secondary | ICD-10-CM

## 2014-02-14 LAB — CBC WITH DIFFERENTIAL/PLATELET
BASO%: 0.3 % (ref 0.0–2.0)
Basophils Absolute: 0 10*3/uL (ref 0.0–0.1)
EOS%: 1.7 % (ref 0.0–7.0)
Eosinophils Absolute: 0.1 10*3/uL (ref 0.0–0.5)
HCT: 43.3 % (ref 34.8–46.6)
HGB: 14.6 g/dL (ref 11.6–15.9)
LYMPH%: 36.9 % (ref 14.0–49.7)
MCH: 30.2 pg (ref 25.1–34.0)
MCHC: 33.7 g/dL (ref 31.5–36.0)
MCV: 89.5 fL (ref 79.5–101.0)
MONO#: 0.3 10*3/uL (ref 0.1–0.9)
MONO%: 8.2 % (ref 0.0–14.0)
NEUT#: 1.9 10*3/uL (ref 1.5–6.5)
NEUT%: 52.9 % (ref 38.4–76.8)
Platelets: 205 10*3/uL (ref 145–400)
RBC: 4.84 10*6/uL (ref 3.70–5.45)
RDW: 12.8 % (ref 11.2–14.5)
WBC: 3.5 10*3/uL — ABNORMAL LOW (ref 3.9–10.3)
lymph#: 1.3 10*3/uL (ref 0.9–3.3)

## 2014-02-14 LAB — BASIC METABOLIC PANEL (CC13)
Anion Gap: 11 mEq/L (ref 3–11)
BUN: 14.2 mg/dL (ref 7.0–26.0)
CO2: 25 mEq/L (ref 22–29)
Calcium: 10.2 mg/dL (ref 8.4–10.4)
Chloride: 109 mEq/L (ref 98–109)
Creatinine: 1 mg/dL (ref 0.6–1.1)
Glucose: 97 mg/dl (ref 70–140)
Potassium: 4.5 mEq/L (ref 3.5–5.1)
Sodium: 145 mEq/L (ref 136–145)

## 2014-02-14 LAB — CA 125: CA 125: 11.2 U/mL (ref 0.0–30.2)

## 2014-02-14 LAB — CANCER ANTIGEN 27.29: CA 27.29: 23 U/mL (ref 0–39)

## 2014-02-16 ENCOUNTER — Ambulatory Visit: Payer: BC Managed Care – PPO | Attending: Gynecology | Admitting: Gynecology

## 2014-02-16 ENCOUNTER — Encounter: Payer: Self-pay | Admitting: Gynecology

## 2014-02-16 VITALS — BP 112/54 | HR 58 | Temp 98.3°F | Resp 18 | Ht 65.0 in | Wt 143.3 lb

## 2014-02-16 DIAGNOSIS — Z90710 Acquired absence of both cervix and uterus: Secondary | ICD-10-CM | POA: Insufficient documentation

## 2014-02-16 DIAGNOSIS — Z8049 Family history of malignant neoplasm of other genital organs: Secondary | ICD-10-CM | POA: Insufficient documentation

## 2014-02-16 DIAGNOSIS — Z9079 Acquired absence of other genital organ(s): Secondary | ICD-10-CM | POA: Insufficient documentation

## 2014-02-16 DIAGNOSIS — N3941 Urge incontinence: Secondary | ICD-10-CM | POA: Insufficient documentation

## 2014-02-16 DIAGNOSIS — Z853 Personal history of malignant neoplasm of breast: Secondary | ICD-10-CM | POA: Insufficient documentation

## 2014-02-16 DIAGNOSIS — D3910 Neoplasm of uncertain behavior of unspecified ovary: Secondary | ICD-10-CM

## 2014-02-16 DIAGNOSIS — Z01419 Encounter for gynecological examination (general) (routine) without abnormal findings: Secondary | ICD-10-CM | POA: Insufficient documentation

## 2014-02-16 DIAGNOSIS — Z8041 Family history of malignant neoplasm of ovary: Secondary | ICD-10-CM | POA: Insufficient documentation

## 2014-02-16 DIAGNOSIS — Z803 Family history of malignant neoplasm of breast: Secondary | ICD-10-CM | POA: Insufficient documentation

## 2014-02-16 DIAGNOSIS — Z780 Asymptomatic menopausal state: Secondary | ICD-10-CM | POA: Insufficient documentation

## 2014-02-16 NOTE — Patient Instructions (Signed)
Return in 6 months

## 2014-02-16 NOTE — Progress Notes (Signed)
Consult Note: Gyn-Onc   [name] 63 y.o. female  Chief Complaint  Patient presents with  . 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 Patient's clinically free of disease.  Urinary urgency consistent with an unstable bladder. (Much improved with Kegel exercises)  Plan: She'll return in 6 months for followup and   Have a CA 125 and CA 27.29 just prior to that visit.   Interval History:  The patient returns today for her regular scheduled visits. Since her last visit she reports that she's return to full levels of activity including walking 5 miles a day and taking over classes nearly daily.. She feels quite well. Her recent CA 125 is 11.2 units per mL.  Per chief complaint is of urinary incontinence and urgency. It is better when she performs Kegel exercises..   She denies any other GI or GU symptoms and denies any pelvic pain or pressure vaginal bleeding or discharge.Her functional status is excellent.  HPI:63 year old white married female seen in consultation request of Dr. Edwinna Areola regarding bilateral ovarian neoplasms and an elevated CA 125. On routine laboratory testing a CA 125 was obtained and returned as 229 units per mL. Subsequent CT scan was obtained showing bilateral ovarian masses. The right measured 5.5 x 5.5 x 6.3 cm in the left measured 6.3 x 5.1 x 5.1 cm. There is no evidence of free fluid and only minimal prominence of a lymph node in the right lower quadrantThe patient has a remote history of breast cancer.  Family history may be significant in that her mother died at age 46 of uterine or cervix cancer, a sister at age 37 of breast cancer, and a maternal aunt may have had ovarian cancer. She is menopausal and not taking any hormone replacement therapy.   She underwent exploratory laparotomy on 08/23/2012. Both ovaries were enlarged with surface excrescences. On frozen section the tumors were  considered to be borderline tumors. We proceeded with full surgical staging including pelvic and aortic lymphadenectomy, omentectomy, and peritoneal biopsies. Final pathology revealed a low malignant potential tumor with focal areas of microinvasion. All biopsies be on the ovaries were negative for metastatic disease. The patient had an uncomplicated postoperative course. Given the final pathology no adjuvant therapy was recommended.   Review of Systems:10 point review of systems is negative except as noted in interval history.   Vitals: Blood pressure 112/54, pulse 58, temperature 98.3 F (36.8 C), temperature source Oral, resp. rate 18, height 5\' 5"  (1.651 m), weight 143 lb 4.8 oz (65 kg).  Physical Exam: General : The patient is a healthy woman in no acute distress.  HEENT: normocephalic, extraoccular movements normal; neck is supple without thyromegally  Lynphnodes: Supraclavicular and inguinal nodes not enlarged  Abdomen: Soft, non-tender, no ascites, no organomegally, no masses, no hernias  Pelvic:  EGBUS: Normal female  Vagina: Normal, no lesions  Urethra and Bladder: Normal, non-tender  Cervix: Surgically absent  Uterus: Surgically absent  Bi-manual examination: Non-tender; no adenxal masses or nodularity  Rectal: normal sphincter tone, no masses, no blood  Lower extremities: No edema or varicosities. Normal range of motion      No Known Allergies  Past Medical History  Diagnosis Date  . Chronic kidney disease     kidney stone now ct scan  . Cancer     Breast,sential node  . Tear of meniscus of knee joint     presently has a  tear left knee    Past Surgical History  Procedure Laterality Date  . Breast lumpectomy  2005    RIGHT  . Appendectomy  AGE 63  . Salpingoophorectomy  08/23/2012    Procedure: SALPINGO OOPHORECTOMY;  Surgeon: Alvino Chapel, MD;  Location: [facility];  Service: Gynecology;  Laterality: Bilateral;  . Omentectomy  08/23/2012    Procedure:  OMENTECTOMY;  Surgeon: Alvino Chapel, MD;  Location: [facility];  Service: Gynecology;;  . Abdominal hysterectomy  08/23/2012    Procedure: HYSTERECTOMY ABDOMINAL;  Surgeon: Alvino Chapel, MD;  Location: [facility];  Service: Gynecology;  Laterality: N/A;  Perineal biopsies  . Lymphadenectomy  08/23/2012    Procedure: LYMPHADENECTOMY;  Surgeon: Alvino Chapel, MD;  Location: [facility];  Service: Gynecology;;  Pelvic and para-aortic    No current outpatient prescriptions on file.   No current facility-administered medications for this visit.    History   Social History  . Marital Status: Married    Spouse Name: N/A    Number of Children: N/A  . Years of Education: N/A   Occupational History  . Not on file.   Social History Main Topics  . Smoking status: Never Smoker   . Smokeless tobacco: Never Used  . Alcohol Use: Yes     Comment: RARE MAYBE 2X MONTH  . Drug Use: No  . Sexual Activity: Yes   Other Topics Concern  . Not on file   Social History Narrative  . No narrative on file    Family History  Problem Relation Age of Onset  . Cancer Mother     UTERINE AGE 33  . Cancer Sister     BREAST AGE 42  . Cancer Maternal Aunt     Ovarian cancer      Alvino Chapel, MD 02/16/2014, 3:33 PM

## 2014-08-13 ENCOUNTER — Ambulatory Visit: Payer: BC Managed Care – PPO | Attending: Gynecology | Admitting: Gynecology

## 2014-08-13 ENCOUNTER — Other Ambulatory Visit: Payer: Self-pay | Admitting: *Deleted

## 2014-08-13 ENCOUNTER — Other Ambulatory Visit: Payer: BC Managed Care – PPO

## 2014-08-13 VITALS — BP 123/77 | HR 54 | Temp 98.4°F | Resp 16 | Ht 65.0 in | Wt 139.9 lb

## 2014-08-13 DIAGNOSIS — D4959 Neoplasm of unspecified behavior of other genitourinary organ: Secondary | ICD-10-CM

## 2014-08-13 DIAGNOSIS — Z8742 Personal history of other diseases of the female genital tract: Secondary | ICD-10-CM

## 2014-08-13 DIAGNOSIS — N189 Chronic kidney disease, unspecified: Secondary | ICD-10-CM | POA: Insufficient documentation

## 2014-08-13 DIAGNOSIS — Z8049 Family history of malignant neoplasm of other genital organs: Secondary | ICD-10-CM | POA: Insufficient documentation

## 2014-08-13 DIAGNOSIS — R971 Elevated cancer antigen 125 [CA 125]: Secondary | ICD-10-CM | POA: Insufficient documentation

## 2014-08-13 DIAGNOSIS — Z803 Family history of malignant neoplasm of breast: Secondary | ICD-10-CM | POA: Insufficient documentation

## 2014-08-13 DIAGNOSIS — Z8041 Family history of malignant neoplasm of ovary: Secondary | ICD-10-CM | POA: Diagnosis not present

## 2014-08-13 DIAGNOSIS — D3910 Neoplasm of uncertain behavior of unspecified ovary: Secondary | ICD-10-CM | POA: Diagnosis present

## 2014-08-13 NOTE — Patient Instructions (Signed)
Return to see me in 6 months.

## 2014-08-13 NOTE — Progress Notes (Signed)
Consult Note: Gyn-Onc   [name] 63 y.o. female  Chief Complaint  Patient presents with  . 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 Patient's clinically free of disease.   Plan: we will contact patient with her tumor marker results were drawn this morning.She'll return in 6 months for followup and   Have a CA 125 and CA 27.29 just prior to that visit.   Interval History:  The patient returns today for her regular scheduled visits. Since her last visit she reports that she's return to full levels of activity including walking 5 miles a day and taking yoga classes nearly daily.. She feels quite well.   She denies any other GI or GU symptoms and denies any pelvic pain or pressure vaginal bleeding or discharge.Her functional status is excellent.  The patient's mother recently had a stroke.  HPI:15 year old white married female seen in consultation request of Dr. Edwinna Areola regarding bilateral ovarian neoplasms and an elevated CA 125. On routine laboratory testing a CA 125 was obtained and returned as 229 units per mL. Subsequent CT scan was obtained showing bilateral ovarian masses. The right measured 5.5 x 5.5 x 6.3 cm in the left measured 6.3 x 5.1 x 5.1 cm. There is no evidence of free fluid and only minimal prominence of a lymph node in the right lower quadrantThe patient has a remote history of breast cancer.  Family history may be significant in that her mother died at age 13 of uterine or cervix cancer, a sister at age 68 of breast cancer, and a maternal aunt may have had ovarian cancer. She is menopausal and not taking any hormone replacement therapy.   She underwent exploratory laparotomy on 08/23/2012. Both ovaries were enlarged with surface excrescences. On frozen section the tumors were considered to be borderline tumors. We proceeded with full surgical staging including pelvic and aortic lymphadenectomy, omentectomy,  and peritoneal biopsies. Final pathology revealed a low malignant potential tumor with focal areas of microinvasion. All biopsies be on the ovaries were negative for metastatic disease. The patient had an uncomplicated postoperative course. Given the final pathology no adjuvant therapy was recommended.   Review of Systems:10 point review of systems is negative except as noted in interval history.   Vitals: Blood pressure 123/77, pulse 54, temperature 98.4 F (36.9 C), temperature source Oral, resp. rate 16, height 5\' 5"  (1.651 m), weight 139 lb 14.4 oz (63.458 kg).  Physical Exam: General : The patient is a healthy woman in no acute distress.  HEENT: normocephalic, extraoccular movements normal; neck is supple without thyromegally  Lynphnodes: Supraclavicular and inguinal nodes not enlarged  Abdomen: Soft, non-tender, no ascites, no organomegally, no masses, no hernias midline incisions well-healed. Pelvic:  EGBUS: Normal female  Vagina: Normal, no lesions  Urethra and Bladder: Normal, non-tender  Cervix: Surgically absent  Uterus: Surgically absent  Bi-manual examination: Non-tender; no adenxal masses or nodularity  Rectal: normal sphincter tone, no masses, no blood  Lower extremities: No edema or varicosities. Normal range of motion      No Known Allergies  Past Medical History  Diagnosis Date  . Chronic kidney disease     kidney stone now ct scan  . Cancer     Breast,sential node  . Tear of meniscus of knee joint     presently has a tear left knee    Past Surgical History  Procedure Laterality Date  . Breast lumpectomy  2005    RIGHT  . Appendectomy  AGE 31  . Salpingoophorectomy  08/23/2012    Procedure: SALPINGO OOPHORECTOMY;  Surgeon: Alvino Chapel, MD;  Location: [facility];  Service: Gynecology;  Laterality: Bilateral;  . Omentectomy  08/23/2012    Procedure: OMENTECTOMY;  Surgeon: Alvino Chapel, MD;  Location: [facility];  Service: Gynecology;;  .  Abdominal hysterectomy  08/23/2012    Procedure: HYSTERECTOMY ABDOMINAL;  Surgeon: Alvino Chapel, MD;  Location: [facility];  Service: Gynecology;  Laterality: N/A;  Perineal biopsies  . Lymphadenectomy  08/23/2012    Procedure: LYMPHADENECTOMY;  Surgeon: Alvino Chapel, MD;  Location: [facility];  Service: Gynecology;;  Pelvic and para-aortic    No current outpatient prescriptions on file.   No current facility-administered medications for this visit.    History   Social History  . Marital Status: Married    Spouse Name: N/A    Number of Children: N/A  . Years of Education: N/A   Occupational History  . Not on file.   Social History Main Topics  . Smoking status: Never Smoker   . Smokeless tobacco: Never Used  . Alcohol Use: Yes     Comment: RARE MAYBE 2X MONTH  . Drug Use: No  . Sexual Activity: Yes   Other Topics Concern  . Not on file   Social History Narrative  . No narrative on file    Family History  Problem Relation Age of Onset  . Cancer Mother     UTERINE AGE 81  . Cancer Sister     BREAST AGE 302  . Cancer Maternal Aunt     Ovarian cancer      CLARKE-PEARSON,Kani Jobson L, MD 08/13/2014, 11:02 AM

## 2014-08-14 ENCOUNTER — Telehealth: Payer: Self-pay | Admitting: *Deleted

## 2014-08-14 LAB — CA 125: CA 125: 13 U/mL (ref <35)

## 2014-08-14 LAB — CANCER ANTIGEN 27.29: CA 27.29: 19 U/mL (ref 0–39)

## 2014-08-14 LAB — CA 125(PREVIOUS METHOD): CA 125: 9 U/mL (ref 0.0–30.2)

## 2014-08-14 NOTE — Telephone Encounter (Signed)
Notified pt CA 125 13 and normal. Pt verbalized understanding

## 2014-08-14 NOTE — Telephone Encounter (Signed)
-----   Message from Dorothyann Gibbs, NP sent at 08/14/2014  1:03 PM EST ----- Please let her know that her CA 125 was 13 and normal ----- Message -----    From: [facility]    Sent: 08/14/2014   3:39 AM      To: Alvino Chapel, MD

## 2014-08-17 ENCOUNTER — Ambulatory Visit: Payer: BC Managed Care – PPO | Admitting: Gynecology

## 2014-11-12 ENCOUNTER — Ambulatory Visit (INDEPENDENT_AMBULATORY_CARE_PROVIDER_SITE_OTHER): Payer: BLUE CROSS/BLUE SHIELD | Admitting: Sports Medicine

## 2014-11-12 ENCOUNTER — Ambulatory Visit (INDEPENDENT_AMBULATORY_CARE_PROVIDER_SITE_OTHER): Payer: BLUE CROSS/BLUE SHIELD | Admitting: Internal Medicine

## 2014-11-12 ENCOUNTER — Encounter: Payer: Self-pay | Admitting: Sports Medicine

## 2014-11-12 ENCOUNTER — Encounter: Payer: Self-pay | Admitting: Internal Medicine

## 2014-11-12 VITALS — BP 116/84 | HR 63 | Temp 97.8°F

## 2014-11-12 VITALS — BP 113/75 | HR 70 | Ht 66.0 in | Wt 139.0 lb

## 2014-11-12 DIAGNOSIS — Z853 Personal history of malignant neoplasm of breast: Secondary | ICD-10-CM

## 2014-11-12 DIAGNOSIS — M542 Cervicalgia: Secondary | ICD-10-CM | POA: Diagnosis not present

## 2014-11-12 DIAGNOSIS — Z0000 Encounter for general adult medical examination without abnormal findings: Secondary | ICD-10-CM | POA: Diagnosis not present

## 2014-11-12 DIAGNOSIS — S300XXA Contusion of lower back and pelvis, initial encounter: Secondary | ICD-10-CM

## 2014-11-12 DIAGNOSIS — Z8543 Personal history of malignant neoplasm of ovary: Secondary | ICD-10-CM

## 2014-11-12 DIAGNOSIS — M791 Myalgia: Secondary | ICD-10-CM | POA: Diagnosis not present

## 2014-11-12 DIAGNOSIS — E559 Vitamin D deficiency, unspecified: Secondary | ICD-10-CM

## 2014-11-12 DIAGNOSIS — R5383 Other fatigue: Secondary | ICD-10-CM

## 2014-11-12 DIAGNOSIS — F411 Generalized anxiety disorder: Secondary | ICD-10-CM

## 2014-11-12 DIAGNOSIS — S161XXA Strain of muscle, fascia and tendon at neck level, initial encounter: Secondary | ICD-10-CM

## 2014-11-12 DIAGNOSIS — M7918 Myalgia, other site: Secondary | ICD-10-CM

## 2014-11-12 DIAGNOSIS — W009XXD Unspecified fall due to ice and snow, subsequent encounter: Secondary | ICD-10-CM

## 2014-11-12 DIAGNOSIS — H8110 Benign paroxysmal vertigo, unspecified ear: Secondary | ICD-10-CM

## 2014-11-12 LAB — POCT URINALYSIS DIPSTICK
Bilirubin, UA: NEGATIVE
Glucose, UA: NEGATIVE
Ketones, UA: NEGATIVE
Leukocytes, UA: NEGATIVE
Nitrite, UA: NEGATIVE
Protein, UA: NEGATIVE
Spec Grav, UA: 1.01
Urobilinogen, UA: NEGATIVE
pH, UA: 6.5

## 2014-11-12 MED ORDER — CYCLOBENZAPRINE HCL 10 MG PO TABS: ORAL_TABLET | ORAL | Status: DC

## 2014-11-12 MED ORDER — MELOXICAM 15 MG PO TABS: 15.0000 mg | ORAL_TABLET | Freq: Every day | ORAL | Status: DC
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
## 2014-11-12 NOTE — Patient Instructions (Signed)
Return tomorrow for fasting labs. X-rays of C-spine and LS-spine as well as chest x-ray PA and lateral. Return in 2 weeks. Take meloxicam 15 mg daily and Flexeril 1/2-1 tablet at bedtime

## 2014-11-13 ENCOUNTER — Telehealth: Payer: Self-pay | Admitting: *Deleted

## 2014-11-13 ENCOUNTER — Other Ambulatory Visit: Payer: BLUE CROSS/BLUE SHIELD | Admitting: Internal Medicine

## 2014-11-13 ENCOUNTER — Ambulatory Visit
Admission: RE | Admit: 2014-11-13 | Discharge: 2014-11-13 | Disposition: A | Discharge status: Home / Self Care | Payer: BLUE CROSS/BLUE SHIELD | Source: Ambulatory Visit | Attending: Internal Medicine | Admitting: Internal Medicine

## 2014-11-13 DIAGNOSIS — M7918 Myalgia, other site: Secondary | ICD-10-CM

## 2014-11-13 DIAGNOSIS — Z130 Encounter for screening for diseases of the blood and blood-forming organs and certain disorders involving the immune mechanism: Secondary | ICD-10-CM

## 2014-11-13 DIAGNOSIS — Z13220 Encounter for screening for lipoid disorders: Secondary | ICD-10-CM

## 2014-11-13 DIAGNOSIS — Z0000 Encounter for general adult medical examination without abnormal findings: Secondary | ICD-10-CM

## 2014-11-13 DIAGNOSIS — Z1321 Encounter for screening for nutritional disorder: Secondary | ICD-10-CM

## 2014-11-13 DIAGNOSIS — Z1329 Encounter for screening for other suspected endocrine disorder: Secondary | ICD-10-CM

## 2014-11-13 DIAGNOSIS — R5383 Other fatigue: Secondary | ICD-10-CM

## 2014-11-13 LAB — CBC WITH DIFFERENTIAL/PLATELET
Basophils Absolute: 0 10*3/uL (ref 0.0–0.1)
Basophils Relative: 0 % (ref 0–1)
Eosinophils Absolute: 0.1 10*3/uL (ref 0.0–0.7)
Eosinophils Relative: 2 % (ref 0–5)
HCT: 40.1 % (ref 36.0–46.0)
Hemoglobin: 13.7 g/dL (ref 12.0–15.0)
Lymphocytes Relative: 38 % (ref 12–46)
Lymphs Abs: 1.4 10*3/uL (ref 0.7–4.0)
MCH: 29.8 pg (ref 26.0–34.0)
MCHC: 34.2 g/dL (ref 30.0–36.0)
MCV: 87.2 fL (ref 78.0–100.0)
MPV: 9.6 fL (ref 8.6–12.4)
Monocytes Absolute: 0.2 10*3/uL (ref 0.1–1.0)
Monocytes Relative: 6 % (ref 3–12)
Neutro Abs: 2.1 10*3/uL (ref 1.7–7.7)
Neutrophils Relative %: 54 % (ref 43–77)
Platelets: 229 10*3/uL (ref 150–400)
RBC: 4.6 MIL/uL (ref 3.87–5.11)
RDW: 14.1 % (ref 11.5–15.5)
WBC: 3.8 10*3/uL — ABNORMAL LOW (ref 4.0–10.5)

## 2014-11-13 LAB — COMPREHENSIVE METABOLIC PANEL
ALT: 15 U/L (ref 0–35)
AST: 18 U/L (ref 0–37)
Albumin: 3.8 g/dL (ref 3.5–5.2)
Alkaline Phosphatase: 101 U/L (ref 39–117)
BUN: 16 mg/dL (ref 6–23)
CO2: 29 mEq/L (ref 19–32)
Calcium: 9.6 mg/dL (ref 8.4–10.5)
Chloride: 107 mEq/L (ref 96–112)
Creat: 0.81 mg/dL (ref 0.50–1.10)
Glucose, Bld: 83 mg/dL (ref 70–99)
Potassium: 4.5 mEq/L (ref 3.5–5.3)
Sodium: 143 mEq/L (ref 135–145)
Total Bilirubin: 0.8 mg/dL (ref 0.2–1.2)
Total Protein: 6.2 g/dL (ref 6.0–8.3)

## 2014-11-13 LAB — LIPID PANEL
Cholesterol: 192 mg/dL (ref 0–200)
HDL: 62 mg/dL (ref >39)
LDL Cholesterol: 115 mg/dL — ABNORMAL HIGH (ref 0–99)
Total CHOL/HDL Ratio: 3.1 Ratio
Triglycerides: 75 mg/dL (ref <150)
VLDL: 15 mg/dL (ref 0–40)

## 2014-11-13 LAB — T4, FREE: Free T4: 1.05 ng/dL (ref 0.80–1.80)

## 2014-11-13 LAB — TSH: TSH: 1.831 u[IU]/mL (ref 0.350–4.500)

## 2014-11-13 NOTE — Telephone Encounter (Signed)
Reviewed x ray results with patient

## 2014-11-14 LAB — VITAMIN D 25 HYDROXY (VIT D DEFICIENCY, FRACTURES): Vit D, 25-Hydroxy: 26 ng/mL — ABNORMAL LOW (ref 30–100)

## 2014-11-23 ENCOUNTER — Telehealth: Payer: Self-pay | Admitting: *Deleted

## 2014-11-23 NOTE — Telephone Encounter (Signed)
Patient called stated that due to employment issues she cant make her appt on Monday . She states that after taking the muscle relaxer for 3 nights and sleeping well her symptoms have resolved. She wanted to know if she can just reschedule if symptoms reoccur . Please advise

## 2014-11-23 NOTE — Telephone Encounter (Signed)
Yes, may return as needed for pain  However, she needs PE here once a year if we are to remain her primary care doctors.

## 2014-11-23 NOTE — Telephone Encounter (Signed)
Notified patient let her know we need to see her yearly for physical. She stated that she will keep up with that.

## 2014-11-26 ENCOUNTER — Ambulatory Visit: Payer: BLUE CROSS/BLUE SHIELD | Admitting: Internal Medicine

## 2014-12-10 ENCOUNTER — Telehealth: Payer: Self-pay | Admitting: *Deleted

## 2014-12-10 ENCOUNTER — Other Ambulatory Visit: Payer: Self-pay | Admitting: *Deleted

## 2014-12-10 DIAGNOSIS — C50919 Malignant neoplasm of unspecified site of unspecified female breast: Secondary | ICD-10-CM

## 2014-12-10 DIAGNOSIS — D3910 Neoplasm of uncertain behavior of unspecified ovary: Secondary | ICD-10-CM

## 2014-12-10 NOTE — Telephone Encounter (Signed)
Anyssa called early am and left message with on call nurse service-  Message states pt is requesting a new referral to the lymphedema clinic. Pt was advised to return call to office during operating hours.  This RN called pt at 130 pm post no call from pt and called report per above.  Thora states she woke up mid night Saturday ( going into Sunday ) "and my arm felt massive- could not oppose my thumb to my finger "  Icie states " I was doing a ton of lifting,tugging and pulling " regarding activity prior to above onset.  Teona also states she knows massage from lymphedema therapy "but I was taught how to massage to drain to the lymph nodes in my legs and Dr CP removed lymph nodes there - so I do not think I should be massaging fluid to drain to my legs "  At present Matilyn states arm is better but she is concerned due to planned trip on Wednesday that includes flying.  Per call this RN placed lymphedema referral and obtained appointment for pm tomorrow.  Returned call to pt to inform and obtained identified VM- detailed message left per above.

## 2014-12-11 ENCOUNTER — Ambulatory Visit: Payer: BLUE CROSS/BLUE SHIELD | Admitting: Sports Medicine

## 2014-12-11 ENCOUNTER — Ambulatory Visit: Payer: BLUE CROSS/BLUE SHIELD | Attending: Oncology | Admitting: Physical Therapy

## 2014-12-11 DIAGNOSIS — I890 Lymphedema, not elsewhere classified: Secondary | ICD-10-CM | POA: Diagnosis not present

## 2014-12-11 NOTE — Patient Instructions (Addendum)
[email or web address] Courses Online Strength ABC Right of page  lyphedema education session.  Keep tg soft folded down so it will not roll
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
## 2014-12-26 ENCOUNTER — Telehealth: Payer: Self-pay | Admitting: Nurse Practitioner

## 2014-12-26 NOTE — Telephone Encounter (Signed)
Patient calling to inquire about number of lymph nodes and locations removed during surgery. MD is Clarke-Pearson. Message given to Joylene John, NP to advise patient.

## 2014-12-31 ENCOUNTER — Ambulatory Visit: Payer: BLUE CROSS/BLUE SHIELD | Admitting: Physical Therapy

## 2014-12-31 DIAGNOSIS — I890 Lymphedema, not elsewhere classified: Secondary | ICD-10-CM

## 2014-12-31 NOTE — Therapy (Addendum)
[place], [place], [address] Phone: [phone]   Fax:  [phone]  Physical Therapy Treatment  Patient Details  Name: Lauren Calhoun MRN: [id number] Date of Birth: April 06, 1951 Referring Provider:  Elby Showers, MD  Encounter Date: 12/31/2014      PT End of Session - 12/31/14 1017    Visit Number 2   Number of Visits 4   Date for PT Re-Evaluation 01/08/15   PT Start Time 0935   PT Stop Time 1015   PT Time Calculation (min) 40 min      Past Medical History  Diagnosis Date  . Chronic kidney disease     kidney stone now ct scan  . Cancer     Breast,sential node  . Tear of meniscus of knee joint     presently has a tear left knee    Past Surgical History  Procedure Laterality Date  . Breast lumpectomy  2005    RIGHT  . Appendectomy  AGE 64  . Salpingoophorectomy  08/23/2012    Procedure: SALPINGO OOPHORECTOMY;  Surgeon: Alvino Chapel, MD;  Location: [facility];  Service: Gynecology;  Laterality: Bilateral;  . Omentectomy  08/23/2012    Procedure: OMENTECTOMY;  Surgeon: Alvino Chapel, MD;  Location: [facility];  Service: Gynecology;;  . Abdominal hysterectomy  08/23/2012    Procedure: HYSTERECTOMY ABDOMINAL;  Surgeon: Alvino Chapel, MD;  Location: [facility];  Service: Gynecology;  Laterality: N/A;  Perineal biopsies  . Lymphadenectomy  08/23/2012    Procedure: LYMPHADENECTOMY;  Surgeon: Alvino Chapel, MD;  Location: [facility];  Service: Gynecology;;  Pelvic and para-aortic    There were no vitals filed for this visit.  Visit Diagnosis:  Lymphedema of upper extremity      Subjective Assessment - 12/31/14 0939    Symptoms pt wants to establish a baseline manual lymph drainage for self care.  and learn the strength ABC program   Pertinent History 08/23/2012 TAH with 8 inguinal nodes removed from each side, 7 lymph nodes removed from abdominal area.   Currently  in Pain? No/denies           Manual lymph drainage in supine as follows: short neck, left axillary nodes, right inguinal nodes, superficial and deep abdominals; anterior inter-axillary anastamoses, right axillo-inguinal anastamoses. Right upper extremity from fingers and dorsal hand to lateral shoulder redirecting along pathways. Instruction in self manual lymph drainage treatment performed througout session         PT Education - 12/31/14 1016    Education provided Yes   Education Details instucted in self manual lymph drainage and issued klose training DVD and handout for rigth upper extremtiy.   Person(s) Educated Patient   Methods Explanation;Demonstration;Handout;Verbal cues   Comprehension Verbalized understanding           Short Term Clinic Goals - 12/31/14 1020    CC Short Term Goal  #1   Title short term goals = long term goals             Long Term Clinic Goals - 12/31/14 1019    CC Long Term Goal  #1   Title pt will verbalize understanding of lymphedema management at home including self manual lymph drainage, exercise and compression garment wearing   Status Achieved   CC Long Term Goal  #2   Title Pt will demonstrated performance of Strength ABC program with good biomechanical form.   Status On-going   CC  Long Term Goal  #3   Title pt will demonstrate self manual lymph drainage techniques without direction to inguinal nodes   Status Achieved            Plan - 12/31/14 1017    Clinical Impression Statement pt did well with using compression and exercise during flight and had no increase in symptoms. She received eduction about self manual lymph drainage and asked appropriate questions demonstrating understanding of importance of chest decongestion with minor stimulation to inguinal nodes.  Goal #1 and 3 achieved   PT Next Visit Plan Instruct in strength ABC program.  Likely discharge this visit        Problem List Patient Active Problem List    Diagnosis Date Noted  . Urinary incontinence 01/23/2013  . 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 Breast cancer 08/05/2012  . Lateral meniscus derangement 07/20/2012      PHYSICAL THERAPY DISCHARGE SUMMARY  Visits from Start of Care: 2  Current functional level related to goals / functional outcomes: As above    Remaining deficits: As above    Education / Equipment: Lymphedema risk reduction and exercise  Plan: Patient agrees to discharge.  Patient goals were partially met. Patient is being discharged due to not returning since the last visit.  ?????         Donato Heinz. Owens Shark, [place]  12/31/2014, 10:21 AM  [place], [place], [address] Phone: [phone]   Fax:  [phone]  Maudry Diego, PT 10/31/2015 10:37 AM

## 2015-02-06 ENCOUNTER — Other Ambulatory Visit: Payer: Self-pay | Admitting: *Deleted

## 2015-02-06 DIAGNOSIS — C50919 Malignant neoplasm of unspecified site of unspecified female breast: Secondary | ICD-10-CM

## 2015-02-06 DIAGNOSIS — D3910 Neoplasm of uncertain behavior of unspecified ovary: Secondary | ICD-10-CM

## 2015-02-07 ENCOUNTER — Ambulatory Visit: Payer: BLUE CROSS/BLUE SHIELD | Attending: Gynecology | Admitting: Gynecology

## 2015-02-07 ENCOUNTER — Encounter: Payer: Self-pay | Admitting: Gynecology

## 2015-02-07 ENCOUNTER — Other Ambulatory Visit: Payer: BLUE CROSS/BLUE SHIELD

## 2015-02-07 VITALS — BP 94/67 | HR 55 | Temp 97.8°F | Resp 16 | Ht 65.0 in | Wt 136.9 lb

## 2015-02-07 DIAGNOSIS — Z86018 Personal history of other benign neoplasm: Secondary | ICD-10-CM

## 2015-02-07 DIAGNOSIS — D495 Neoplasm of unspecified behavior of other genitourinary organs: Secondary | ICD-10-CM | POA: Insufficient documentation

## 2015-02-07 DIAGNOSIS — Z90710 Acquired absence of both cervix and uterus: Secondary | ICD-10-CM

## 2015-02-07 DIAGNOSIS — C50919 Malignant neoplasm of unspecified site of unspecified female breast: Secondary | ICD-10-CM

## 2015-02-07 DIAGNOSIS — D3910 Neoplasm of uncertain behavior of unspecified ovary: Secondary | ICD-10-CM

## 2015-02-07 NOTE — Patient Instructions (Signed)
We will call you with the results of your lab work.  Plan to follow up in six months or sooner if needed.

## 2015-02-07 NOTE — Progress Notes (Signed)
Consult Note: Gyn-Onc   [name] 64 y.o. female  Chief Complaint  Patient presents with  . 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 Patient's clinically free of disease.   Plan: we will contact patient with her tumor marker results.She'll return in 6 months for followup and      Interval History:  Her father passed away a few months ago and the patient has taken up walking. She's walking partially 40 miles a week. She is very happy with some weight loss and decreased blood pressure. She denies any other GI or GU symptoms and denies any pelvic pain or pressure vaginal bleeding or discharge.Her functional status is excellent.  The patient's mother recently had a stroke.  HPI:61 year old white married female seen in consultation request of Dr. Edwinna Areola regarding bilateral ovarian neoplasms and an elevated CA 125. On routine laboratory testing a CA 125 was obtained and returned as 229 units per mL. Subsequent CT scan was obtained showing bilateral ovarian masses. The right measured 5.5 x 5.5 x 6.3 cm in the left measured 6.3 x 5.1 x 5.1 cm. There is no evidence of free fluid and only minimal prominence of a lymph node in the right lower quadrantThe patient has a remote history of breast cancer.  Family history may be significant in that her mother died at age 47 of uterine or cervix cancer, a sister at age 47 of breast cancer, and a maternal aunt may have had ovarian cancer. She is menopausal and not taking any hormone replacement therapy.   She underwent exploratory laparotomy on 08/23/2012. Both ovaries were enlarged with surface excrescences. On frozen section the tumors were considered to be borderline tumors. We proceeded with full surgical staging including pelvic and aortic lymphadenectomy, omentectomy, and peritoneal biopsies. Final pathology revealed a low malignant potential tumor with focal areas of  microinvasion. All biopsies be on the ovaries were negative for metastatic disease. The patient had an uncomplicated postoperative course. Given the final pathology no adjuvant therapy was recommended.   Review of Systems:10 point review of systems is negative except as noted in interval history.   Vitals: Blood pressure 94/67, pulse 55, temperature 97.8 F (36.6 C), temperature source Oral, resp. rate 16, height 5\' 5"  (1.651 m), weight 136 lb 14.4 oz (62.097 kg).  Physical Exam: General : The patient is a healthy woman in no acute distress.  HEENT: normocephalic, extraoccular movements normal; neck is supple without thyromegally  Lynphnodes: Supraclavicular and inguinal nodes not enlarged  Abdomen: Soft, non-tender, no ascites, no organomegally, no masses, no hernias midline incisions well-healed. Pelvic:  EGBUS: Normal female  Vagina: Normal, no lesions  Urethra and Bladder: Normal, non-tender  Cervix: Surgically absent  Uterus: Surgically absent  Bi-manual examination: Non-tender; no adenxal masses or nodularity  Rectal: normal sphincter tone, no masses, no blood  Lower extremities: No edema or varicosities. Normal range of motion      No Known Allergies  Past Medical History  Diagnosis Date  . Chronic kidney disease     kidney stone now ct scan  . Cancer     Breast,sential node  . Tear of meniscus of knee joint     presently has a tear left knee    Past Surgical History  Procedure Laterality Date  . Breast lumpectomy  2005    RIGHT  . Appendectomy  AGE 34  . Salpingoophorectomy  08/23/2012    Procedure: SALPINGO  OOPHORECTOMY;  Surgeon: Alvino Chapel, MD;  Location: [facility];  Service: Gynecology;  Laterality: Bilateral;  . Omentectomy  08/23/2012    Procedure: OMENTECTOMY;  Surgeon: Alvino Chapel, MD;  Location: [facility];  Service: Gynecology;;  . Abdominal hysterectomy  08/23/2012    Procedure: HYSTERECTOMY ABDOMINAL;  Surgeon: Alvino Chapel, MD;  Location: [facility];  Service: Gynecology;  Laterality: N/A;  Perineal biopsies  . Lymphadenectomy  08/23/2012    Procedure: LYMPHADENECTOMY;  Surgeon: Alvino Chapel, MD;  Location: [facility];  Service: Gynecology;;  Pelvic and para-aortic    Current Outpatient Prescriptions  Medication Sig Dispense Refill  . Multiple Vitamin (MULTIVITAMIN) tablet Take 1 tablet by mouth daily.     No current facility-administered medications for this visit.    History   Social History  . Marital Status: Married    Spouse Name: N/A  . Number of Children: N/A  . Years of Education: N/A   Occupational History  . Not on file.   Social History Main Topics  . Smoking status: Never Smoker   . Smokeless tobacco: Never Used  . Alcohol Use: Yes     Comment: RARE MAYBE 2X MONTH  . Drug Use: No  . Sexual Activity: Yes   Other Topics Concern  . Not on file   Social History Narrative    Family History  Problem Relation Age of Onset  . Cancer Mother     UTERINE AGE 38  . Cancer Sister     BREAST AGE 80  . Cancer Maternal Aunt     Ovarian cancer      CLARKE-PEARSON,Dodd Schmid L, MD 02/07/2015, 12:07 PM

## 2015-02-08 ENCOUNTER — Telehealth: Payer: Self-pay | Admitting: *Deleted

## 2015-02-08 LAB — CA 125: CA 125: 14 U/mL (ref <35)

## 2015-02-08 LAB — CANCER ANTIGEN 27.29: CA 27.29: 23 U/mL (ref 0–39)

## 2015-02-08 NOTE — Telephone Encounter (Signed)
Per Joylene John, NP patient notified of normal CA 125 and CA 27.29 results.

## 2015-04-22 ENCOUNTER — Encounter: Payer: Self-pay | Admitting: Genetic Counselor

## 2015-04-25 LAB — HM MAMMOGRAPHY

## 2015-05-01 ENCOUNTER — Encounter: Payer: Self-pay | Admitting: *Deleted

## 2015-07-23 ENCOUNTER — Telehealth: Payer: Self-pay | Admitting: Gynecologic Oncology

## 2015-07-23 ENCOUNTER — Other Ambulatory Visit: Payer: Self-pay

## 2015-07-23 DIAGNOSIS — C50919 Malignant neoplasm of unspecified site of unspecified female breast: Secondary | ICD-10-CM

## 2015-07-23 DIAGNOSIS — D3910 Neoplasm of uncertain behavior of unspecified ovary: Secondary | ICD-10-CM

## 2015-07-23 NOTE — Telephone Encounter (Signed)
Returned call to patient.  Patient stating she feels she may have had a panic attack yesterday because she was thinking about her friends who are struggling with the same thing she went through and they are not doing well, her 3 year anniversary is coming up, and she had abdominal bloating yesterday.  She stated the bloating has resolved this am and she was able to process her feelings last pm and she is better this am.  "I am doing good."  Advised to call for any needs.  No feelings of harming herself or others.  Follow up appt in two weeks or sooner if needed.

## 2015-08-05 ENCOUNTER — Other Ambulatory Visit (HOSPITAL_BASED_OUTPATIENT_CLINIC_OR_DEPARTMENT_OTHER): Payer: BLUE CROSS/BLUE SHIELD

## 2015-08-05 DIAGNOSIS — D3910 Neoplasm of uncertain behavior of unspecified ovary: Secondary | ICD-10-CM

## 2015-08-05 DIAGNOSIS — C50919 Malignant neoplasm of unspecified site of unspecified female breast: Secondary | ICD-10-CM

## 2015-08-05 LAB — CA 125: CA 125: 14 U/mL (ref <35)

## 2015-08-05 LAB — CANCER ANTIGEN 27.29: CA 27.29: 19 U/mL (ref 0–39)

## 2015-08-08 ENCOUNTER — Ambulatory Visit: Payer: BLUE CROSS/BLUE SHIELD

## 2015-08-08 ENCOUNTER — Ambulatory Visit: Payer: BLUE CROSS/BLUE SHIELD | Attending: Gynecology | Admitting: Gynecology

## 2015-08-08 ENCOUNTER — Encounter: Payer: Self-pay | Admitting: Gynecology

## 2015-08-08 VITALS — BP 115/69 | HR 59 | Temp 97.9°F | Resp 18 | Ht 65.5 in | Wt 142.4 lb

## 2015-08-08 DIAGNOSIS — R6882 Decreased libido: Secondary | ICD-10-CM

## 2015-08-08 DIAGNOSIS — D3910 Neoplasm of uncertain behavior of unspecified ovary: Secondary | ICD-10-CM

## 2015-08-08 NOTE — Patient Instructions (Signed)
Doing well.  Plan to follow up with Dr. Fermin Schwab in six months with labwork before or sooner if needed.  We will let you know the results of your testosterone level.

## 2015-08-08 NOTE — Progress Notes (Signed)
Consult Note: Gyn-Onc   Lauren Calhoun 64 y.o. female  No chief complaint on file.   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 Patient's clinically free of disease.  Recent panic attack  Decreased libido.   Plan: I discussed with the patient differential diagnosis of decreased libido. She readily admits that there could be a significant emotional component to her current situation. However we will obtain a serum testosterone level. She'll return in 6 months for followup and  we will repeat tumor markers at that time.    Interval History:   Patient returns today as previously scheduled. Most notable is that the patient has had a significant panic attack recently although she says that she has now been able to get her emotions back under control. She recognizes the triggers of the panic attack.  From the perspective of her low malignant potential tumor, she denies any GI GU or pelvic symptoms. It's noted that she has a recent CA-125 which is 14 units per mL (previously 14) and a CA 2729 of 19 units (previously 23)  Finally, patient is quite concerned about decreased libido. This occurred approximately 4 months ago. The patient readily admits that there may be a emotional component to this but would like to have a serum testosterone level obtained. We discussed the pros and cons of obtain a serum testosterone level and the lack of certainly as to how to interpret the results.    HPI:64 year old white married female seen in consultation request of Dr. Edwinna Calhoun regarding bilateral ovarian neoplasms and an elevated CA 125. On routine laboratory testing a CA 125 was obtained and returned as 229 units per mL. Subsequent CT scan was obtained showing bilateral ovarian masses. The right measured 5.5 x 5.5 x 6.3 cm in the left measured 6.3 x 5.1 x 5.1 cm. There is no evidence of free fluid and only minimal prominence of a lymph node in the right lower quadrantThe patient  has a remote history of breast cancer.  Family history may be significant in that her mother died at age 43 of uterine or cervix cancer, a sister at age 55 of breast cancer, and a maternal aunt may have had ovarian cancer. She is menopausal and not taking any hormone replacement therapy.   She underwent exploratory laparotomy on 08/23/2012. Both ovaries were enlarged with surface excrescences. On frozen section the tumors were considered to be borderline tumors. We proceeded with full surgical staging including pelvic and aortic lymphadenectomy, omentectomy, and peritoneal biopsies. Final pathology revealed a low malignant potential tumor with focal areas of microinvasion. All biopsies were negative for metastatic disease. The patient had an uncomplicated postoperative course. Given the final pathology no adjuvant therapy was recommended.   Review of Systems:10 point review of systems is negative except as noted in interval history.   Vitals: There were no vitals taken for this visit.  Physical Exam: General : The patient is a healthy woman in no acute distress.  HEENT: normocephalic, extraoccular movements normal; neck is supple without thyromegally  Lynphnodes: Supraclavicular and inguinal nodes not enlarged  Abdomen: Soft, non-tender, no ascites, no organomegally, no masses, no hernias midline incisions well-healed. Pelvic:  EGBUS: Normal female  Vagina: Normal, no lesions  Urethra and Bladder: Normal, non-tender  Cervix: Surgically absent  Uterus: Surgically absent  Bi-manual examination: Non-tender; no adenxal masses or nodularity  Rectal: normal sphincter tone, no masses, no blood  Lower extremities: No edema or varicosities. Normal range of motion  No Known Allergies  Past Medical History  Diagnosis Date  . Chronic kidney disease     kidney stone now ct scan  . Cancer     Breast,sential node  . Tear of meniscus of knee joint     presently has a tear left knee     Past Surgical History  Procedure Laterality Date  . Breast lumpectomy  2005    RIGHT  . Appendectomy  AGE 18  . Salpingoophorectomy  08/23/2012    Procedure: SALPINGO OOPHORECTOMY;  Surgeon: Alvino Chapel, MD;  Location: [facility];  Service: Gynecology;  Laterality: Bilateral;  . Omentectomy  08/23/2012    Procedure: OMENTECTOMY;  Surgeon: Alvino Chapel, MD;  Location: [facility];  Service: Gynecology;;  . Abdominal hysterectomy  08/23/2012    Procedure: HYSTERECTOMY ABDOMINAL;  Surgeon: Alvino Chapel, MD;  Location: [facility];  Service: Gynecology;  Laterality: N/A;  Perineal biopsies  . Lymphadenectomy  08/23/2012    Procedure: LYMPHADENECTOMY;  Surgeon: Alvino Chapel, MD;  Location: [facility];  Service: Gynecology;;  Pelvic and para-aortic    Current Outpatient Prescriptions  Medication Sig Dispense Refill  . Multiple Vitamin (MULTIVITAMIN) tablet Take 1 tablet by mouth daily.     No current facility-administered medications for this visit.    Social History   Social History  . Marital Status: Married    Spouse Name: N/A  . Number of Children: N/A  . Years of Education: N/A   Occupational History  . Not on file.   Social History Main Topics  . Smoking status: Never Smoker   . Smokeless tobacco: Never Used  . Alcohol Use: Yes     Comment: RARE MAYBE 2X MONTH  . Drug Use: No  . Sexual Activity: Yes   Other Topics Concern  . Not on file   Social History Narrative    Family History  Problem Relation Age of Onset  . Cancer Mother     UTERINE AGE 8  . Cancer Sister     BREAST AGE 40  . Cancer Maternal Aunt     Ovarian cancer      CLARKE-PEARSON,Selwyn Reason L, MD 08/08/2015, 9:29 AM

## 2015-08-12 LAB — TESTOSTERONE, FREE, TOTAL, SHBG
Sex Hormone Binding: 51 nmol/L (ref 14–73)
Testosterone, Free: 1.8 pg/mL (ref 0.6–6.8)
Testosterone-% Free: 1.3 % (ref 0.4–2.4)
Testosterone: 13 ng/dL (ref 10–70)

## 2015-08-16 ENCOUNTER — Telehealth: Payer: Self-pay | Admitting: Gynecologic Oncology

## 2015-08-16 NOTE — Telephone Encounter (Signed)
Patient informed of lab results (testosterone panel).  Also informed that Dr. Fermin Schwab reviewed the values and stated she could follow up with an endocrinologist if she wanted further evaluation.  Patient verbalizing understanding.  Not wanting a referral at this time.   Advised to call the office for any questions or concerns, or if she wanted a referral placed.

## 2015-12-26 ENCOUNTER — Other Ambulatory Visit: Payer: BLUE CROSS/BLUE SHIELD | Admitting: Internal Medicine

## 2015-12-26 DIAGNOSIS — Z13220 Encounter for screening for lipoid disorders: Secondary | ICD-10-CM

## 2015-12-26 DIAGNOSIS — Z130 Encounter for screening for diseases of the blood and blood-forming organs and certain disorders involving the immune mechanism: Secondary | ICD-10-CM

## 2015-12-26 DIAGNOSIS — Z1321 Encounter for screening for nutritional disorder: Secondary | ICD-10-CM

## 2015-12-26 DIAGNOSIS — Z1329 Encounter for screening for other suspected endocrine disorder: Secondary | ICD-10-CM

## 2015-12-26 DIAGNOSIS — Z0000 Encounter for general adult medical examination without abnormal findings: Secondary | ICD-10-CM

## 2015-12-26 LAB — CBC WITH DIFFERENTIAL/PLATELET
Basophils Absolute: 0 10*3/uL (ref 0.0–0.1)
Basophils Relative: 0 % (ref 0–1)
Eosinophils Absolute: 0.1 10*3/uL (ref 0.0–0.7)
Eosinophils Relative: 2 % (ref 0–5)
HCT: 42.2 % (ref 36.0–46.0)
Hemoglobin: 14.4 g/dL (ref 12.0–15.0)
Lymphocytes Relative: 48 % — ABNORMAL HIGH (ref 12–46)
Lymphs Abs: 1.5 10*3/uL (ref 0.7–4.0)
MCH: 30 pg (ref 26.0–34.0)
MCHC: 34.1 g/dL (ref 30.0–36.0)
MCV: 87.9 fL (ref 78.0–100.0)
MPV: 9.8 fL (ref 8.6–12.4)
Monocytes Absolute: 0.2 10*3/uL (ref 0.1–1.0)
Monocytes Relative: 7 % (ref 3–12)
Neutro Abs: 1.3 10*3/uL — ABNORMAL LOW (ref 1.7–7.7)
Neutrophils Relative %: 43 % (ref 43–77)
Platelets: 213 10*3/uL (ref 150–400)
RBC: 4.8 MIL/uL (ref 3.87–5.11)
RDW: 13.6 % (ref 11.5–15.5)
WBC: 3.1 10*3/uL — ABNORMAL LOW (ref 4.0–10.5)

## 2015-12-26 LAB — COMPLETE METABOLIC PANEL WITH GFR
ALT: 15 U/L (ref 6–29)
AST: 20 U/L (ref 10–35)
Albumin: 4.3 g/dL (ref 3.6–5.1)
Alkaline Phosphatase: 73 U/L (ref 33–130)
BUN: 12 mg/dL (ref 7–25)
CO2: 27 mmol/L (ref 20–31)
Calcium: 10.1 mg/dL (ref 8.6–10.4)
Chloride: 106 mmol/L (ref 98–110)
Creat: 0.97 mg/dL (ref 0.50–0.99)
GFR, Est African American: 72 mL/min (ref >=60)
GFR, Est Non African American: 62 mL/min (ref >=60)
Glucose, Bld: 83 mg/dL (ref 65–99)
Potassium: 4.6 mmol/L (ref 3.5–5.3)
Sodium: 142 mmol/L (ref 135–146)
Total Bilirubin: 0.8 mg/dL (ref 0.2–1.2)
Total Protein: 6.8 g/dL (ref 6.1–8.1)

## 2015-12-26 LAB — LIPID PANEL
Cholesterol: 200 mg/dL (ref 125–200)
HDL: 71 mg/dL (ref >=46)
LDL Cholesterol: 114 mg/dL (ref <130)
Total CHOL/HDL Ratio: 2.8 Ratio (ref <=5.0)
Triglycerides: 76 mg/dL (ref <150)
VLDL: 15 mg/dL (ref <30)

## 2015-12-26 LAB — TSH: TSH: 2.76 mIU/L

## 2015-12-27 ENCOUNTER — Other Ambulatory Visit: Payer: BLUE CROSS/BLUE SHIELD | Admitting: Internal Medicine

## 2015-12-27 LAB — VITAMIN D 25 HYDROXY (VIT D DEFICIENCY, FRACTURES): Vit D, 25-Hydroxy: 25 ng/mL — ABNORMAL LOW (ref 30–100)

## 2015-12-30 ENCOUNTER — Ambulatory Visit (INDEPENDENT_AMBULATORY_CARE_PROVIDER_SITE_OTHER): Payer: BLUE CROSS/BLUE SHIELD | Admitting: Internal Medicine

## 2015-12-30 ENCOUNTER — Encounter: Payer: Self-pay | Admitting: Internal Medicine

## 2015-12-30 ENCOUNTER — Telehealth: Payer: Self-pay

## 2015-12-30 VITALS — BP 118/72 | HR 56 | Temp 98.2°F | Resp 18 | Ht 66.0 in | Wt 146.5 lb

## 2015-12-30 DIAGNOSIS — Z8639 Personal history of other endocrine, nutritional and metabolic disease: Secondary | ICD-10-CM | POA: Diagnosis not present

## 2015-12-30 DIAGNOSIS — D3910 Neoplasm of uncertain behavior of unspecified ovary: Secondary | ICD-10-CM

## 2015-12-30 DIAGNOSIS — Z0000 Encounter for general adult medical examination without abnormal findings: Secondary | ICD-10-CM

## 2015-12-30 DIAGNOSIS — Z853 Personal history of malignant neoplasm of breast: Secondary | ICD-10-CM | POA: Diagnosis not present

## 2015-12-30 DIAGNOSIS — M810 Age-related osteoporosis without current pathological fracture: Secondary | ICD-10-CM | POA: Diagnosis not present

## 2015-12-30 LAB — POCT URINALYSIS DIPSTICK
Bilirubin, UA: NEGATIVE
Blood, UA: NEGATIVE
Glucose, UA: NEGATIVE
Ketones, UA: NEGATIVE
Leukocytes, UA: NEGATIVE
Nitrite, UA: NEGATIVE
Protein, UA: NEGATIVE
Spec Grav, UA: <=1.005
Urobilinogen, UA: 0.2
pH, UA: 6.5

## 2015-12-30 NOTE — Telephone Encounter (Signed)
Patient states that she forgot to ask you about testosterone level. She states that she had it checked last in November. She just wants to know should we recheck this or because she is coming back in September to hold off? Please advise if you would like me to add this to her labs.

## 2015-12-30 NOTE — Telephone Encounter (Signed)
Patient notified

## 2015-12-30 NOTE — Telephone Encounter (Signed)
Dr. Jana Hakim did this November 2016. Result was normal at 13. He can repeat as needed.

## 2016-01-03 ENCOUNTER — Telehealth: Payer: Self-pay

## 2016-01-03 DIAGNOSIS — M8580 Other specified disorders of bone density and structure, unspecified site: Secondary | ICD-10-CM

## 2016-01-03 DIAGNOSIS — Z1211 Encounter for screening for malignant neoplasm of colon: Secondary | ICD-10-CM

## 2016-01-03 NOTE — Patient Instructions (Addendum)
It was pleasure to see you today. Need to have bone density in the near future. Return in one year or as needed. Take 2000 units vitamin D 3 daily.
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
## 2016-01-03 NOTE — Telephone Encounter (Signed)
I spoke with patient and she states that she will have a bone density as she cant remember when she had one if she has ever. She is also due for colonoscopy as the date of 2003 is correct. I will enter the order for GI referral as well as bone density.

## 2016-01-24 ENCOUNTER — Ambulatory Visit
Admission: RE | Admit: 2016-01-24 | Discharge: 2016-01-24 | Disposition: A | Discharge status: Home / Self Care | Payer: BLUE CROSS/BLUE SHIELD | Source: Ambulatory Visit | Attending: Internal Medicine | Admitting: Internal Medicine

## 2016-01-24 DIAGNOSIS — M8580 Other specified disorders of bone density and structure, unspecified site: Secondary | ICD-10-CM

## 2016-02-12 ENCOUNTER — Other Ambulatory Visit: Payer: BLUE CROSS/BLUE SHIELD

## 2016-02-14 ENCOUNTER — Ambulatory Visit: Payer: BLUE CROSS/BLUE SHIELD | Admitting: Gynecology

## 2016-03-04 ENCOUNTER — Other Ambulatory Visit: Payer: BLUE CROSS/BLUE SHIELD

## 2016-03-04 ENCOUNTER — Other Ambulatory Visit (HOSPITAL_BASED_OUTPATIENT_CLINIC_OR_DEPARTMENT_OTHER): Payer: BLUE CROSS/BLUE SHIELD

## 2016-03-04 DIAGNOSIS — D3910 Neoplasm of uncertain behavior of unspecified ovary: Secondary | ICD-10-CM

## 2016-03-05 LAB — CANCER ANTIGEN 27-29 (PARALLEL TESTING): CA 27.29: 20 U/mL (ref <38)

## 2016-03-05 LAB — CANCER ANTIGEN 27.29: CA 27.29: 23.5 U/mL (ref 0.0–38.6)

## 2016-03-05 LAB — CA 125: Cancer Antigen (CA) 125: 15.3 U/mL (ref 0.0–38.1)

## 2016-03-05 LAB — CANCER ANTIGEN 125 (PARALLEL TESTING): CA 125: 15 U/mL (ref <35)

## 2016-03-06 ENCOUNTER — Encounter: Payer: Self-pay | Admitting: Gynecology

## 2016-03-06 ENCOUNTER — Other Ambulatory Visit: Payer: BLUE CROSS/BLUE SHIELD

## 2016-03-06 ENCOUNTER — Ambulatory Visit: Payer: BLUE CROSS/BLUE SHIELD | Attending: Gynecology | Admitting: Gynecology

## 2016-03-06 VITALS — BP 94/68 | HR 60 | Temp 97.6°F | Resp 18 | Wt 140.2 lb

## 2016-03-06 DIAGNOSIS — N189 Chronic kidney disease, unspecified: Secondary | ICD-10-CM | POA: Diagnosis not present

## 2016-03-06 DIAGNOSIS — D3912 Neoplasm of uncertain behavior of left ovary: Secondary | ICD-10-CM | POA: Insufficient documentation

## 2016-03-06 DIAGNOSIS — D3911 Neoplasm of uncertain behavior of right ovary: Secondary | ICD-10-CM | POA: Diagnosis present

## 2016-03-06 DIAGNOSIS — D3910 Neoplasm of uncertain behavior of unspecified ovary: Secondary | ICD-10-CM

## 2016-03-06 DIAGNOSIS — Z853 Personal history of malignant neoplasm of breast: Secondary | ICD-10-CM | POA: Insufficient documentation

## 2016-03-06 NOTE — Patient Instructions (Signed)
Follow up in 6 months with Dr Fermin Schwab , call with any changes , questions or concerns.  Appointment : Aug 07, 2016   Thank you!

## 2016-03-06 NOTE — Progress Notes (Signed)
Consult Note: Gyn-Onc   Lauren Calhoun 65 y.o. female  Chief Complaint  Patient presents with  . 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 Patient's clinically free of diseaseAnd has normal tumor markers.   Plan: She'll return in 6 months for followup and  we will repeat tumor markers at that time.    Interval History:   Patient returns today as previously scheduled. Since her last visit she's done well. She denies any GI GU or pelvic symptoms.   . It's noted that she has a recent CA-125 which was 15 units per mL (previously 14) and a CA 2729 of 20 units (previously 29)    HPI:65 year old white married female seen in consultation request of Dr. Edwinna Areola regarding bilateral ovarian neoplasms and an elevated CA 125. On routine laboratory testing a CA 125 was obtained and returned as 229 units per mL. Subsequent CT scan was obtained showing bilateral ovarian masses. The right measured 5.5 x 5.5 x 6.3 cm in the left measured 6.3 x 5.1 x 5.1 cm. There is no evidence of free fluid and only minimal prominence of a lymph node in the right lower quadrantThe patient has a remote history of breast cancer.  Family history may be significant in that her mother died at age 15 of uterine or cervix cancer, a sister at age 64 of breast cancer, and a maternal aunt may have had ovarian cancer. She is menopausal and not taking any hormone replacement therapy.   She underwent exploratory laparotomy on 08/23/2012. Both ovaries were enlarged with surface excrescences. On frozen section the tumors were considered to be borderline tumors. We proceeded with full surgical staging including pelvic and aortic lymphadenectomy, omentectomy, and peritoneal biopsies. Final pathology revealed a low malignant potential tumor with focal areas of microinvasion. All biopsies were negative for metastatic disease. The patient had an uncomplicated postoperative course. Given the final  pathology no adjuvant therapy was recommended.   Review of Systems:10 point review of systems is negative except as noted in interval history.   Vitals: Blood pressure 94/68, pulse 60, temperature 97.6 F (36.4 C), temperature source Oral, resp. rate 18, weight 140 lb 3.2 oz (63.594 kg), SpO2 100 %.  Physical Exam: General : The patient is a healthy woman in no acute distress.  HEENT: normocephalic, extraoccular movements normal; neck is supple without thyromegally  Lynphnodes: Supraclavicular and inguinal nodes not enlarged  Abdomen: Soft, non-tender, no ascites, no organomegally, no masses, no hernias midline incisions well-healed. Pelvic:  EGBUS: Normal female  Vagina: Normal, no lesions  Urethra and Bladder: Normal, non-tender  Cervix: Surgically absent  Uterus: Surgically absent  Bi-manual examination: Non-tender; no adenxal masses or nodularity  Rectal: normal sphincter tone, no masses, no blood  Lower extremities: No edema or varicosities. Normal range of motion      No Known Allergies  Past Medical History  Diagnosis Date  . Chronic kidney disease     kidney stone now ct scan  . Cancer (HCC)     Breast,sential node  . Tear of meniscus of knee joint     presently has a tear left knee    Past Surgical History  Procedure Laterality Date  . Breast lumpectomy  2005    RIGHT  . Appendectomy  AGE 89  . Salpingoophorectomy  08/23/2012    Procedure: SALPINGO OOPHORECTOMY;  Surgeon: Alvino Chapel, MD;  Location: [facility];  Service: Gynecology;  Laterality: Bilateral;  .  Omentectomy  08/23/2012    Procedure: OMENTECTOMY;  Surgeon: Alvino Chapel, MD;  Location: [facility];  Service: Gynecology;;  . Abdominal hysterectomy  08/23/2012    Procedure: HYSTERECTOMY ABDOMINAL;  Surgeon: Alvino Chapel, MD;  Location: [facility];  Service: Gynecology;  Laterality: N/A;  Perineal biopsies  . Lymphadenectomy  08/23/2012    Procedure: LYMPHADENECTOMY;  Surgeon:  Alvino Chapel, MD;  Location: [facility];  Service: Gynecology;;  Pelvic and para-aortic    No current outpatient prescriptions on file.   No current facility-administered medications for this visit.    Social History   Social History  . Marital Status: Married    Spouse Name: N/A  . Number of Children: N/A  . Years of Education: N/A   Occupational History  . Not on file.   Social History Main Topics  . Smoking status: Never Smoker   . Smokeless tobacco: Never Used  . Alcohol Use: Yes     Comment: RARE MAYBE 2X MONTH  . Drug Use: No  . Sexual Activity: Yes   Other Topics Concern  . Not on file   Social History Narrative    Family History  Problem Relation Age of Onset  . Cancer Mother     UTERINE AGE 65  . Cancer Sister     BREAST AGE 39  . Cancer Maternal Aunt     Ovarian cancer      CLARKE-PEARSON,Kellina Dreese L, MD 03/06/2016, 10:04 AM

## 2016-03-09 ENCOUNTER — Telehealth: Payer: Self-pay

## 2016-03-09 NOTE — Telephone Encounter (Signed)
Patient contacted office stating that she has an appt coming up in Sept. She states that she is having labs in November 3rd with her cancer dr. She states that she would like to combine these labs and only have to pay for labs once and get stuck once. She is wanting to have her labs drawn by her cancer dr in November and have them draw ours. She also wants to know if you want to wait to see her or let her know if you even need to see her after her labs are drawn at Dr. Lafe Garin office on Nov 3rd. Please advise

## 2016-03-09 NOTE — Telephone Encounter (Signed)
We cannot see her for CPE without lab work. I do not understand what the issue is. We cannot combine labs any longer with other providers. It is simply too confusing. Something may be overlooked. Is she complaining about an additional draw fee?

## 2016-03-09 NOTE — Telephone Encounter (Signed)
Patient notified and she agrees to keep the already schedule lab and office visit appointments. Patient understood the concerns of things being overlooked and timing issues with current conditions.

## 2016-06-16 ENCOUNTER — Ambulatory Visit (INDEPENDENT_AMBULATORY_CARE_PROVIDER_SITE_OTHER): Payer: BLUE CROSS/BLUE SHIELD | Admitting: Internal Medicine

## 2016-06-16 ENCOUNTER — Encounter: Payer: Self-pay | Admitting: Internal Medicine

## 2016-06-16 VITALS — BP 100/63 | HR 63 | Temp 98.2°F | Ht 66.0 in | Wt 141.0 lb

## 2016-06-16 DIAGNOSIS — L900 Lichen sclerosus et atrophicus: Secondary | ICD-10-CM

## 2016-06-16 MED ORDER — CLOBETASOL PROPIONATE 0.05 % EX OINT: 1.0000 "application " | TOPICAL_OINTMENT | Freq: Two times a day (BID) | CUTANEOUS | 0 refills | Status: DC

## 2016-06-16 NOTE — Progress Notes (Signed)
   Subjective:    Patient ID: Lauren Calhoun, female    DOB: 08/28/1951, 65 y.o.   MRN: [id number]  HPI was diagnosed with lichen sclerosus by Dr. Edwinna Areola in 2011. Apparently had a recurrence around 2014. Was treated with clobetasol 0.05% ointment.  Patient now has recurrence. Has been doing more yoga recently and wearing yoga pants a lot.    Review of Systems see above     Objective:   Physical Exam  Pale mucosa between introitus and rectum.      Assessment & Plan:  Lichen sclerosus  Plan: Clobetasol ointment 0.05% apply topically twice daily to area for 7-10 days.

## 2016-06-16 NOTE — Patient Instructions (Signed)
Use clobetasol ointment 0.05% topically to perirectal area twice daily for 7-10 days.

## 2016-06-30 ENCOUNTER — Other Ambulatory Visit: Payer: BLUE CROSS/BLUE SHIELD | Admitting: Internal Medicine

## 2016-06-30 ENCOUNTER — Other Ambulatory Visit: Payer: Self-pay | Admitting: Internal Medicine

## 2016-06-30 DIAGNOSIS — D72819 Decreased white blood cell count, unspecified: Secondary | ICD-10-CM

## 2016-06-30 LAB — CBC WITH DIFFERENTIAL/PLATELET
Basophils Absolute: 0 cells/uL (ref 0–200)
Basophils Relative: 0 %
Eosinophils Absolute: 58 cells/uL (ref 15–500)
Eosinophils Relative: 2 %
HCT: 41.3 % (ref 35.0–45.0)
Hemoglobin: 13.8 g/dL (ref 11.7–15.5)
Lymphocytes Relative: 49 %
Lymphs Abs: 1421 cells/uL (ref 850–3900)
MCH: 29.9 pg (ref 27.0–33.0)
MCHC: 33.4 g/dL (ref 32.0–36.0)
MCV: 89.4 fL (ref 80.0–100.0)
MPV: 10.2 fL (ref 7.5–12.5)
Monocytes Absolute: 232 cells/uL (ref 200–950)
Monocytes Relative: 8 %
Neutro Abs: 1189 cells/uL — ABNORMAL LOW (ref 1500–7800)
Neutrophils Relative %: 41 %
Platelets: 214 10*3/uL (ref 140–400)
RBC: 4.62 MIL/uL (ref 3.80–5.10)
RDW: 14.4 % (ref 11.0–15.0)
WBC: 2.9 10*3/uL — ABNORMAL LOW (ref 3.8–10.8)

## 2016-07-01 LAB — PATHOLOGIST SMEAR REVIEW

## 2016-07-02 ENCOUNTER — Encounter: Payer: Self-pay | Admitting: Internal Medicine

## 2016-07-02 ENCOUNTER — Ambulatory Visit (INDEPENDENT_AMBULATORY_CARE_PROVIDER_SITE_OTHER): Payer: BLUE CROSS/BLUE SHIELD | Admitting: Internal Medicine

## 2016-07-02 VITALS — BP 118/78 | HR 62 | Temp 98.8°F | Wt 137.0 lb

## 2016-07-02 DIAGNOSIS — Z8543 Personal history of malignant neoplasm of ovary: Secondary | ICD-10-CM | POA: Diagnosis not present

## 2016-07-02 DIAGNOSIS — D72819 Decreased white blood cell count, unspecified: Secondary | ICD-10-CM | POA: Diagnosis not present

## 2016-07-02 DIAGNOSIS — L900 Lichen sclerosus et atrophicus: Secondary | ICD-10-CM

## 2016-07-02 NOTE — Patient Instructions (Signed)
Hematology appointment made for evaluation of leukopenia. Continue clobetasol ointment and genital area for an additional 10 days.

## 2016-07-02 NOTE — Progress Notes (Signed)
   Subjective:    Patient ID: Lauren Calhoun, female    DOB: 10-07-1950, 65 y.o.   MRN: [id number]  HPI She is here today to follow-up on neutropenia. White blood cell count was 2900. I called her recently around 8 PM on Tuesday, September 26. Apparently she did not receive  call. I left a voicemail message. Told her the result of her recent white blood cell count which was 2900. Recommended she see Hematologist. Martin Majestic over all of this again with her today. Reviewed White blood cell counts over the past several years. Her peripheral review of smear by pathologist just showed absolute neutropenia. She is asymptomatic. Patient is concerned about having had chemotherapy which potentially could cause leukemia. She is quite concerned about this and actually a bit tearful in the office today.  At last visit she was treated for lichen sclerosis with clobetasol cream which is what GYN physician and given her previously. She was being to check and make sure that it is improving. Instructions it pharmacy told her not to use more than 10 days should so she'll use it about 10 days.    Review of Systems     Objective:   Physical Exam Area of lichen sclerosus at [id number] at  introitus is   improving. Recommended patient use clobetasol ointment an additional 10 days       Assessment & Plan:  Leukopenia-etiology unclear  Lichen sclerosus  Plan: Continue clobetasol ointment for an additional 10 days. Hematology appointment obtained.

## 2016-07-08 ENCOUNTER — Telehealth: Payer: Self-pay | Admitting: Hematology

## 2016-07-08 ENCOUNTER — Encounter: Payer: Self-pay | Admitting: Hematology

## 2016-07-08 ENCOUNTER — Encounter: Payer: Self-pay | Admitting: Internal Medicine

## 2016-07-08 NOTE — Telephone Encounter (Signed)
Pt called in to schedule hem appt. Pt wanted appt to be scheduled on 11/3 due to another appt on the same day. Appt is scheduled w/Kale 11/3 at 9am. Demographics verified. Voiced understanding. Letter to the pt.

## 2016-08-07 ENCOUNTER — Ambulatory Visit: Payer: BLUE CROSS/BLUE SHIELD | Attending: Gynecology | Admitting: Gynecology

## 2016-08-07 ENCOUNTER — Encounter: Payer: Self-pay | Admitting: Hematology

## 2016-08-07 ENCOUNTER — Other Ambulatory Visit (HOSPITAL_BASED_OUTPATIENT_CLINIC_OR_DEPARTMENT_OTHER): Payer: BLUE CROSS/BLUE SHIELD

## 2016-08-07 ENCOUNTER — Encounter: Payer: Self-pay | Admitting: Gynecology

## 2016-08-07 ENCOUNTER — Ambulatory Visit (HOSPITAL_BASED_OUTPATIENT_CLINIC_OR_DEPARTMENT_OTHER): Payer: BLUE CROSS/BLUE SHIELD | Admitting: Hematology

## 2016-08-07 VITALS — BP 121/84 | HR 63 | Temp 98.0°F | Resp 16 | Ht 66.0 in | Wt 138.1 lb

## 2016-08-07 VITALS — BP 133/83 | HR 52 | Temp 97.8°F | Resp 18 | Ht 66.0 in | Wt 138.1 lb

## 2016-08-07 DIAGNOSIS — Z803 Family history of malignant neoplasm of breast: Secondary | ICD-10-CM

## 2016-08-07 DIAGNOSIS — Z08 Encounter for follow-up examination after completed treatment for malignant neoplasm: Secondary | ICD-10-CM | POA: Diagnosis present

## 2016-08-07 DIAGNOSIS — Z853 Personal history of malignant neoplasm of breast: Secondary | ICD-10-CM

## 2016-08-07 DIAGNOSIS — Z8543 Personal history of malignant neoplasm of ovary: Secondary | ICD-10-CM

## 2016-08-07 DIAGNOSIS — Z9889 Other specified postprocedural states: Secondary | ICD-10-CM | POA: Diagnosis not present

## 2016-08-07 DIAGNOSIS — D709 Neutropenia, unspecified: Secondary | ICD-10-CM | POA: Diagnosis not present

## 2016-08-07 DIAGNOSIS — Z90710 Acquired absence of both cervix and uterus: Secondary | ICD-10-CM | POA: Insufficient documentation

## 2016-08-07 DIAGNOSIS — Z8041 Family history of malignant neoplasm of ovary: Secondary | ICD-10-CM

## 2016-08-07 DIAGNOSIS — D3910 Neoplasm of uncertain behavior of unspecified ovary: Secondary | ICD-10-CM

## 2016-08-07 LAB — COMPREHENSIVE METABOLIC PANEL
ALT: 18 U/L (ref 0–55)
AST: 23 U/L (ref 5–34)
Albumin: 4.3 g/dL (ref 3.5–5.0)
Alkaline Phosphatase: 93 U/L (ref 40–150)
Anion Gap: 9 mEq/L (ref 3–11)
BUN: 12.8 mg/dL (ref 7.0–26.0)
CO2: 29 mEq/L (ref 22–29)
Calcium: 10.3 mg/dL (ref 8.4–10.4)
Chloride: 108 mEq/L (ref 98–109)
Creatinine: 0.9 mg/dL (ref 0.6–1.1)
EGFR: 72 mL/min/{1.73_m2} — ABNORMAL LOW (ref >90)
Glucose: 96 mg/dl (ref 70–140)
Potassium: 4.1 mEq/L (ref 3.5–5.1)
Sodium: 145 mEq/L (ref 136–145)
Total Bilirubin: 0.98 mg/dL (ref 0.20–1.20)
Total Protein: 7.7 g/dL (ref 6.4–8.3)

## 2016-08-07 LAB — CBC & DIFF AND RETIC
BASO%: 0.3 % (ref 0.0–2.0)
Basophils Absolute: 0 10*3/uL (ref 0.0–0.1)
EOS%: 1.3 % (ref 0.0–7.0)
Eosinophils Absolute: 0.1 10*3/uL (ref 0.0–0.5)
HCT: 42.2 % (ref 34.8–46.6)
HGB: 14.3 g/dL (ref 11.6–15.9)
Immature Retic Fract: 1.7 % (ref 1.60–10.00)
LYMPH%: 34.6 % (ref 14.0–49.7)
MCH: 30.2 pg (ref 25.1–34.0)
MCHC: 33.9 g/dL (ref 31.5–36.0)
MCV: 89 fL (ref 79.5–101.0)
MONO#: 0.3 10*3/uL (ref 0.1–0.9)
MONO%: 6.6 % (ref 0.0–14.0)
NEUT#: 2.2 10*3/uL (ref 1.5–6.5)
NEUT%: 57.2 % (ref 38.4–76.8)
Platelets: 196 10*3/uL (ref 145–400)
RBC: 4.74 10*6/uL (ref 3.70–5.45)
RDW: 12.9 % (ref 11.2–14.5)
Retic %: 1.17 % (ref 0.70–2.10)
Retic Ct Abs: 55.46 10*3/uL (ref 33.70–90.70)
WBC: 3.8 10*3/uL — ABNORMAL LOW (ref 3.9–10.3)
lymph#: 1.3 10*3/uL (ref 0.9–3.3)

## 2016-08-07 LAB — TSH: TSH: 2.213 m(IU)/L (ref 0.308–3.960)

## 2016-08-07 LAB — LACTATE DEHYDROGENASE: LDH: 215 U/L (ref 125–245)

## 2016-08-07 LAB — CHCC SMEAR

## 2016-08-07 NOTE — Progress Notes (Signed)
Consult Note: Gyn-Onc   Lauren Calhoun 65 y.o. female  No chief complaint on file.   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 Patient's clinically free of disease   Plan: She'll return in 6 months for followup and  we will repeat tumor markers at that time.  CA-125 and CA 27. 29 are obtained today.  Interval History:   Patient returns today as previously scheduled. Since her last visit she's done well. She denies any GI GU or pelvic symptoms.   Her last CA-125 in June was which was 20 units per mL (previously 14) and a CA 2729 of 20 units (previously 19)  It is noted that she apparently has a low white count is undergoing hematology workup at the present time.    HPI:65 year old white married female seen in consultation request of Dr. Edwinna Areola regarding bilateral ovarian neoplasms and an elevated CA 125. On routine laboratory testing a CA 125 was obtained and returned as 229 units per mL. Subsequent CT scan was obtained showing bilateral ovarian masses. The right measured 5.5 x 5.5 x 6.3 cm in the left measured 6.3 x 5.1 x 5.1 cm. There is no evidence of free fluid and only minimal prominence of a lymph node in the right lower quadrantThe patient has a remote history of breast cancer.  Family history may be significant in that her mother died at age 46 of uterine or cervix cancer, a sister at age 61 of breast cancer, and a maternal aunt may have had ovarian cancer. She is menopausal and not taking any hormone replacement therapy.   She underwent exploratory laparotomy on 08/23/2012. Both ovaries were enlarged with surface excrescences. On frozen section the tumors were considered to be borderline tumors. We proceeded with full surgical staging including pelvic and aortic lymphadenectomy, omentectomy, and peritoneal biopsies. Final pathology revealed a low malignant potential tumor with focal areas of microinvasion. All biopsies were negative for metastatic  disease. The patient had an uncomplicated postoperative course. Given the final pathology no adjuvant therapy was recommended.   Review of Systems:10 point review of systems is negative except as noted in interval history.   Vitals: Blood pressure 133/83, pulse (!) 52, temperature 97.8 F (36.6 C), temperature source Oral, resp. rate 18, height 5\' 6"  (1.676 m), weight 138 lb 1.6 oz (62.6 kg), SpO2 100 %.  Physical Exam: General : The patient is a healthy woman in no acute distress.  HEENT: normocephalic, extraoccular movements normal; neck is supple without thyromegally  Lynphnodes: Supraclavicular and inguinal nodes not enlarged  Abdomen: Soft, non-tender, no ascites, no organomegally, no masses, no hernias midline incisions well-healed. Pelvic:  EGBUS: Normal female  Vagina: Normal, no lesions  Urethra and Bladder: Normal, non-tender  Cervix: Surgically absent  Uterus: Surgically absent  Bi-manual examination: Non-tender; no adenxal masses or nodularity  Rectal: normal sphincter tone, no masses, no blood  Lower extremities: No edema or varicosities. Normal range of motion      No Known Allergies  Past Medical History:  Diagnosis Date  . Cancer (HCC)    Breast,sential node  . Chronic kidney disease    kidney stone now ct scan  . Tear of meniscus of knee joint    presently has a tear left knee    Past Surgical History:  Procedure Laterality Date  . ABDOMINAL HYSTERECTOMY  08/23/2012   Procedure: HYSTERECTOMY ABDOMINAL;  Surgeon: Alvino Chapel, MD;  Location: [facility];  Service: Gynecology;  Laterality: N/A;  Perineal biopsies  . APPENDECTOMY  AGE 58  . BREAST LUMPECTOMY  2005   RIGHT  . LYMPHADENECTOMY  08/23/2012   Procedure: LYMPHADENECTOMY;  Surgeon: Alvino Chapel, MD;  Location: [facility];  Service: Gynecology;;  Pelvic and para-aortic  . OMENTECTOMY  08/23/2012   Procedure: OMENTECTOMY;  Surgeon: Alvino Chapel, MD;  Location: [facility];   Service: Gynecology;;  . SALPINGOOPHORECTOMY  08/23/2012   Procedure: SALPINGO OOPHORECTOMY;  Surgeon: Alvino Chapel, MD;  Location: [facility];  Service: Gynecology;  Laterality: Bilateral;    Current Outpatient Prescriptions  Medication Sig Dispense Refill  . calcium-vitamin D (OSCAL WITH D) 250-125 MG-UNIT tablet Take 1 tablet by mouth daily.     No current facility-administered medications for this visit.     Social History   Social History  . Marital status: Married    Spouse name: N/A  . Number of children: N/A  . Years of education: N/A   Occupational History  . Not on file.   Social History Main Topics  . Smoking status: Never Smoker  . Smokeless tobacco: Never Used  . Alcohol use Yes     Comment: RARE MAYBE 2X MONTH  . Drug use: No  . Sexual activity: Yes   Other Topics Concern  . Not on file   Social History Narrative  . No narrative on file    Family History  Problem Relation Age of Onset  . Cancer Mother     UTERINE AGE 34  . Cancer Sister     BREAST AGE 578  . Cancer Maternal Aunt     Ovarian cancer      Lauren Sleigh, MD 08/07/2016, 11:56 AM

## 2016-08-07 NOTE — Patient Instructions (Addendum)
We will call you with lab results. Follow up in 6 months.

## 2016-08-07 NOTE — Progress Notes (Signed)
[facility]    HEMATOLOGY/ONCOLOGY CONSULTATION NOTE  Date of Service: 08/07/2016  Patient Care Team: Elby Showers, MD as PCP - General (Internal Medicine)  CHIEF COMPLAINTS/PURPOSE OF CONSULTATION:  leukopenia  HISTORY OF PRESENTING ILLNESS:   Lauren Calhoun is a wonderful 65 y.o. female who has been referred to [place] by Dr .Elby Showers, MD for evaluation and management of leucopenia/neutropenia.  Patient has a history of stage I HER-2/neu positive breast cancer on the right breast diagnosed in 2003 treated with lumpectomy-> TCH -->RT She notes that she only had additional 10-12 weeks of Herceptin which was discontinued due to concern of cardiomyopathy.  Was managed at the time by Dr Gunnar Bulla Magrinaut.   She notes she was diagnosed with Stage IC Ovarian cancer (low Malignant potential tumor of Ovaries) in 2013 Mx by Dr Aldean Ast, s/p TAH/BSO, omentectomy and 24LN neg. She has previously had genetic testing and notes that she was neg for BRCA 1/2.  She has had some leukopenia 2.9 -3.1k over the last year or so and recent her [place] was down to 1189 on labs with her PCP. She was referred to [place] in view of her previous chemotherapy.  Patient was understand anxious and had several questions which were answered in details. She notes that she feels well. No issues with infections recently or tendency to frequent infections. Currently is not on any significant medications other that calcium-Vit D. She notes that she has been eating well and has no significant dietary restrictions. No weight loss. Has not noted any new breast discomfort/lumps. Notes she is scheduled for her MMG with her PCP in 08/2016.  No abdominal pain/distension.  MEDICAL HISTORY:  Past Medical History:  Diagnosis Date  . Cancer (HCC)    Breast,sential node  . Chronic kidney disease    kidney stone now ct scan  . Tear of meniscus of knee joint    presently has a tear left knee  Osteopenia Stage I Ovarian Cancer 2013 Stage  I Rt breast her2+ cancer in 7322  Lichen Sclerosus area at the introitus - on clobetasol  SURGICAL HISTORY: Past Surgical History:  Procedure Laterality Date  . ABDOMINAL HYSTERECTOMY  08/23/2012   Procedure: HYSTERECTOMY ABDOMINAL;  Surgeon: Alvino Chapel, MD;  Location: [facility];  Service: Gynecology;  Laterality: N/A;  Perineal biopsies  . APPENDECTOMY  AGE 89  . BREAST LUMPECTOMY  2005   RIGHT  . LYMPHADENECTOMY  08/23/2012   Procedure: LYMPHADENECTOMY;  Surgeon: Alvino Chapel, MD;  Location: [facility];  Service: Gynecology;;  Pelvic and para-aortic  . OMENTECTOMY  08/23/2012   Procedure: OMENTECTOMY;  Surgeon: Alvino Chapel, MD;  Location: [facility];  Service: Gynecology;;  . SALPINGOOPHORECTOMY  08/23/2012   Procedure: SALPINGO OOPHORECTOMY;  Surgeon: Alvino Chapel, MD;  Location: [facility];  Service: Gynecology;  Laterality: Bilateral;    SOCIAL HISTORY: Social History   Social History  . Marital status: Married    Spouse name: N/A  . Number of children: N/A  . Years of education: N/A   Occupational History  . Not on file.   Social History Main Topics  . Smoking status: Never Smoker  . Smokeless tobacco: Never Used  . Alcohol use Yes     Comment: RARE MAYBE 2X MONTH  . Drug use: No  . Sexual activity: Yes   Other Topics Concern  . Not on file   Social History Narrative  . 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  FAMILY HISTORY: Family History  Problem Relation Age of Onset  . Cancer Mother     UTERINE AGE 51  . Cancer Sister     BREAST AGE 60  . Cancer Maternal Aunt     Ovarian cancer    ALLERGIES:  has No Known Allergies.  MEDICATIONS:  Current Outpatient Prescriptions  Medication Sig Dispense Refill  . calcium-vitamin D (OSCAL WITH D) 250-125 MG-UNIT tablet Take 1 tablet by mouth daily.     No current facility-administered  medications for this visit.     REVIEW OF SYSTEMS:    10 Point review of Systems was done is negative except as noted above.  PHYSICAL EXAMINATION: ECOG PERFORMANCE STATUS: 0 - Asymptomatic  . Vitals:   08/07/16 0923  BP: 121/84  Pulse: 63  Resp: 16  Temp: 98 F (36.7 C)   Filed Weights   08/07/16 0923  Weight: 138 lb 1.6 oz (62.6 kg)   .Body mass index is 22.29 kg/m.  GENERAL:alert, in no acute distress and comfortable SKIN: skin color, texture, turgor are normal, no rashes or significant lesions EYES: normal, conjunctiva are pink and non-injected, sclera clear OROPHARYNX:no exudate, no erythema and lips, buccal mucosa, and tongue normal  NECK: supple, no JVD, thyroid normal size, non-tender, without nodularity LYMPH:  no palpable lymphadenopathy in the cervical, axillary or inguinal LUNGS: clear to auscultation with normal respiratory effort HEART: regular rate & rhythm,  no murmurs and no lower extremity edema ABDOMEN: abdomen soft, non-tender, normoactive bowel sounds , no palpable hepatosplenomegaly Musculoskeletal: no cyanosis of digits and no clubbing  PSYCH: alert & oriented x 3 with fluent speech NEURO: no focal motor/sensory deficits  LABORATORY DATA:  I have reviewed the data as listed  . CBC Latest Ref Rng & Units 08/07/2016 06/30/2016  WBC 3.9 - 10.3 10e3/uL 3.8(L) 2.9(L)  Hemoglobin 11.6 - 15.9 g/dL 14.3 13.8  Hematocrit 34.8 - 46.6 % 42.2 41.3  Platelets 145 - 400 10e3/uL 196 214   . CMP Latest Ref Rng & Units 08/07/2016 08/07/2016 12/26/2015  Glucose 70 - 140 mg/dl 96 - 83  BUN 7.0 - 26.0 mg/dL 12.8 - 12  Creatinine 0.6 - 1.1 mg/dL 0.9 - 0.97  Sodium 136 - 145 mEq/L 145 - 142  Potassium 3.5 - 5.1 mEq/L 4.1 - 4.6  Chloride 98 - 110 mmol/L - - 106  CO2 22 - 29 mEq/L 29 - 27  Calcium 8.4 - 10.4 mg/dL 10.3 - 10.1  Total Protein 6.0 - 8.5 g/dL 7.1 7.7 6.8  Total Bilirubin 0.20 - 1.20 mg/dL 0.98 - 0.8  Alkaline Phos 40 - 150 U/L 93 - 73  AST 5 - 34  U/L 23 - 20  ALT 0 - 55 U/L 18 - 15   Component     Latest Ref Rng & Units 08/07/2016  WBC     3.9 - 10.3 10e3/uL 3.8 (L)  NEUT#     1.5 - 6.5 10e3/uL 2.2  Hemoglobin     11.6 - 15.9 g/dL 14.3  HCT     34.8 - 46.6 % 42.2  Platelets     145 - 400 10e3/uL 196  MCV     79.5 - 101.0 fL 89.0  MCH     25.1 - 34.0 pg 30.2  MCHC     31.5 - 36.0 g/dL 33.9  RBC     3.70 - 5.45 10e6/uL 4.74  RDW     11.2 - 14.5 % 12.9  lymph#     0.9 - 3.3 10e3/uL 1.3  MONO#     0.1 - 0.9 10e3/uL 0.3  Eosinophils Absolute     0.0 - 0.5 10e3/uL 0.1  Basophils Absolute     0.0 - 0.1 10e3/uL 0.0  NEUT%     38.4 - 76.8 % 57.2  LYMPH%     14.0 - 49.7 % 34.6  MONO%     0.0 - 14.0 % 6.6  EOS%     0.0 - 7.0 % 1.3  BASO%     0.0 - 2.0 % 0.3  Retic %     0.70 - 2.10 % 1.17  Retic Ct Abs     33.70 - 90.70 10e3/uL 55.46  Immature Retic Fract     1.60 - 10.00 % 1.70  Sodium     136 - 145 mEq/L 145  Potassium     3.5 - 5.1 mEq/L 4.1  Chloride     98 - 109 mEq/L 108  CO2     22 - 29 mEq/L 29  Glucose     70 - 140 mg/dl 96  BUN     7.0 - 26.0 mg/dL 12.8  Creatinine     0.6 - 1.1 mg/dL 0.9  Total Bilirubin     0.20 - 1.20 mg/dL 0.98  Alkaline Phosphatase     40 - 150 U/L 93  AST     5 - 34 U/L 23  ALT     0 - 55 U/L 18  Total Protein     6.4 - 8.3 g/dL 7.7  Albumin     3.5 - 5.0 g/dL 4.3  Calcium     8.4 - 10.4 mg/dL 10.3  Anion gap     3 - 11 mEq/L 9  EGFR     >90 ml/min/1.73 m2 72 (L)  IgG (Immunoglobin G), Serum     700 - 1,600 mg/dL 953  IgA/Immunoglobulin A, Serum     87 - 352 mg/dL 178  IgM, Qn, Serum     26 - 217 mg/dL 61  Total Protein     6.0 - 8.5 g/dL 7.1  Albumin SerPl Elph-Mcnc     2.9 - 4.4 g/dL 4.6 (H)  Alpha 1     0.0 - 0.4 g/dL 0.2  Alpha2 Glob SerPl Elph-Mcnc     0.4 - 1.0 g/dL 0.5  B-Globulin SerPl Elph-Mcnc     0.7 - 1.3 g/dL 1.0  Gamma Glob SerPl Elph-Mcnc     0.4 - 1.8 g/dL 0.8  M Protein SerPl Elph-Mcnc     Not Observed g/dL Not Observed    Globulin, Total     2.2 - 3.9 g/dL 2.5  Albumin/Glob SerPl     0.7 - 1.7 1.9 (H)  IFE 1      Comment  Please Note (HCV):      Comment  LDH     125 - 245 U/L 215  Sed Rate     0 - 40 mm/hr 2  Vitamin B12     211 - 946 pg/mL 507  Copper     72 - 166 ug/dL 90  TSH     0.308 - 3.960 m(IU)/L 2.213    RADIOGRAPHIC STUDIES: I have personally reviewed the radiological images as listed and agreed with the findings in the report. No results found.  ASSESSMENT & PLAN:   65 yo with  1) Mild Isolated neutropenia ANC 1189 with leucopenia of 2.9k Concern to r/o MDS in setting of previous chemotherapy. Rpt labs today show resolution of neutropenia with ANC of 2.2k and WBC count of 3.8k No associated anemia or thrombocytopenia PBS reviewed - no overt features of MDS, no blasts noted. Normal myeloid maturation.  Workup was ordered including copper, B12 levels, Myeloma panel - WNL LDH wnl and she has no features of lymphoproliferative process. No fevers/chills or other symptoms to suggest infection. Plan -discussed with patient that isolated mild neutropenia could likely have benign etiology - viral infection/autoimmune/medication. -labs orders and reviewed as noted above -normalization of neutrophil counts are reassuring. -no indication for additional workup with a bone marrow biopsy at this time. -we shall f/u rpt cbc with diff in 2-3 months to monitor her counts  2) Stage IC OVarian tumor of low malignant potential  -continue f/u with Dr Dalbert Garnet  3) Remote h/o Stage I rt breast Her2+ cancer s/p tx as noted above -continue year MMG with PCP - has a MMG  Due in 08/2016.   Return to clinic with Dr. Irene Limbo in 3 months with repeat labs  All of the patients questions were answered with apparent satisfaction. The patient knows to call the clinic with any problems, questions or concerns.  I spent 45 minutes counseling the patient face to face. The total time spent in the  appointment was 60 minutes and more than 50% was on counseling and direct patient cares.    Sullivan Lone MD [place] AAHIVMS [facility] Hematology/Oncology Physician [facility]  (Office):       [phone] (Work cell):  [phone] (Fax):           [phone]  08/07/2016 10:21 AM

## 2016-08-08 LAB — SEDIMENTATION RATE: Sedimentation Rate-Westergren: 2 mm/hr (ref 0–40)

## 2016-08-08 LAB — VITAMIN B12: Vitamin B12: 507 pg/mL (ref 211–946)

## 2016-08-09 LAB — COPPER, SERUM: Copper: 90 ug/dL (ref 72–166)

## 2016-08-11 LAB — MULTIPLE MYELOMA PANEL, SERUM
Albumin SerPl Elph-Mcnc: 4.6 g/dL — ABNORMAL HIGH (ref 2.9–4.4)
Albumin/Glob SerPl: 1.9 — ABNORMAL HIGH (ref 0.7–1.7)
Alpha 1: 0.2 g/dL (ref 0.0–0.4)
Alpha2 Glob SerPl Elph-Mcnc: 0.5 g/dL (ref 0.4–1.0)
B-Globulin SerPl Elph-Mcnc: 1 g/dL (ref 0.7–1.3)
Gamma Glob SerPl Elph-Mcnc: 0.8 g/dL (ref 0.4–1.8)
Globulin, Total: 2.5 g/dL (ref 2.2–3.9)
IgA, Qn, Serum: 178 mg/dL (ref 87–352)
IgG, Qn, Serum: 953 mg/dL (ref 700–1600)
IgM, Qn, Serum: 61 mg/dL (ref 26–217)
Total Protein: 7.1 g/dL (ref 6.0–8.5)

## 2016-08-19 ENCOUNTER — Telehealth: Payer: Self-pay | Admitting: General Practice

## 2016-08-19 NOTE — Telephone Encounter (Signed)
Left msg regarding Feb 2018 appt.

## 2016-10-11 ENCOUNTER — Telehealth: Payer: Self-pay | Admitting: Hematology

## 2016-10-11 NOTE — Telephone Encounter (Signed)
Called pt to r/s Feb 2018 md appt per inbox. Reached pt's vm. Lvm asking her to call us to r/s.

## 2016-10-15 ENCOUNTER — Telehealth: Payer: Self-pay | Admitting: Hematology

## 2016-10-15 NOTE — Telephone Encounter (Signed)
Appointments rescheduled per scheduling message. Patient notified.

## 2016-11-11 ENCOUNTER — Other Ambulatory Visit: Payer: BLUE CROSS/BLUE SHIELD

## 2016-11-11 ENCOUNTER — Ambulatory Visit: Payer: BLUE CROSS/BLUE SHIELD | Admitting: Hematology

## 2016-11-19 ENCOUNTER — Ambulatory Visit (HOSPITAL_BASED_OUTPATIENT_CLINIC_OR_DEPARTMENT_OTHER): Payer: BLUE CROSS/BLUE SHIELD | Admitting: Hematology

## 2016-11-19 ENCOUNTER — Other Ambulatory Visit (HOSPITAL_BASED_OUTPATIENT_CLINIC_OR_DEPARTMENT_OTHER): Payer: BLUE CROSS/BLUE SHIELD

## 2016-11-19 ENCOUNTER — Encounter: Payer: Self-pay | Admitting: Hematology

## 2016-11-19 VITALS — BP 114/71 | HR 55 | Temp 98.2°F | Resp 14 | Wt 139.7 lb

## 2016-11-19 DIAGNOSIS — D3910 Neoplasm of uncertain behavior of unspecified ovary: Secondary | ICD-10-CM | POA: Diagnosis not present

## 2016-11-19 DIAGNOSIS — D709 Neutropenia, unspecified: Secondary | ICD-10-CM

## 2016-11-19 DIAGNOSIS — Z853 Personal history of malignant neoplasm of breast: Secondary | ICD-10-CM

## 2016-11-19 DIAGNOSIS — Z8742 Personal history of other diseases of the female genital tract: Secondary | ICD-10-CM | POA: Diagnosis not present

## 2016-11-19 LAB — CBC & DIFF AND RETIC
BASO%: 0.2 % (ref 0.0–2.0)
Basophils Absolute: 0 10*3/uL (ref 0.0–0.1)
EOS%: 1.7 % (ref 0.0–7.0)
Eosinophils Absolute: 0.1 10*3/uL (ref 0.0–0.5)
HCT: 41 % (ref 34.8–46.6)
HGB: 13.7 g/dL (ref 11.6–15.9)
Immature Retic Fract: 3.6 % (ref 1.60–10.00)
LYMPH%: 33.4 % (ref 14.0–49.7)
MCH: 30 pg (ref 25.1–34.0)
MCHC: 33.4 g/dL (ref 31.5–36.0)
MCV: 89.7 fL (ref 79.5–101.0)
MONO#: 0.3 10*3/uL (ref 0.1–0.9)
MONO%: 7.2 % (ref 0.0–14.0)
NEUT#: 2.4 10*3/uL (ref 1.5–6.5)
NEUT%: 57.5 % (ref 38.4–76.8)
Platelets: 176 10*3/uL (ref 145–400)
RBC: 4.57 10*6/uL (ref 3.70–5.45)
RDW: 13.1 % (ref 11.2–14.5)
Retic %: 1.26 % (ref 0.70–2.10)
Retic Ct Abs: 57.58 10*3/uL (ref 33.70–90.70)
WBC: 4.2 10*3/uL (ref 3.9–10.3)
lymph#: 1.4 10*3/uL (ref 0.9–3.3)

## 2016-11-20 LAB — CA 125: Cancer Antigen (CA) 125: 13.7 U/mL (ref 0.0–38.1)

## 2016-11-20 LAB — CANCER ANTIGEN 27.29: CA 27.29: 19.9 U/mL (ref 0.0–38.6)

## 2016-11-22 NOTE — Progress Notes (Signed)
Marland Kitchen    HEMATOLOGY/ONCOLOGY CLINIC NOTE  Date of Service: .11/19/2016   Patient Care Team: Elby Showers, MD as PCP - General (Internal Medicine)  CHIEF COMPLAINTS/PURPOSE OF CONSULTATION:  leukopenia  HISTORY OF PRESENTING ILLNESS:   Lauren Calhoun is a wonderful 66 y.o. female who has been referred to [place] by Dr .Elby Showers, MD for evaluation and management of leucopenia/neutropenia.  Patient has a history of stage I HER-2/neu positive breast cancer on the right breast diagnosed in 2003 treated with lumpectomy-> TCH -->RT She notes that she only had additional 10-12 weeks of Herceptin which was discontinued due to concern of cardiomyopathy.  Was managed at the time by Dr Gunnar Bulla Magrinaut.   She notes she was diagnosed with Stage IC Ovarian cancer (low Malignant potential tumor of Ovaries) in 2013 Mx by Dr Aldean Ast, s/p TAH/BSO, omentectomy and 24LN neg. She has previously had genetic testing and notes that she was neg for BRCA 1/2.  She has had some leukopenia 2.9 -3.1k over the last year or so and recent her [place] was down to 1189 on labs with her PCP. She was referred to [place] in view of her previous chemotherapy.  Patient was understand anxious and had several questions which were answered in details. She notes that she feels well. No issues with infections recently or tendency to frequent infections. Currently is not on any significant medications other that calcium-Vit D. She notes that she has been eating well and has no significant dietary restrictions. No weight loss. Has not noted any new breast discomfort/lumps. Notes she is scheduled for her MMG with her PCP in 08/2016.  No abdominal pain/distension.  INTERVAL HISTORY  Lauren Calhoun is here for her scheduled 3 month f/u for neutropenia. Her rpt labs today are stable with no leucopenia or neutropenia. She is very relieved by this. Has no acute new concerns. Has been keeping busy.  MEDICAL HISTORY:  Past Medical History:    Diagnosis Date  . Cancer (HCC)    Breast,sential node  . Chronic kidney disease    kidney stone now ct scan  . Tear of meniscus of knee joint    presently has a tear left knee  Osteopenia Stage I Ovarian Cancer 2013 Stage I Rt breast her2+ cancer in 6073  Lichen Sclerosus area at the introitus - on clobetasol  SURGICAL HISTORY: Past Surgical History:  Procedure Laterality Date  . ABDOMINAL HYSTERECTOMY  08/23/2012   Procedure: HYSTERECTOMY ABDOMINAL;  Surgeon: Alvino Chapel, MD;  Location: [facility];  Service: Gynecology;  Laterality: N/A;  Perineal biopsies  . APPENDECTOMY  AGE 67  . BREAST LUMPECTOMY  2005   RIGHT  . LYMPHADENECTOMY  08/23/2012   Procedure: LYMPHADENECTOMY;  Surgeon: Alvino Chapel, MD;  Location: [facility];  Service: Gynecology;;  Pelvic and para-aortic  . OMENTECTOMY  08/23/2012   Procedure: OMENTECTOMY;  Surgeon: Alvino Chapel, MD;  Location: [facility];  Service: Gynecology;;  . SALPINGOOPHORECTOMY  08/23/2012   Procedure: SALPINGO OOPHORECTOMY;  Surgeon: Alvino Chapel, MD;  Location: [facility];  Service: Gynecology;  Laterality: Bilateral;    SOCIAL HISTORY: Social History   Social History  . Marital status: Married    Spouse name: N/A  . Number of children: N/A  . Years of education: N/A   Occupational History  . Not on file.   Social History Main Topics  . Smoking status: Never Smoker  . Smokeless tobacco: Never Used  . Alcohol use Yes  Comment: RARE MAYBE 2X MONTH  . Drug use: No  . Sexual activity: Yes   Other Topics Concern  . Not on file   Social History Narrative  . 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  FAMILY HISTORY: Family History  Problem Relation Age of Onset  . Cancer Mother     UTERINE AGE 56  . Cancer Sister     BREAST AGE 19  . Cancer Maternal Aunt     Ovarian cancer    ALLERGIES:  has No Known  Allergies.  MEDICATIONS:  Current Outpatient Prescriptions  Medication Sig Dispense Refill  . Multiple Vitamins-Minerals (MULTIVITAMIN ADULTS PO) Take by mouth.     No current facility-administered medications for this visit.     REVIEW OF SYSTEMS:    10 Point review of Systems was done is negative except as noted above.  PHYSICAL EXAMINATION: ECOG PERFORMANCE STATUS: 0 - Asymptomatic  . Vitals:   11/19/16 1334  BP: 114/71  Pulse: (!) 55  Resp: 14  Temp: 98.2 F (36.8 C)   Filed Weights   11/19/16 1334  Weight: 139 lb 11.2 oz (63.4 kg)   .Body mass index is 22.55 kg/m.  GENERAL:alert, in no acute distress and comfortable SKIN: skin color, texture, turgor are normal, no rashes or significant lesions EYES: normal, conjunctiva are pink and non-injected, sclera clear OROPHARYNX:no exudate, no erythema and lips, buccal mucosa, and tongue normal  NECK: supple, no JVD, thyroid normal size, non-tender, without nodularity LYMPH:  no palpable lymphadenopathy in the cervical, axillary or inguinal LUNGS: clear to auscultation with normal respiratory effort HEART: regular rate & rhythm,  no murmurs and no lower extremity edema ABDOMEN: abdomen soft, non-tender, normoactive bowel sounds , no palpable hepatosplenomegaly Musculoskeletal: no cyanosis of digits and no clubbing  PSYCH: alert & oriented x 3 with fluent speech NEURO: no focal motor/sensory deficits  LABORATORY DATA:  I have reviewed the data as listed  . CBC Latest Ref Rng & Units 11/19/2016 08/07/2016 06/30/2016  WBC 3.9 - 10.3 10e3/uL 4.2 3.8(L) 2.9(L)  Hemoglobin 11.6 - 15.9 g/dL 13.7 14.3 13.8  Hematocrit 34.8 - 46.6 % 41.0 42.2 41.3  Platelets 145 - 400 10e3/uL 176 196 214    CBC    Component Value Date/Time   WBC 4.2 11/19/2016 1301   WBC 2.9 (L) 06/30/2016 1040   RBC 4.57 11/19/2016 1301   RBC 4.62 06/30/2016 1040   HGB 13.7 11/19/2016 1301   HCT 41.0 11/19/2016 1301   PLT 176 11/19/2016 1301   MCV  89.7 11/19/2016 1301   MCH 30.0 11/19/2016 1301   MCH 29.9 06/30/2016 1040   MCHC 33.4 11/19/2016 1301   MCHC 33.4 06/30/2016 1040   RDW 13.1 11/19/2016 1301   LYMPHSABS 1.4 11/19/2016 1301   MONOABS 0.3 11/19/2016 1301   EOSABS 0.1 11/19/2016 1301   BASOSABS 0.0 11/19/2016 1301   . CMP Latest Ref Rng & Units 08/07/2016 08/07/2016 12/26/2015  Glucose 70 - 140 mg/dl 96 - 83  BUN 7.0 - 26.0 mg/dL 12.8 - 12  Creatinine 0.6 - 1.1 mg/dL 0.9 - 0.97  Sodium 136 - 145 mEq/L 145 - 142  Potassium 3.5 - 5.1 mEq/L 4.1 - 4.6  Chloride 98 - 110 mmol/L - - 106  CO2 22 - 29 mEq/L 29 - 27  Calcium 8.4 - 10.4 mg/dL 10.3 - 10.1  Total Protein 6.0 - 8.5 g/dL 7.1  7.7 6.8  Total Bilirubin 0.20 - 1.20 mg/dL 0.98 - 0.8  Alkaline Phos 40 - 150 U/L 93 - 73  AST 5 - 34 U/L 23 - 20  ALT 0 - 55 U/L 18 - 15    Component     Latest Ref Rng & Units 08/07/2016  WBC     3.9 - 10.3 10e3/uL 3.8 (L)  NEUT#     1.5 - 6.5 10e3/uL 2.2  Hemoglobin     11.6 - 15.9 g/dL 14.3  HCT     34.8 - 46.6 % 42.2  Platelets     145 - 400 10e3/uL 196  MCV     79.5 - 101.0 fL 89.0  MCH     25.1 - 34.0 pg 30.2  MCHC     31.5 - 36.0 g/dL 33.9  RBC     3.70 - 5.45 10e6/uL 4.74  RDW     11.2 - 14.5 % 12.9  lymph#     0.9 - 3.3 10e3/uL 1.3  MONO#     0.1 - 0.9 10e3/uL 0.3  Eosinophils Absolute     0.0 - 0.5 10e3/uL 0.1  Basophils Absolute     0.0 - 0.1 10e3/uL 0.0  NEUT%     38.4 - 76.8 % 57.2  LYMPH%     14.0 - 49.7 % 34.6  MONO%     0.0 - 14.0 % 6.6  EOS%     0.0 - 7.0 % 1.3  BASO%     0.0 - 2.0 % 0.3  Retic %     0.70 - 2.10 % 1.17  Retic Ct Abs     33.70 - 90.70 10e3/uL 55.46  Immature Retic Fract     1.60 - 10.00 % 1.70  Sodium     136 - 145 mEq/L 145  Potassium     3.5 - 5.1 mEq/L 4.1  Chloride     98 - 109 mEq/L 108  CO2     22 - 29 mEq/L 29  Glucose     70 - 140 mg/dl 96  BUN     7.0 - 26.0 mg/dL 12.8  Creatinine     0.6 - 1.1 mg/dL 0.9  Total Bilirubin     0.20 - 1.20 mg/dL 0.98    Alkaline Phosphatase     40 - 150 U/L 93  AST     5 - 34 U/L 23  ALT     0 - 55 U/L 18  Total Protein     6.4 - 8.3 g/dL 7.7  Albumin     3.5 - 5.0 g/dL 4.3  Calcium     8.4 - 10.4 mg/dL 10.3  Anion gap     3 - 11 mEq/L 9  EGFR     >90 ml/min/1.73 m2 72 (L)  IgG (Immunoglobin G), Serum     700 - 1,600 mg/dL 953  IgA/Immunoglobulin A, Serum     87 - 352 mg/dL 178  IgM, Qn, Serum     26 - 217 mg/dL 61  Total Protein     6.0 - 8.5 g/dL 7.1  Albumin SerPl Elph-Mcnc     2.9 - 4.4 g/dL 4.6 (H)  Alpha 1     0.0 - 0.4 g/dL 0.2  Alpha2 Glob SerPl Elph-Mcnc     0.4 - 1.0 g/dL 0.5  B-Globulin SerPl Elph-Mcnc     0.7 - 1.3 g/dL 1.0  Gamma Glob SerPl Elph-Mcnc  0.4 - 1.8 g/dL 0.8  M Protein SerPl Elph-Mcnc     Not Observed g/dL Not Observed  Globulin, Total     2.2 - 3.9 g/dL 2.5  Albumin/Glob SerPl     0.7 - 1.7 1.9 (H)  IFE 1      Comment  Please Note (HCV):      Comment  LDH     125 - 245 U/L 215  Sed Rate     0 - 40 mm/hr 2  Vitamin B12     211 - 946 pg/mL 507  Copper     72 - 166 ug/dL 90  TSH     0.308 - 3.960 m(IU)/L 2.213    RADIOGRAPHIC STUDIES: I have personally reviewed the radiological images as listed and agreed with the findings in the report. No results found.  ASSESSMENT & PLAN:   66 yo with   1) Mild Isolated neutropenia ANC 1189 with leucopenia of 2.9k (06/30/2016) Concern to r/o MDS in setting of previous chemotherapy. Rpt labs today show resolution of neutropenia with ANC of 2.2k and WBC count of 3.8k No associated anemia or thrombocytopenia PBS reviewed - no overt features of MDS, no blasts noted. Normal myeloid maturation.  Workup was ordered including copper, B12 levels, Myeloma panel - WNL LDH wnl and she has no features of lymphoproliferative process. No fevers/chills or other symptoms to suggest infection.  Patient on 41monthfollowup today remains asymptomatic and her cbc shows resolution of leucopenia and  neutropenia. Plan -discussed lab results with patient - she is very relieved as is understandable. -no indication for additional workup with a bone marrow biopsy at this time. -continue f/u with PCP.  2) Stage IC OVarian tumor of low malignant potential  -continue f/u with Dr CDalbert Garnet3) Remote h/o Stage I rt breast Her2+ cancer s/p tx as noted above -continue year MMG with PCP - has a MMG  Due in 08/2016. Component     Latest Ref Rng & Units 11/19/2016  Cancer Antigen (CA) 125     0.0 - 38.1 U/mL 13.7  CA 27.29     0.0 - 38.6 U/mL 19.9  -negative tumor markers for Ovarian CA and breast  Continue f/u with PCP  All of the patients questions were answered with apparent satisfaction. The patient knows to call the clinic with any problems, questions or concerns.  I spent 15 minutes counseling the patient face to face. The total time spent in the appointment was 20 minutes and more than 50% was on counseling and direct patient cares.    GSullivan LoneMD MKokhanokAAHIVMS SShadow Mountain Behavioral Health SystemCSanta Clarita Surgery Center LPHematology/Oncology Physician CSt. Elizabeth Grant (Office):       [phone](Work cell):  [phone](Fax):           [phone] 11/22/2016 11:42 PM

## 2016-11-23 ENCOUNTER — Telehealth: Payer: Self-pay | Admitting: *Deleted

## 2016-11-23 NOTE — Telephone Encounter (Signed)
Called pt to inform her of lab results from 2/15.  Pt verbalized understanding and thankful for call.

## 2016-12-28 ENCOUNTER — Other Ambulatory Visit: Payer: BLUE CROSS/BLUE SHIELD | Admitting: Internal Medicine

## 2016-12-28 DIAGNOSIS — Z0000 Encounter for general adult medical examination without abnormal findings: Secondary | ICD-10-CM

## 2016-12-28 DIAGNOSIS — Z1321 Encounter for screening for nutritional disorder: Secondary | ICD-10-CM

## 2016-12-28 DIAGNOSIS — Z13220 Encounter for screening for lipoid disorders: Secondary | ICD-10-CM

## 2016-12-28 DIAGNOSIS — Z1329 Encounter for screening for other suspected endocrine disorder: Secondary | ICD-10-CM

## 2016-12-28 LAB — COMPREHENSIVE METABOLIC PANEL
ALT: 11 U/L (ref 6–29)
AST: 18 U/L (ref 10–35)
Albumin: 4.1 g/dL (ref 3.6–5.1)
Alkaline Phosphatase: 84 U/L (ref 33–130)
BUN: 11 mg/dL (ref 7–25)
CO2: 26 mmol/L (ref 20–31)
Calcium: 10.3 mg/dL (ref 8.6–10.4)
Chloride: 108 mmol/L (ref 98–110)
Creat: 0.97 mg/dL (ref 0.50–0.99)
Glucose, Bld: 85 mg/dL (ref 65–99)
Potassium: 5.1 mmol/L (ref 3.5–5.3)
Sodium: 143 mmol/L (ref 135–146)
Total Bilirubin: 1 mg/dL (ref 0.2–1.2)
Total Protein: 6.7 g/dL (ref 6.1–8.1)

## 2016-12-28 LAB — CBC WITH DIFFERENTIAL/PLATELET
Basophils Absolute: 0 cells/uL (ref 0–200)
Basophils Relative: 0 %
Eosinophils Absolute: 72 cells/uL (ref 15–500)
Eosinophils Relative: 2 %
HCT: 45.3 % — ABNORMAL HIGH (ref 35.0–45.0)
Hemoglobin: 15.1 g/dL (ref 11.7–15.5)
Lymphocytes Relative: 36 %
Lymphs Abs: 1296 cells/uL (ref 850–3900)
MCH: 29.7 pg (ref 27.0–33.0)
MCHC: 33.3 g/dL (ref 32.0–36.0)
MCV: 89.2 fL (ref 80.0–100.0)
MPV: 9.9 fL (ref 7.5–12.5)
Monocytes Absolute: 216 cells/uL (ref 200–950)
Monocytes Relative: 6 %
Neutro Abs: 2016 cells/uL (ref 1500–7800)
Neutrophils Relative %: 56 %
Platelets: 206 10*3/uL (ref 140–400)
RBC: 5.08 MIL/uL (ref 3.80–5.10)
RDW: 14 % (ref 11.0–15.0)
WBC: 3.6 10*3/uL — ABNORMAL LOW (ref 3.8–10.8)

## 2016-12-28 LAB — LIPID PANEL
Cholesterol: 204 mg/dL — ABNORMAL HIGH (ref <200)
HDL: 65 mg/dL (ref >50)
LDL Cholesterol: 124 mg/dL — ABNORMAL HIGH (ref <100)
Total CHOL/HDL Ratio: 3.1 Ratio (ref <5.0)
Triglycerides: 77 mg/dL (ref <150)
VLDL: 15 mg/dL (ref <30)

## 2016-12-28 LAB — TSH: TSH: 1.45 mIU/L

## 2016-12-29 LAB — VITAMIN D 25 HYDROXY (VIT D DEFICIENCY, FRACTURES): Vit D, 25-Hydroxy: 31 ng/mL (ref 30–100)

## 2017-01-01 ENCOUNTER — Other Ambulatory Visit: Payer: BLUE CROSS/BLUE SHIELD

## 2017-01-01 ENCOUNTER — Ambulatory Visit: Payer: Self-pay | Admitting: Hematology

## 2017-01-04 ENCOUNTER — Encounter: Payer: BLUE CROSS/BLUE SHIELD | Admitting: Internal Medicine

## 2017-02-05 ENCOUNTER — Ambulatory Visit: Payer: BLUE CROSS/BLUE SHIELD | Admitting: Gynecology

## 2017-02-25 ENCOUNTER — Encounter: Payer: BLUE CROSS/BLUE SHIELD | Admitting: Internal Medicine

## 2017-02-26 ENCOUNTER — Other Ambulatory Visit: Payer: Self-pay | Admitting: Gynecologic Oncology

## 2017-02-26 DIAGNOSIS — C50919 Malignant neoplasm of unspecified site of unspecified female breast: Secondary | ICD-10-CM

## 2017-02-26 DIAGNOSIS — D3910 Neoplasm of uncertain behavior of unspecified ovary: Secondary | ICD-10-CM

## 2017-03-04 ENCOUNTER — Ambulatory Visit (INDEPENDENT_AMBULATORY_CARE_PROVIDER_SITE_OTHER): Payer: Medicare Other | Admitting: Internal Medicine

## 2017-03-04 ENCOUNTER — Encounter: Payer: Self-pay | Admitting: Internal Medicine

## 2017-03-04 ENCOUNTER — Other Ambulatory Visit (HOSPITAL_BASED_OUTPATIENT_CLINIC_OR_DEPARTMENT_OTHER): Payer: Medicare Other

## 2017-03-04 VITALS — BP 100/60 | HR 64 | Temp 98.2°F | Ht 65.5 in | Wt 139.0 lb

## 2017-03-04 DIAGNOSIS — C50919 Malignant neoplasm of unspecified site of unspecified female breast: Secondary | ICD-10-CM | POA: Diagnosis not present

## 2017-03-04 DIAGNOSIS — D3910 Neoplasm of uncertain behavior of unspecified ovary: Secondary | ICD-10-CM

## 2017-03-04 DIAGNOSIS — D72819 Decreased white blood cell count, unspecified: Secondary | ICD-10-CM

## 2017-03-04 DIAGNOSIS — Z853 Personal history of malignant neoplasm of breast: Secondary | ICD-10-CM | POA: Diagnosis not present

## 2017-03-04 DIAGNOSIS — H6120 Impacted cerumen, unspecified ear: Secondary | ICD-10-CM

## 2017-03-04 DIAGNOSIS — Z0000 Encounter for general adult medical examination without abnormal findings: Secondary | ICD-10-CM | POA: Diagnosis not present

## 2017-03-04 DIAGNOSIS — M8580 Other specified disorders of bone density and structure, unspecified site: Secondary | ICD-10-CM | POA: Diagnosis not present

## 2017-03-04 LAB — CBC WITH DIFFERENTIAL/PLATELET
Basophils Absolute: 39 cells/uL (ref 0–200)
Basophils Relative: 1 %
Eosinophils Absolute: 78 cells/uL (ref 15–500)
Eosinophils Relative: 2 %
HCT: 41.7 % (ref 35.0–45.0)
Hemoglobin: 13.8 g/dL (ref 11.7–15.5)
Lymphocytes Relative: 35 %
Lymphs Abs: 1365 cells/uL (ref 850–3900)
MCH: 29.6 pg (ref 27.0–33.0)
MCHC: 33.1 g/dL (ref 32.0–36.0)
MCV: 89.5 fL (ref 80.0–100.0)
MPV: 9.9 fL (ref 7.5–12.5)
Monocytes Absolute: 312 cells/uL (ref 200–950)
Monocytes Relative: 8 %
Neutro Abs: 2106 cells/uL (ref 1500–7800)
Neutrophils Relative %: 54 %
Platelets: 222 10*3/uL (ref 140–400)
RBC: 4.66 MIL/uL (ref 3.80–5.10)
RDW: 13.8 % (ref 11.0–15.0)
WBC: 3.9 10*3/uL (ref 3.8–10.8)

## 2017-03-04 LAB — POCT URINALYSIS DIPSTICK
Bilirubin, UA: NEGATIVE
Blood, UA: NEGATIVE
Glucose, UA: NEGATIVE
Ketones, UA: NEGATIVE
Leukocytes, UA: NEGATIVE
Nitrite, UA: NEGATIVE
Protein, UA: NEGATIVE
Spec Grav, UA: 1.01 (ref 1.010–1.025)
Urobilinogen, UA: 0.2 E.U./dL
pH, UA: 7.5 (ref 5.0–8.0)
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
## 2017-03-04 NOTE — Patient Instructions (Addendum)
Patient has appointment with Dr. Janace Hoard at [facility] on Thursday, 6/28 @ 11:30.    Directions provided to patient:  [address], [address].  [place] Dx:  Impacted Cerumen Removal  Patient instructed to call Dr. Janace Hoard' office if she needs to cancel or reschedule for any reason.  To see ENT regarding impacted cerumen. CBC with differential repeated today and expect we will follow up with this 6 months. Bone density study Q2 years

## 2017-03-05 ENCOUNTER — Encounter: Payer: Self-pay | Admitting: Gynecology

## 2017-03-05 ENCOUNTER — Ambulatory Visit: Payer: Medicare Other | Attending: Gynecology | Admitting: Gynecology

## 2017-03-05 VITALS — BP 115/68 | HR 52 | Temp 97.9°F | Resp 18 | Wt 138.6 lb

## 2017-03-05 DIAGNOSIS — Z90710 Acquired absence of both cervix and uterus: Secondary | ICD-10-CM | POA: Diagnosis not present

## 2017-03-05 DIAGNOSIS — Z8603 Personal history of neoplasm of uncertain behavior: Secondary | ICD-10-CM

## 2017-03-05 DIAGNOSIS — Z853 Personal history of malignant neoplasm of breast: Secondary | ICD-10-CM | POA: Diagnosis not present

## 2017-03-05 DIAGNOSIS — Z87442 Personal history of urinary calculi: Secondary | ICD-10-CM | POA: Insufficient documentation

## 2017-03-05 DIAGNOSIS — N189 Chronic kidney disease, unspecified: Secondary | ICD-10-CM | POA: Insufficient documentation

## 2017-03-05 DIAGNOSIS — C50919 Malignant neoplasm of unspecified site of unspecified female breast: Secondary | ICD-10-CM

## 2017-03-05 DIAGNOSIS — C569 Malignant neoplasm of unspecified ovary: Secondary | ICD-10-CM | POA: Diagnosis not present

## 2017-03-05 DIAGNOSIS — Z90722 Acquired absence of ovaries, bilateral: Secondary | ICD-10-CM | POA: Insufficient documentation

## 2017-03-05 DIAGNOSIS — Z8049 Family history of malignant neoplasm of other genital organs: Secondary | ICD-10-CM | POA: Insufficient documentation

## 2017-03-05 DIAGNOSIS — Z8041 Family history of malignant neoplasm of ovary: Secondary | ICD-10-CM | POA: Diagnosis not present

## 2017-03-05 DIAGNOSIS — Z9889 Other specified postprocedural states: Secondary | ICD-10-CM | POA: Insufficient documentation

## 2017-03-05 DIAGNOSIS — Z803 Family history of malignant neoplasm of breast: Secondary | ICD-10-CM | POA: Insufficient documentation

## 2017-03-05 DIAGNOSIS — D3910 Neoplasm of uncertain behavior of unspecified ovary: Secondary | ICD-10-CM

## 2017-03-05 LAB — CA 125: Cancer Antigen (CA) 125: 15.4 U/mL (ref 0.0–38.1)

## 2017-03-05 LAB — PATHOLOGIST SMEAR REVIEW

## 2017-03-05 LAB — CANCER ANTIGEN 27.29: CA 27.29: 19.6 U/mL (ref 0.0–38.6)

## 2017-03-05 NOTE — Progress Notes (Signed)
Consult Note: Gyn-Onc   [name] 66 y.o. female  Chief Complaint  Patient presents with  . 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 Patient's clinically free of disease   Plan: She'll return in 6 months for followup and  we will repeat tumor markers at that time.  CA-125 and CA 27. 29 are obtained today.  Interval History:   Patient returns today as previously scheduled. Since her last visit she's done well. She denies any GI GU or pelvic symptoms.   Her last CA-125  was15.4 units per mL and a CA 2729 was 19.6 units per mL.    HPI:66 year old white married female seen in consultation request of Dr. Edwinna Areola regarding bilateral ovarian neoplasms and an elevated CA 125. On routine laboratory testing a CA 125 was obtained and returned as 229 units per mL. Subsequent CT scan was obtained showing bilateral ovarian masses. The right measured 5.5 x 5.5 x 6.3 cm in the left measured 6.3 x 5.1 x 5.1 cm. There is no evidence of free fluid and only minimal prominence of a lymph node in the right lower quadrantThe patient has a remote history of breast cancer.  Family history may be significant in that her mother died at age 13 of uterine or cervix cancer, a sister at age 73 of breast cancer, and a maternal aunt may have had ovarian cancer. She is menopausal and not taking any hormone replacement therapy.   She underwent exploratory laparotomy on 08/23/2012. Both ovaries were enlarged with surface excrescences. On frozen section the tumors were considered to be borderline tumors. We proceeded with full surgical staging including pelvic and aortic lymphadenectomy, omentectomy, and peritoneal biopsies. Final pathology revealed a low malignant potential tumor with focal areas of microinvasion. All biopsies were negative for metastatic disease. The patient had an uncomplicated postoperative course. Given the final pathology no adjuvant therapy was  recommended.   Review of Systems:10 point review of systems is negative except as noted in interval history.   Vitals: Blood pressure 115/68, pulse (!) 52, temperature 97.9 F (36.6 C), temperature source Oral, resp. rate 18, weight 138 lb 9.6 oz (62.9 kg).  Physical Exam: General : The patient is a healthy woman in no acute distress.  HEENT: normocephalic, extraoccular movements normal; neck is supple without thyromegally  Lynphnodes: Supraclavicular and inguinal nodes not enlarged  Abdomen: Soft, non-tender, no ascites, no organomegally, no masses, no hernias midline incisions well-healed. Pelvic:  EGBUS: Normal female  Vagina: Normal, no lesions  Urethra and Bladder: Normal, non-tender  Cervix: Surgically absent  Uterus: Surgically absent  Bi-manual examination: Non-tender; no adenxal masses or nodularity  Rectal: normal sphincter tone, no masses, no blood  Lower extremities: No edema or varicosities. Normal range of motion      No Known Allergies  Past Medical History:  Diagnosis Date  . Cancer (HCC)    Breast,sential node  . Chronic kidney disease    kidney stone now ct scan  . Tear of meniscus of knee joint    presently has a tear left knee    Past Surgical History:  Procedure Laterality Date  . ABDOMINAL HYSTERECTOMY  08/23/2012   Procedure: HYSTERECTOMY ABDOMINAL;  Surgeon: Alvino Chapel, MD;  Location: [facility];  Service: Gynecology;  Laterality: N/A;  Perineal biopsies  . APPENDECTOMY  AGE 74  . BREAST LUMPECTOMY  2005   RIGHT  . LYMPHADENECTOMY  08/23/2012   Procedure: LYMPHADENECTOMY;  Surgeon: Alvino Chapel, MD;  Location: [facility];  Service: Gynecology;;  Pelvic and para-aortic  . OMENTECTOMY  08/23/2012   Procedure: OMENTECTOMY;  Surgeon: Alvino Chapel, MD;  Location: [facility];  Service: Gynecology;;  . SALPINGOOPHORECTOMY  08/23/2012   Procedure: SALPINGO OOPHORECTOMY;  Surgeon: Alvino Chapel, MD;  Location: [facility];   Service: Gynecology;  Laterality: Bilateral;    Current Outpatient Prescriptions  Medication Sig Dispense Refill  . Multiple Vitamins-Minerals (MULTIVITAMIN ADULTS PO) Take by mouth.     No current facility-administered medications for this visit.     Social History   Social History  . Marital status: Married    Spouse name: N/A  . Number of children: N/A  . Years of education: N/A   Occupational History  . Not on file.   Social History Main Topics  . Smoking status: Never Smoker  . Smokeless tobacco: Never Used  . Alcohol use Yes     Comment: RARE MAYBE 2X MONTH  . Drug use: No  . Sexual activity: Yes   Other Topics Concern  . Not on file   Social History Narrative  . No narrative on file    Family History  Problem Relation Age of Onset  . Cancer Mother        UTERINE AGE 27  . Cancer Sister        BREAST AGE 23  . Cancer Maternal Aunt        Ovarian cancer      Marti Sleigh, MD 03/05/2017, 9:31 AM

## 2017-03-05 NOTE — Patient Instructions (Signed)
Plan to follow up in six months with labwork before.  Please call for any questions or concerns.

## 2017-03-22 DIAGNOSIS — H25013 Cortical age-related cataract, bilateral: Secondary | ICD-10-CM | POA: Diagnosis not present

## 2017-03-22 DIAGNOSIS — H35363 Drusen (degenerative) of macula, bilateral: Secondary | ICD-10-CM | POA: Diagnosis not present

## 2017-03-22 DIAGNOSIS — H40013 Open angle with borderline findings, low risk, bilateral: Secondary | ICD-10-CM | POA: Diagnosis not present

## 2017-03-22 DIAGNOSIS — H2513 Age-related nuclear cataract, bilateral: Secondary | ICD-10-CM | POA: Diagnosis not present

## 2017-04-13 DIAGNOSIS — W57XXXA Bitten or stung by nonvenomous insect and other nonvenomous arthropods, initial encounter: Secondary | ICD-10-CM | POA: Diagnosis not present

## 2017-04-13 DIAGNOSIS — S20369A Insect bite (nonvenomous) of unspecified front wall of thorax, initial encounter: Secondary | ICD-10-CM | POA: Diagnosis not present

## 2017-04-14 DIAGNOSIS — H9313 Tinnitus, bilateral: Secondary | ICD-10-CM | POA: Diagnosis not present

## 2017-04-14 DIAGNOSIS — H6983 Other specified disorders of Eustachian tube, bilateral: Secondary | ICD-10-CM | POA: Diagnosis not present

## 2017-04-14 DIAGNOSIS — H6123 Impacted cerumen, bilateral: Secondary | ICD-10-CM | POA: Diagnosis not present

## 2017-04-14 DIAGNOSIS — H6993 Unspecified Eustachian tube disorder, bilateral: Secondary | ICD-10-CM | POA: Insufficient documentation

## 2017-07-21 DIAGNOSIS — R922 Inconclusive mammogram: Secondary | ICD-10-CM | POA: Diagnosis not present

## 2017-07-21 DIAGNOSIS — Z1231 Encounter for screening mammogram for malignant neoplasm of breast: Secondary | ICD-10-CM | POA: Diagnosis not present

## 2017-07-21 DIAGNOSIS — Z853 Personal history of malignant neoplasm of breast: Secondary | ICD-10-CM | POA: Diagnosis not present

## 2017-07-26 DIAGNOSIS — Z1231 Encounter for screening mammogram for malignant neoplasm of breast: Secondary | ICD-10-CM | POA: Diagnosis not present

## 2017-07-26 DIAGNOSIS — Z853 Personal history of malignant neoplasm of breast: Secondary | ICD-10-CM | POA: Diagnosis not present

## 2017-07-26 DIAGNOSIS — R922 Inconclusive mammogram: Secondary | ICD-10-CM | POA: Diagnosis not present

## 2017-09-06 ENCOUNTER — Other Ambulatory Visit: Payer: Medicare Other

## 2017-09-06 DIAGNOSIS — D3910 Neoplasm of uncertain behavior of unspecified ovary: Secondary | ICD-10-CM

## 2017-09-06 DIAGNOSIS — C50919 Malignant neoplasm of unspecified site of unspecified female breast: Secondary | ICD-10-CM | POA: Diagnosis not present

## 2017-09-07 LAB — CA 125: Cancer Antigen (CA) 125: 14.9 U/mL (ref 0.0–38.1)

## 2017-09-07 LAB — CANCER ANTIGEN 27.29: CA 27.29: 22.3 U/mL (ref 0.0–38.6)

## 2017-09-10 ENCOUNTER — Ambulatory Visit: Payer: Medicare Other | Attending: Gynecology | Admitting: Gynecology

## 2017-09-10 ENCOUNTER — Other Ambulatory Visit: Payer: Medicare Other

## 2017-09-10 ENCOUNTER — Encounter: Payer: Self-pay | Admitting: Gynecology

## 2017-09-10 VITALS — BP 115/65 | HR 59 | Temp 98.3°F | Resp 18 | Ht 66.0 in | Wt 139.0 lb

## 2017-09-10 DIAGNOSIS — Z5189 Encounter for other specified aftercare: Secondary | ICD-10-CM | POA: Insufficient documentation

## 2017-09-10 DIAGNOSIS — Z803 Family history of malignant neoplasm of breast: Secondary | ICD-10-CM | POA: Diagnosis not present

## 2017-09-10 DIAGNOSIS — Z8041 Family history of malignant neoplasm of ovary: Secondary | ICD-10-CM | POA: Diagnosis not present

## 2017-09-10 DIAGNOSIS — Z853 Personal history of malignant neoplasm of breast: Secondary | ICD-10-CM | POA: Diagnosis not present

## 2017-09-10 DIAGNOSIS — D3910 Neoplasm of uncertain behavior of unspecified ovary: Secondary | ICD-10-CM

## 2017-09-10 DIAGNOSIS — N189 Chronic kidney disease, unspecified: Secondary | ICD-10-CM | POA: Diagnosis not present

## 2017-09-10 DIAGNOSIS — Z9889 Other specified postprocedural states: Secondary | ICD-10-CM | POA: Insufficient documentation

## 2017-09-10 DIAGNOSIS — Z90710 Acquired absence of both cervix and uterus: Secondary | ICD-10-CM | POA: Diagnosis not present

## 2017-09-10 DIAGNOSIS — Z8603 Personal history of neoplasm of uncertain behavior: Secondary | ICD-10-CM | POA: Diagnosis not present

## 2017-09-10 NOTE — Patient Instructions (Signed)
Doing great!  Plan on following up in one year or sooner if needed. Please call closer to the date to schedule (August or Sept 2019 for Dec 2019 appt) to arrange for your appointment and lab appointment before.  Please call for any needs or new symptoms.

## 2017-09-10 NOTE — Progress Notes (Signed)
Consult Note: Gyn-Onc   [name] 66 y.o. female  Chief Complaint  Patient presents with  . 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 Patient's clinically free of disease   Plan: She'll return in 12 months for followup and  we will repeat tumor markers at that time.    Interval History:   Patient returns today as previously scheduled. Since her last visit she's done well. She denies any GI, GU or pelvic symptoms.   Her Ca1 25 this week was 14.9  units per mL and a CA 2729 was 22 units per mL.  (Both stable and within the normal range)    HPI:66 year old white married female seen in consultation request of Dr. Edwinna Areola regarding bilateral ovarian neoplasms and an elevated CA 125. On routine laboratory testing a CA 125 was obtained and returned as 229 units per mL. Subsequent CT scan was obtained showing bilateral ovarian masses. The right measured 5.5 x 5.5 x 6.3 cm in the left measured 6.3 x 5.1 x 5.1 cm. There is no evidence of free fluid and only minimal prominence of a lymph node in the right lower quadrantThe patient has a remote history of breast cancer.  Family history may be significant in that her mother died at age 10 of uterine or cervix cancer, a sister at age 106 of breast cancer, and a maternal aunt may have had ovarian cancer. She is menopausal and not taking any hormone replacement therapy.   She underwent exploratory laparotomy on 08/23/2012. Both ovaries were enlarged with surface excrescences. On frozen section the tumors were considered to be borderline tumors. We proceeded with full surgical staging including pelvic and aortic lymphadenectomy, omentectomy, and peritoneal biopsies. Final pathology revealed a low malignant potential tumor with focal areas of microinvasion. All biopsies were negative for metastatic disease. The patient had an uncomplicated postoperative course. Given the final  pathology no adjuvant therapy was recommended.   Review of Systems:10 point review of systems is negative except as noted in interval history.   Vitals: Blood pressure 115/65, pulse (!) 59, temperature 98.3 F (36.8 C), temperature source Oral, resp. rate 18, height 5\' 6"  (1.676 m), weight 139 lb (63 kg).  Physical Exam: General : The patient is a healthy woman in no acute distress.  HEENT: normocephalic, extraoccular movements normal; neck is supple without thyromegally  Lynphnodes: Supraclavicular and inguinal nodes not enlarged  Abdomen: Soft, non-tender, no ascites, no organomegally, no masses, no hernias midline incisions well-healed. Pelvic:  EGBUS: Normal female  Vagina: Normal, no lesions  Urethra and Bladder: Normal, non-tender  Cervix: Surgically absent  Uterus: Surgically absent  Bi-manual examination: Non-tender; no adenxal masses or nodularity  Rectal: normal sphincter tone, no masses, no blood  Lower extremities: No edema or varicosities. Normal range of motion      No Known Allergies  Past Medical History:  Diagnosis Date  . Cancer (HCC)    Breast,sential node  . Chronic kidney disease    kidney stone now ct scan  . Tear of meniscus of knee joint    presently has a tear left knee    Past Surgical History:  Procedure Laterality Date  . ABDOMINAL HYSTERECTOMY  08/23/2012   Procedure: HYSTERECTOMY ABDOMINAL;  Surgeon: Alvino Chapel, MD;  Location: [facility];  Service: Gynecology;  Laterality: N/A;  Perineal biopsies  . APPENDECTOMY  AGE 3  . BREAST LUMPECTOMY  2005   RIGHT  .  LYMPHADENECTOMY  08/23/2012   Procedure: LYMPHADENECTOMY;  Surgeon: Alvino Chapel, MD;  Location: [facility];  Service: Gynecology;;  Pelvic and para-aortic  . OMENTECTOMY  08/23/2012   Procedure: OMENTECTOMY;  Surgeon: Alvino Chapel, MD;  Location: [facility];  Service: Gynecology;;  . SALPINGOOPHORECTOMY  08/23/2012   Procedure: SALPINGO OOPHORECTOMY;  Surgeon:  Alvino Chapel, MD;  Location: [facility];  Service: Gynecology;  Laterality: Bilateral;    Current Outpatient Medications  Medication Sig Dispense Refill  . Multiple Vitamins-Minerals (MULTIVITAMIN ADULTS PO) Take by mouth.     No current facility-administered medications for this visit.     Social History   Socioeconomic History  . Marital status: Married    Spouse name: Not on file  . Number of children: Not on file  . Years of education: Not on file  . Highest education level: Not on file  Social Needs  . Financial resource strain: Not on file  . Food insecurity - worry: Not on file  . Food insecurity - inability: Not on file  . Transportation needs - medical: Not on file  . Transportation needs - non-medical: Not on file  Occupational History  . Not on file  Tobacco Use  . Smoking status: Never Smoker  . Smokeless tobacco: Never Used  Substance and Sexual Activity  . Alcohol use: Yes    Comment: RARE MAYBE 2X MONTH  . Drug use: No  . Sexual activity: Yes  Other Topics Concern  . Not on file  Social History Narrative  . Not on file    Family History  Problem Relation Age of Onset  . Cancer Mother        UTERINE AGE 3  . Cancer Sister        BREAST AGE 57  . Cancer Maternal Aunt        Ovarian cancer      Marti Sleigh, MD 09/10/2017, 10:31 AM

## 2017-10-19 DIAGNOSIS — H903 Sensorineural hearing loss, bilateral: Secondary | ICD-10-CM | POA: Diagnosis not present

## 2017-12-06 ENCOUNTER — Ambulatory Visit
Admission: RE | Admit: 2017-12-06 | Discharge: 2017-12-06 | Disposition: A | Discharge status: Home / Self Care | Payer: Medicare Other | Source: Ambulatory Visit | Attending: Sports Medicine | Admitting: Sports Medicine

## 2017-12-06 ENCOUNTER — Encounter: Payer: Self-pay | Admitting: Sports Medicine

## 2017-12-06 ENCOUNTER — Ambulatory Visit (INDEPENDENT_AMBULATORY_CARE_PROVIDER_SITE_OTHER): Payer: Medicare Other | Admitting: Sports Medicine

## 2017-12-06 VITALS — BP 112/78 | Ht 66.5 in | Wt 138.0 lb

## 2017-12-06 DIAGNOSIS — M25561 Pain in right knee: Secondary | ICD-10-CM

## 2017-12-06 DIAGNOSIS — S8991XA Unspecified injury of right lower leg, initial encounter: Secondary | ICD-10-CM | POA: Diagnosis not present

## 2017-12-06 NOTE — Assessment & Plan Note (Addendum)
Acute.  Localized tenderness to medial side of right knee.  Unlikely septic arthritis.  Concerning for medial meniscal tear given history of locking and positive Thessley test.  Ultrasound performed in office without signs of effusion or obvious meniscal tear however there was a bone spur appreciated off the right femur which may be contributing to symptoms.  We need further imaging to investigate possible meniscal injury.  Patient is very active and would be a good candidate for surgery if indicated. - Complete radiographs of right knee - Recommended strength exercising of right knee continual use of knee brace - We will proceed with right knee MRI and contact patient with results

## 2017-12-06 NOTE — Progress Notes (Addendum)
Subjective   Patient ID: Lauren Calhoun    DOB: [birth year], 67 y.o. female   MRN: [id number]  CC: "Right knee pain"  HPI: Lauren Calhoun is a 67 y.o. female who presents to clinic today for the following:  Right knee pain: [place] when she felt like she hyperextended her right knee.  She denies trauma to the knee or any stress which may have provoked injury.  Patient had sudden pain to medial aspect of right knee immediately following the event and did have pain elicited while ambulation.  Patient was able to continue her yoga and Zumba classes throughout the week but did endorse a recurrent episode of hyperextension 3 days ago with similar pain.  She has been using an old knee brace from a prior left knee meniscal injury several years ago.  Of note, patient endorses locking sensation of right knee with complete flexion 3 months ago.  She denies fevers or chills, loss of motor function, change in sensation. Denies swelling.  ROS: see HPI for pertinent.  [place]: Breast cancer, urinary incontinence. Smoking status reviewed. Medications reviewed.  Objective   BP 112/78   Ht 5' 6.5" (1.689 m)   Wt 138 lb (62.6 kg)   BMI 21.94 kg/m  Vitals and nursing note reviewed.  General: well nourished, well developed, NAD with non-toxic appearance HEENT: normocephalic, atraumatic, moist mucous membranes Cardiovascular: regular rate and rhythm without murmurs, rubs, or gallops Lungs: clear to auscultation bilaterally with normal work of breathing Skin: warm, dry, no rashes or lesions, cap refill < 2 seconds MSK: valgus knees with standing bilaterally, no signs of edema or ecchymoses on knees, no crepitus or mass palpated, passive and active ROM intact on right knee, 5/5 motor strength on legs bilaterally, right knee stable without signs of ligament injury, negative valgus and varus stress, negative McMurray's, positive Thessaly test on right knee, neurovascular  intact  Assessment & Plan   Acute pain of right knee Acute.  Localized tenderness to medial side of right knee.  Unlikely septic arthritis.  Concerning for medial meniscal tear given history of locking and positive Thessley test.  Ultrasound performed in office without signs of effusion or obvious meniscal tear however there was a bone spur appreciated off the right femur which may be contributing to symptoms.  We need further imaging to investigate possible meniscal injury.  Patient is very active and would be a good candidate for surgery if indicated. - Complete radiographs of right knee - Recommended strength exercising of right knee continual use of knee brace - We will proceed with right knee MRI and contact patient with results  Orders Placed This Encounter  Procedures  . DG Knee Complete 4 Views Right    Standing Status:   Future    Standing Expiration Date:   02/06/2019    Order Specific Question:   Reason for Exam (SYMPTOM  OR DIAGNOSIS REQUIRED)    Answer:   right knee pain; standing AP, lateral and oblique views    Order Specific Question:   Preferred imaging location?    Answer:   [facility]    Order Specific Question:   Radiology Contrast Protocol - do NOT remove file path    Answer:   \\[email or web address]  . MR Knee Right Wo Contrast    Standing Status:   Future    Standing Expiration Date:   02/06/2019    Order Specific Question:   What is  the patient's sedation requirement?    Answer:   No Sedation    Order Specific Question:   Does the patient have a pacemaker or implanted devices?    Answer:   No    Order Specific Question:   Preferred imaging location?    Answer:   [facility] (table limit-550lbs)    Order Specific Question:   Radiology Contrast Protocol - do NOT remove file path    Answer:   \\[email or web address]    Order Specific Question:   Reason for Exam additional comments    Answer:    rule out meniscal tear   No orders of the defined types were placed in this encounter.   Harriet But[place], [place], PGY-2 12/06/2017, 9:23 AM   Patient seen and evaluated with the resident. I agree with the above plan of care. We will get x-rays to rule out any sort of obvious loose body which may be responsible for her knee locking. If unremarkable, proceed with MRI specifically to rule out a bucket-handle meniscal tear which would need arthroscopic intervention. Phone follow-up with those results when available. In the meantime, compression sleeve with activity, isometric quad exercises, and half squats.  Addendum: X-rays negative.

## 2017-12-07 ENCOUNTER — Telehealth: Payer: Self-pay | Admitting: Sports Medicine

## 2017-12-07 NOTE — Telephone Encounter (Signed)
  I spoke with Lauren Calhoun on the phone today after reviewing both x-rays and the MRI of her right knee. X-ray is unremarkable. MRI shows tears to both the medial and lateral menisci. Small focus of full-thickness cartilage defect in the medial femoral condyle. We discussed treatment options including continuing with conservative treatment versus orthopedic surgical consultation. We both agree that she should continue with conservative treatment for the time being. She did have a remote episode of locking and I did explain to her that if she has recurring locking of the knee then she should reconsider surgical consultation. She understands. Otherwise, as long as her symptoms continue to improve, she may increase activity as tolerated and will follow our treatment plan as outlined in yesterday's progress note. Follow-up as needed.

## 2018-01-10 DIAGNOSIS — D485 Neoplasm of uncertain behavior of skin: Secondary | ICD-10-CM | POA: Diagnosis not present

## 2018-01-10 DIAGNOSIS — C44622 Squamous cell carcinoma of skin of right upper limb, including shoulder: Secondary | ICD-10-CM | POA: Diagnosis not present

## 2018-02-18 ENCOUNTER — Other Ambulatory Visit: Payer: Self-pay

## 2018-02-18 DIAGNOSIS — Z1329 Encounter for screening for other suspected endocrine disorder: Secondary | ICD-10-CM

## 2018-02-18 DIAGNOSIS — Z0000 Encounter for general adult medical examination without abnormal findings: Secondary | ICD-10-CM

## 2018-02-18 DIAGNOSIS — E785 Hyperlipidemia, unspecified: Secondary | ICD-10-CM

## 2018-03-07 ENCOUNTER — Ambulatory Visit (INDEPENDENT_AMBULATORY_CARE_PROVIDER_SITE_OTHER): Payer: Medicare Other | Admitting: Internal Medicine

## 2018-03-07 ENCOUNTER — Encounter: Payer: Self-pay | Admitting: Internal Medicine

## 2018-03-07 ENCOUNTER — Other Ambulatory Visit: Payer: Self-pay | Admitting: Internal Medicine

## 2018-03-07 ENCOUNTER — Other Ambulatory Visit: Payer: Medicare Other | Admitting: Internal Medicine

## 2018-03-07 VITALS — BP 102/80 | HR 60 | Ht 65.5 in | Wt 137.0 lb

## 2018-03-07 DIAGNOSIS — L900 Lichen sclerosus et atrophicus: Secondary | ICD-10-CM | POA: Diagnosis not present

## 2018-03-07 DIAGNOSIS — M8580 Other specified disorders of bone density and structure, unspecified site: Secondary | ICD-10-CM

## 2018-03-07 DIAGNOSIS — Z8543 Personal history of malignant neoplasm of ovary: Secondary | ICD-10-CM

## 2018-03-07 DIAGNOSIS — Z0000 Encounter for general adult medical examination without abnormal findings: Secondary | ICD-10-CM

## 2018-03-07 DIAGNOSIS — Z853 Personal history of malignant neoplasm of breast: Secondary | ICD-10-CM | POA: Diagnosis not present

## 2018-03-07 DIAGNOSIS — R8290 Unspecified abnormal findings in urine: Secondary | ICD-10-CM

## 2018-03-07 LAB — POCT URINALYSIS DIPSTICK
Appearance: NORMAL
Bilirubin, UA: NEGATIVE
Glucose, UA: NEGATIVE
Ketones, UA: NEGATIVE
Leukocytes, UA: NEGATIVE
Nitrite, UA: NEGATIVE
Odor: NORMAL
Protein, UA: POSITIVE — AB
Spec Grav, UA: 1.015 (ref 1.010–1.025)
Urobilinogen, UA: 0.2 E.U./dL
pH, UA: 6 (ref 5.0–8.0)

## 2018-03-07 MED ORDER — CLOBETASOL PROPIONATE 0.05 % EX CREA: 1.0000 "application " | TOPICAL_CREAM | Freq: Two times a day (BID) | CUTANEOUS | 0 refills | Status: DC

## 2018-03-07 NOTE — Telephone Encounter (Signed)
Pt prefers the other cream can she pay out of pocket?

## 2018-03-07 NOTE — Progress Notes (Signed)
Subjective:    Patient ID: Lauren Calhoun, female    DOB: [birth year], 67 y.o.   MRN: [id number]  HPI 67 year old Female in today for his first annual Medicare wellness, routine health maintenance and evaluation of medical issues.  She has a history of mild leukopenia and has been seen by Hematologist who noted that peripheral blood smear showed no evidence of myelodysplasia.  Her LDH was normal.  He did not feel she had any lymphoproliferative process and Myeloma panel was normal.  History of stage I C ovarian tumor followed by GYN oncology and remote history of stage I right breast hers 2/new positive malignancy.  She was referred here by Dr. Jana Hakim for primary care in 2012.  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  0 out of 1 sentinel lymph nodes involved.  She received Taxol/Herceptin followed by radiation all of which was completed April 2006.  History of osteoporosis.  Bone density study February 2012 showed T score in the LS spine -2.5 and in the left femoral neck -1.4.  However bone density study in 2017 showed AP spine T score to be -2.3 and left femoral neck -1.9.  She has not wanted to be on bone sparing medication.  No known drug allergies  History of right ankle fracture at age 83.  Appendectomy at age 7.  History of kidney stones seen on CT scan.  Social history: She is married has 3 adult daughters.  She is now retired.  Family history: Father died at age 10.  Mother died at age 91 uterine cancer.  Sister died at age 21 of breast cancer.  One sister with history of hypertension.  3 adult daughters are in good health.  With regard to ovarian cancer she was found to have bilateral ovarian neoplasms in 2013 with an elevated CEA 125 by Dr. Edwinna Areola.  CT scan showed bilateral ovarian masses measuring 5.5 x 5.5 x 6.3 cm on the right and the left measured  6.3 x 5.1 x 5.1 cm.  No evidence of free fluid.  Had minimal prominence of left nodes noted in the right lower quadrant.  She underwent total abdominal hysterectomy, BSO, bilateral pelvic lymphadenectomy, periaortic lymphadenectomy, omentectomy, multiple peritoneal biopsies November 2013.  All biopsies were negative for metastatic disease.  Pathology showed low malignant potential tumor with focal areas of microinvasion.  On frozen section the tumors were considered to be borderline tumors.  No adjuvant therapy was recommended.  She is been followed regularly since that time.  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  CA 125 returned to normal.  Colonoscopy done 2018  History of osteopenia on bone density study 2017 with lowest T score -2.3.  Needs to have bone density study in the near future.  History of bladder dysfunction previously seen by Dr. Vikki Ports in 2014    Review of Systems no new complaints     Objective:   Physical Exam  Constitutional: She is oriented to person, place, and time. She appears well-developed and well-nourished. No distress.  HENT:  Head: Normocephalic and atraumatic.  Right Ear: External ear normal.  Left Ear: External ear normal.  Mouth/Throat: Oropharynx is clear and moist.  Eyes: Pupils are equal, round, and reactive to light. EOM are normal. Right eye exhibits no discharge. Left eye exhibits no discharge. No scleral icterus.  Neck: Neck supple.  No JVD present. No thyromegaly present.  Cardiovascular: Normal rate, regular rhythm, normal heart sounds and intact distal pulses. Exam reveals no friction rub.  No murmur heard. Pulmonary/Chest: Effort normal and breath sounds normal. No stridor. No respiratory distress. She has no wheezes. She has no rales.  Breasts  without masses  Abdominal: Soft. She exhibits no distension and no mass. There is no tenderness. There is no rebound and no guarding.  Genitourinary:  Genitourinary  Comments: Deferred status post extensive pelvic surgery and hysterectomy and oophorectomy with ovarian cancer  Musculoskeletal: She exhibits no edema.  Lymphadenopathy:    She has no cervical adenopathy.  Neurological: She is alert and oriented to person, place, and time. She displays normal reflexes. No cranial nerve deficit or sensory deficit. She exhibits normal muscle tone. Coordination normal.  Skin: Skin is warm and dry. She is not diaphoretic.  Psychiatric: She has a normal mood and affect. Her behavior is normal. Judgment and thought content normal.  Vitals reviewed.         Assessment & Plan:  Osteopenia-needs bone density study in the near future.  Patient has not wanted to consider Prolia.  History of ovarian cancer followed by GYN oncology status post extensive surgery and described as stage I C low malignant potential  History of breast cancer without evidence of recurrence  History of leukopenia-seen by hematologist and we will continue to monitor white blood cell count.  This is been stable.  Plan: Is to return in 1 year or as needed.  To have bone density study in the near future.  Subjective:   Patient presents for Medicare Annual/Subsequent preventive examination.  Review Past Medical/Family/Social: See above   Risk Factors  Current exercise habits: Exercises regularly-yoga Dietary issues discussed: Low-fat low carbohydrate followed  Cardiac risk factors: None  Depression Screen  (Note: if answer to either of the following is "Yes", a more complete depression screening is indicated)   Over the past two weeks, have you felt down, depressed or hopeless? No  Over the past two weeks, have you felt little interest or pleasure in doing things? No Have you lost interest or pleasure in daily life? No Do you often feel hopeless? No Do you cry easily over simple problems? No   Activities of Daily Living  In your present state of health, do you have any  difficulty performing the following activities?:   Driving? No  Managing money? No  Feeding yourself? No  Getting from bed to chair? No  Climbing a flight of stairs? No  Preparing food and eating?: No  Bathing or showering? No  Getting dressed: No  Getting to the toilet? No  Using the toilet:No  Moving around from place to place: No  In the past year have you fallen or had a near fall?:No  Are you sexually active? No  Do you have more than one partner? No   Hearing Difficulties: No  Do you often ask people to speak up or repeat themselves? No  Do you experience ringing or noises in your ears? No  Do you have difficulty understanding soft or whispered voices? No  Do you feel that you have a problem with memory? No Do you often misplace items? No    Home Safety:  Do you have a smoke alarm at your residence? Yes Do you have grab bars in the bathroom?  No Do you have throw rugs in your house?  Yes   Cognitive Testing  Alert? Yes Normal  Appearance?Yes  Oriented to person? Yes Place? Yes  Time? Yes  Recall of three objects? Yes  Can perform simple calculations? Yes  Displays appropriate judgment?Yes  Can read the correct time from a watch face?Yes   List the Names of Other Physician/Practitioners you currently use:  See referral list for the physicians patient is currently seeing.     Review of Systems: See above   Objective:     General appearance: Appears stated age  Head: Normocephalic, without obvious abnormality, atraumatic  Eyes: conj clear, EOMi PEERLA  Ears: normal TM's and external ear canals both ears  Nose: Nares normal. Septum midline. Mucosa normal. No drainage or sinus tenderness.  Throat: lips, mucosa, and tongue normal; teeth and gums normal  Neck: no adenopathy, no carotid bruit, no JVD, supple, symmetrical, trachea midline and thyroid not enlarged, symmetric, no tenderness/mass/nodules  No CVA tenderness.  Lungs: clear to auscultation bilaterally   Breasts: normal appearance, no masses or tenderness Heart: regular rate and rhythm, S1, S2 normal, no murmur, click, rub or gallop  Abdomen: soft, non-tender; bowel sounds normal; no masses, no organomegaly  Musculoskeletal: ROM normal in all joints, no crepitus, no deformity, Normal muscle strengthen. Back  is symmetric, no curvature. Skin: Skin color, texture, turgor normal. No rashes or lesions  Lymph nodes: Cervical, supraclavicular, and axillary nodes normal.  Neurologic: CN 2 -12 Normal, Normal symmetric reflexes. Normal coordination and gait  Psych: Alert & Oriented x 3, Mood appear stable.    Assessment:    Annual wellness medicare exam   Plan:    During the course of the visit the patient was educated and counseled about appropriate screening and preventive services including:   Annual mammogram  Repeat bone density study     Patient Instructions (the written plan) was given to the patient.  Medicare Attestation  I have personally reviewed:  The patient's medical and social history  Their use of alcohol, tobacco or illicit drugs  Their current medications and supplements  The patient's functional ability including ADLs,fall risks, home safety risks, cognitive, and hearing and visual impairment  Diet and physical activities  Evidence for depression or mood disorders  The patient's weight, height, BMI, and visual acuity have been recorded in the chart. I have made referrals, counseling, and provided education to the patient based on review of the above and I have provided the patient with a written personalized care plan for preventive services.

## 2018-03-08 LAB — URINALYSIS, MICROSCOPIC ONLY
Bacteria, UA: NONE SEEN /HPF
Hyaline Cast: NONE SEEN /LPF
WBC, UA: NONE SEEN /HPF (ref 0–5)

## 2018-03-08 LAB — URINE CULTURE
MICRO NUMBER:: 90664441
SPECIMEN QUALITY:: ADEQUATE

## 2018-03-09 ENCOUNTER — Other Ambulatory Visit (INDEPENDENT_AMBULATORY_CARE_PROVIDER_SITE_OTHER): Payer: Medicare Other | Admitting: Internal Medicine

## 2018-03-09 DIAGNOSIS — Z13220 Encounter for screening for lipoid disorders: Secondary | ICD-10-CM

## 2018-03-09 DIAGNOSIS — Z1329 Encounter for screening for other suspected endocrine disorder: Secondary | ICD-10-CM

## 2018-03-09 DIAGNOSIS — E7800 Pure hypercholesterolemia, unspecified: Secondary | ICD-10-CM

## 2018-03-09 DIAGNOSIS — Z0000 Encounter for general adult medical examination without abnormal findings: Secondary | ICD-10-CM

## 2018-03-09 NOTE — Addendum Note (Signed)
Addended by: Mady Haagensen on: 03/09/2018 09:04 AM   Modules accepted: Orders

## 2018-03-10 LAB — CBC WITH DIFFERENTIAL/PLATELET
Basophils Absolute: 10 cells/uL (ref 0–200)
Basophils Relative: 0.3 %
Eosinophils Absolute: 119 cells/uL (ref 15–500)
Eosinophils Relative: 3.5 %
HCT: 42.4 % (ref 35.0–45.0)
Hemoglobin: 14.2 g/dL (ref 11.7–15.5)
Lymphs Abs: 1489 cells/uL (ref 850–3900)
MCH: 29.6 pg (ref 27.0–33.0)
MCHC: 33.5 g/dL (ref 32.0–36.0)
MCV: 88.3 fL (ref 80.0–100.0)
MPV: 10.5 fL (ref 7.5–12.5)
Monocytes Relative: 9.4 %
Neutro Abs: 1462 cells/uL — ABNORMAL LOW (ref 1500–7800)
Neutrophils Relative %: 43 %
Platelets: 232 10*3/uL (ref 140–400)
RBC: 4.8 10*6/uL (ref 3.80–5.10)
RDW: 13 % (ref 11.0–15.0)
Total Lymphocyte: 43.8 %
WBC mixed population: 320 cells/uL (ref 200–950)
WBC: 3.4 10*3/uL — ABNORMAL LOW (ref 3.8–10.8)

## 2018-03-10 LAB — COMPLETE METABOLIC PANEL WITH GFR
AG Ratio: 2 (calc) (ref 1.0–2.5)
ALT: 15 U/L (ref 6–29)
AST: 21 U/L (ref 10–35)
Albumin: 4.4 g/dL (ref 3.6–5.1)
Alkaline phosphatase (APISO): 76 U/L (ref 33–130)
BUN: 13 mg/dL (ref 7–25)
CO2: 30 mmol/L (ref 20–32)
Calcium: 10.1 mg/dL (ref 8.6–10.4)
Chloride: 107 mmol/L (ref 98–110)
Creat: 0.86 mg/dL (ref 0.50–0.99)
GFR, Est African American: 82 mL/min/{1.73_m2} (ref >=60)
GFR, Est Non African American: 70 mL/min/{1.73_m2} (ref >=60)
Globulin: 2.2 g/dL (calc) (ref 1.9–3.7)
Glucose, Bld: 81 mg/dL (ref 65–99)
Potassium: 5 mmol/L (ref 3.5–5.3)
Sodium: 143 mmol/L (ref 135–146)
Total Bilirubin: 0.7 mg/dL (ref 0.2–1.2)
Total Protein: 6.6 g/dL (ref 6.1–8.1)

## 2018-03-10 LAB — LIPID PANEL
Cholesterol: 207 mg/dL — ABNORMAL HIGH (ref <200)
HDL: 80 mg/dL (ref >50)
LDL Cholesterol (Calc): 114 mg/dL (calc) — ABNORMAL HIGH
Non-HDL Cholesterol (Calc): 127 mg/dL (calc) (ref <130)
Total CHOL/HDL Ratio: 2.6 (calc) (ref <5.0)
Triglycerides: 51 mg/dL (ref <150)

## 2018-03-10 LAB — TSH: TSH: 2.25 mIU/L (ref 0.40–4.50)

## 2018-04-02 NOTE — Patient Instructions (Addendum)
It was a pleasure to see you today.  Return in 1 year or as needed.  Have bone density study.  Okay to refill medication for lichen sclerosus.

## 2018-04-25 ENCOUNTER — Ambulatory Visit (INDEPENDENT_AMBULATORY_CARE_PROVIDER_SITE_OTHER): Payer: Medicare Other | Admitting: Sports Medicine

## 2018-04-25 VITALS — BP 110/78 | Ht 66.0 in | Wt 137.0 lb

## 2018-04-25 DIAGNOSIS — S93492A Sprain of other ligament of left ankle, initial encounter: Secondary | ICD-10-CM | POA: Diagnosis present

## 2018-04-25 NOTE — Progress Notes (Signed)
   Subjective:    Patient ID: Lauren Calhoun, female    DOB: [birth year], 67 y.o.   MRN: [id number]  HPI chief complaint: Left ankle pain  Patient comes in today complaining of 3 days of lateral left ankle pain and swelling. She suffered an inversion injury when stepping into a pothole 3 days ago. Immediate pain and swelling. She also developed ecchymosis. She was very diligent about resting, icing, compression, and elevation. As a result, her swelling has improved by about 50%. All of her pain is along the lateral ankle. She denies pain in the medial ankle. No pain in the foot. She denies any significant injuries to this ankle in the past. She's been using an Ace wrap for compression. She is able to ambulate.    Review of Systems As above    Objective:   Physical Exam Well-developed, well-nourished. No acute distress.Awake alert and oriented 3. Vital signs reviewed  Left ankle: Mild amount of residual soft tissue swelling over the lateral ankle. Mild residual ecchymosis. Patient is tender to palpation at the ATF and at the calcaneal fibular ligament. No tenderness to palpation over the distal fibula. No tenderness over the medial malleolus, navicular, nor base of the fifth metatarsal. Mildly decreased range of motion. Positive anterior drawer, slightly positive talar tilt. Good pulses. Neurovascularly intact distally. She is able to walk comfortably with a minimal limp.  Brief bedside ultrasound shows no obvious fracture through the distal fibula. There is irregularity and hypoechoic changes in the ATF consistent with a tear here.       Assessment & Plan:   Right ankle pain and swelling secondary to grade 1 to grade 2 ankle sprain  Body helix compression sleeve with activity. Patient will start a home exercise program concentrating at first on range of motion and then advancing to strengthening as tolerated. She may resume recreational walking as tolerated. She may also resume yoga as  tolerated. Follow-up with me in 3 weeks for reevaluation. The plan will be to advance her to proprioception exercises at that time. Today's bedside ultrasound does not show any obvious fracture and clinical picture does not suggest fracture either. However, if her symptoms worsen she will contact me and I'll consider merits of x-ray. Call with questions or concerns in the interim.

## 2018-04-26 ENCOUNTER — Telehealth: Payer: Self-pay | Admitting: *Deleted

## 2018-04-26 NOTE — Telephone Encounter (Signed)
Returned the patient's call and scheduled lab/MD appts for December

## 2018-05-05 ENCOUNTER — Other Ambulatory Visit: Payer: Self-pay

## 2018-05-05 MED ORDER — HALOBETASOL PROPIONATE 0.05 % EX CREA: TOPICAL_CREAM | Freq: Two times a day (BID) | CUTANEOUS | 0 refills | Status: DC

## 2018-05-05 NOTE — Telephone Encounter (Signed)
I spoke with the pharmacist and this what her insurance recommends. Patient is okay with this cream.

## 2018-05-16 ENCOUNTER — Encounter: Payer: Self-pay | Admitting: Sports Medicine

## 2018-05-16 ENCOUNTER — Ambulatory Visit (INDEPENDENT_AMBULATORY_CARE_PROVIDER_SITE_OTHER): Payer: Medicare Other | Admitting: Sports Medicine

## 2018-05-16 VITALS — BP 112/82 | Ht 66.5 in | Wt 135.0 lb

## 2018-05-16 DIAGNOSIS — S93492A Sprain of other ligament of left ankle, initial encounter: Secondary | ICD-10-CM | POA: Diagnosis not present

## 2018-05-16 NOTE — Progress Notes (Signed)
   Subjective:    Patient ID: Lauren Calhoun, female    DOB: [birth year], 67 y.o.   MRN: [id number]  HPI   Lauren Calhoun comes in today for follow-up on a left ankle sprain. She is doing very well. No pain. Swelling has improved. She has been doing her home exercises, including proprioception. She is anxious to return to recreational walking as well as her "silk classes".  She has no concerns today.    Review of Systems As above    Objective:   Physical Exam  Well-developed, well-nourished. No acute distress.  Left ankle: Full range of motion. No effusion. Mild soft tissue swelling laterally. Slightly positive anterior drawer and a 1+ talar tilt. No tenderness to palpation. Neurovascularly intact distally. Walking without a limp.      Assessment & Plan:   Improving left ankle sprain  Patient will continue with her home exercises, concentrating on strengthening and proprioception for the next 4 weeks. She may return to activity as tolerated but I've encouraged her to wear her body helix compression sleeve when exercising. I also think she should wait about another 4 weeks or so before resuming her "silk rope class". As long as she is able to return to her previous level of activity without complication she may follow-up with me as needed.

## 2018-05-23 ENCOUNTER — Telehealth: Payer: Self-pay

## 2018-05-23 NOTE — Telephone Encounter (Signed)
Instructed pt to continue buddy taping the 4th and 5th toes, icing and NSAIDs as needed as she has been. If the pain isn't getting better she will call us.

## 2018-05-26 DIAGNOSIS — H2513 Age-related nuclear cataract, bilateral: Secondary | ICD-10-CM | POA: Diagnosis not present

## 2018-05-26 DIAGNOSIS — H25013 Cortical age-related cataract, bilateral: Secondary | ICD-10-CM | POA: Diagnosis not present

## 2018-05-26 DIAGNOSIS — H40013 Open angle with borderline findings, low risk, bilateral: Secondary | ICD-10-CM | POA: Diagnosis not present

## 2018-05-26 DIAGNOSIS — H35363 Drusen (degenerative) of macula, bilateral: Secondary | ICD-10-CM | POA: Diagnosis not present

## 2018-07-01 ENCOUNTER — Encounter: Payer: Self-pay | Admitting: Internal Medicine

## 2018-07-01 ENCOUNTER — Other Ambulatory Visit: Payer: Self-pay | Admitting: Internal Medicine

## 2018-07-01 ENCOUNTER — Ambulatory Visit (INDEPENDENT_AMBULATORY_CARE_PROVIDER_SITE_OTHER): Payer: Medicare Other | Admitting: Internal Medicine

## 2018-07-01 VITALS — BP 120/90 | HR 56 | Wt 138.0 lb

## 2018-07-01 DIAGNOSIS — Z23 Encounter for immunization: Secondary | ICD-10-CM

## 2018-07-01 DIAGNOSIS — R413 Other amnesia: Secondary | ICD-10-CM

## 2018-07-01 DIAGNOSIS — F411 Generalized anxiety disorder: Secondary | ICD-10-CM

## 2018-07-01 NOTE — Patient Instructions (Signed)
TSH was checked in June and was normal.  B12 and folate levels checked.  Referral to [facility] for Neuropsychological testing.  Recommend psychologist for counseling.  Tdap vaccine given at patient request due to impending birth of grandchild.

## 2018-07-01 NOTE — Progress Notes (Signed)
   Subjective:    Patient ID: Lauren Calhoun, female    DOB: 08-Dec-1950, 67 y.o.   MRN: [id number]  HPI   67 year old Female for several issues:   Problem #1-Needs Tdap as her daughter is 8 months pregnant and last tetanus immunization was at an urgent care in 2012 and don't know if Tdap or just plain tetanus toxoid but was likely Tdap given the year. Anyway, booster given today.  Problem #2-memory loss. TSH checked in June and was WNL Hx chemotherapy  For breast cancer 2005 and noted some memory issues then. Had some trouble finding her way to [facility] where she had been going for years. She would get anxiou and her oncologist asked that she pull over and think about how to get there and if necessary drive back home. This worked well for her. Check B12 and folate checked today  Problem #3- situational stress with husband who she says has anxiety issues. Have recommended psychologist, Doroteo Glassman to her. Husband can be volatile at times and can have erratic driving. She is going to suggest an evaluation to him as well. He smokes marijuans daily and has done so for years.He has told her they have discussed some topics that she just cannot recall speaking with him about which has worried her. She would like to have memory testing done for herself.    Review of Systems see above     Objective:   Physical Exam She is alert and oriented x3.  Knows day of week month and year and knows president of the [place].  She is able to do serial sevens fairly well.  She can remember 3 items after 5 minutes.  She is somewhat tearful in the office explaining situation with her husband       Assessment & Plan:  Situational stress with husband-recommend psychologist for counseling  Possible memory loss but I think some of this is related to anxiety-refer for neuropsychological testing  History of ovarian and breast cancer status post chemotherapy for breast cancer  Tdap given today.

## 2018-07-02 LAB — FOLATE: Folate: 13 ng/mL

## 2018-07-02 LAB — VITAMIN B12: Vitamin B-12: 362 pg/mL (ref 200–1100)

## 2018-07-12 DIAGNOSIS — F4323 Adjustment disorder with mixed anxiety and depressed mood: Secondary | ICD-10-CM | POA: Diagnosis not present

## 2018-07-25 DIAGNOSIS — Z1231 Encounter for screening mammogram for malignant neoplasm of breast: Secondary | ICD-10-CM | POA: Diagnosis not present

## 2018-07-25 DIAGNOSIS — Z853 Personal history of malignant neoplasm of breast: Secondary | ICD-10-CM | POA: Diagnosis not present

## 2018-07-25 LAB — HM MAMMOGRAPHY

## 2018-07-26 ENCOUNTER — Encounter: Payer: Self-pay | Admitting: Internal Medicine

## 2018-08-01 DIAGNOSIS — F4323 Adjustment disorder with mixed anxiety and depressed mood: Secondary | ICD-10-CM | POA: Diagnosis not present

## 2018-09-05 ENCOUNTER — Encounter

## 2018-09-09 ENCOUNTER — Other Ambulatory Visit: Payer: Self-pay | Admitting: Gynecologic Oncology

## 2018-09-09 ENCOUNTER — Inpatient Hospital Stay: Payer: Medicare Other | Attending: Gynecology

## 2018-09-09 DIAGNOSIS — Z90722 Acquired absence of ovaries, bilateral: Secondary | ICD-10-CM | POA: Insufficient documentation

## 2018-09-09 DIAGNOSIS — R971 Elevated cancer antigen 125 [CA 125]: Secondary | ICD-10-CM | POA: Insufficient documentation

## 2018-09-09 DIAGNOSIS — C50919 Malignant neoplasm of unspecified site of unspecified female breast: Secondary | ICD-10-CM

## 2018-09-09 DIAGNOSIS — D3910 Neoplasm of uncertain behavior of unspecified ovary: Secondary | ICD-10-CM

## 2018-09-09 DIAGNOSIS — Z90710 Acquired absence of both cervix and uterus: Secondary | ICD-10-CM | POA: Diagnosis not present

## 2018-09-10 LAB — CA 125: Cancer Antigen (CA) 125: 12.9 U/mL (ref 0.0–38.1)

## 2018-09-10 LAB — CANCER ANTIGEN 27.29: CA 27.29: 20.2 U/mL (ref 0.0–38.6)

## 2018-09-12 DIAGNOSIS — F4323 Adjustment disorder with mixed anxiety and depressed mood: Secondary | ICD-10-CM | POA: Diagnosis not present

## 2018-09-13 ENCOUNTER — Encounter: Payer: Self-pay | Admitting: Gynecology

## 2018-09-13 ENCOUNTER — Inpatient Hospital Stay (HOSPITAL_BASED_OUTPATIENT_CLINIC_OR_DEPARTMENT_OTHER): Payer: Medicare Other | Admitting: Gynecology

## 2018-09-13 VITALS — BP 123/74 | HR 50 | Temp 98.2°F | Resp 16 | Ht 66.0 in | Wt 137.0 lb

## 2018-09-13 DIAGNOSIS — R971 Elevated cancer antigen 125 [CA 125]: Secondary | ICD-10-CM

## 2018-09-13 DIAGNOSIS — Z90710 Acquired absence of both cervix and uterus: Secondary | ICD-10-CM

## 2018-09-13 DIAGNOSIS — D3910 Neoplasm of uncertain behavior of unspecified ovary: Secondary | ICD-10-CM

## 2018-09-13 DIAGNOSIS — Z90722 Acquired absence of ovaries, bilateral: Secondary | ICD-10-CM

## 2018-09-13 NOTE — Patient Instructions (Addendum)
Plan to follow up in one year or sooner if needed with tumor markers before that visit.  Please call the office closer to the date at [phone] to schedule your appt for Dec 2020.

## 2018-09-13 NOTE — Progress Notes (Signed)
Consult Note: Gyn-Onc   [name] 67 y.o. female  Chief Complaint  Patient presents with  . 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 Patient's clinically free of disease   Plan: She'll return in 12 months for followup and  we will repeat tumor markers at that time.    Interval History:   Patient returns today as previously scheduled. Since her last visit she's done well. She denies any GI, GU or pelvic symptoms.  Her health is been good.   Her Ca1 25 this week was 12.9  units per mL and a CA 2729 was 20.2units per mL.  (Both stable and within the normal range).  Is a new form of exercise the patient is taken up "aerial silks".  HPI:67 year old white married female seen in consultation request of Dr. Edwinna Areola regarding bilateral ovarian neoplasms and an elevated CA 125. On routine laboratory testing a CA 125 was obtained and returned as 229 units per mL. Subsequent CT scan was obtained showing bilateral ovarian masses. The right measured 5.5 x 5.5 x 6.3 cm in the left measured 6.3 x 5.1 x 5.1 cm. There is no evidence of free fluid and only minimal prominence of a lymph node in the right lower quadrantThe patient has a remote history of breast cancer.  Family history may be significant in that her mother died at age 24 of uterine or cervix cancer, a sister at age 96 of breast cancer, and a maternal aunt may have had ovarian cancer. She is menopausal and not taking any hormone replacement therapy.   She underwent exploratory laparotomy on 08/23/2012. Both ovaries were enlarged with surface excrescences. On frozen section the tumors were considered to be borderline tumors. We proceeded with full surgical staging including pelvic and aortic lymphadenectomy, omentectomy, and peritoneal biopsies. Final pathology revealed a low malignant potential tumor with focal areas of microinvasion. All biopsies were negative for  metastatic disease. The patient had an uncomplicated postoperative course. Given the final pathology no adjuvant therapy was recommended.   Review of Systems:10 point review of systems is negative except as noted in interval history.   Vitals: Blood pressure 123/74, pulse (!) 50, temperature 98.2 F (36.8 C), temperature source Oral, resp. rate 16, height 5\' 6"  (1.676 m), weight 137 lb (62.1 kg), SpO2 100 %.  Physical Exam: General : The patient is a healthy woman in no acute distress.  HEENT: normocephalic, extraoccular movements normal; neck is supple without thyromegally  Lynphnodes: Supraclavicular and inguinal nodes not enlarged  Abdomen: Soft, non-tender, no ascites, no organomegally, no masses, no hernias midline incisions well-healed. Pelvic:  EGBUS: Normal female  Vagina: Normal, no lesions  Urethra and Bladder: Normal, non-tender  Cervix: Surgically absent  Uterus: Surgically absent  Bi-manual examination: Non-tender; no adenxal masses or nodularity  Rectal: normal sphincter tone, no masses, no blood  Lower extremities: No edema or varicosities. Normal range of motion      No Known Allergies  Past Medical History:  Diagnosis Date  . Cancer (HCC)    Breast,sential node  . Chronic kidney disease    kidney stone now ct scan  . Tear of meniscus of knee joint    presently has a tear left knee    Past Surgical History:  Procedure Laterality Date  . ABDOMINAL HYSTERECTOMY  08/23/2012   Procedure: HYSTERECTOMY ABDOMINAL;  Surgeon: Alvino Chapel, MD;  Location: [facility];  Service: Gynecology;  Laterality: N/A;  Perineal biopsies  . APPENDECTOMY  AGE 67  . BREAST LUMPECTOMY  2005   RIGHT  . LYMPHADENECTOMY  08/23/2012   Procedure: LYMPHADENECTOMY;  Surgeon: Alvino Chapel, MD;  Location: [facility];  Service: Gynecology;;  Pelvic and para-aortic  . OMENTECTOMY  08/23/2012   Procedure: OMENTECTOMY;  Surgeon: Alvino Chapel, MD;  Location: [facility];   Service: Gynecology;;  . SALPINGOOPHORECTOMY  08/23/2012   Procedure: SALPINGO OOPHORECTOMY;  Surgeon: Alvino Chapel, MD;  Location: [facility];  Service: Gynecology;  Laterality: Bilateral;    No current outpatient medications on file.   No current facility-administered medications for this visit.     Social History   Socioeconomic History  . Marital status: Married    Spouse name: Not on file  . Number of children: Not on file  . Years of education: Not on file  . Highest education level: Not on file  Occupational History  . Not on file  Social Needs  . Financial resource strain: Not on file  . Food insecurity:    Worry: Not on file    Inability: Not on file  . Transportation needs:    Medical: Not on file    Non-medical: Not on file  Tobacco Use  . Smoking status: Never Smoker  . Smokeless tobacco: Never Used  Substance and Sexual Activity  . Alcohol use: Yes    Comment: RARE MAYBE 2X MONTH  . Drug use: No  . Sexual activity: Yes  Lifestyle  . Physical activity:    Days per week: Not on file    Minutes per session: Not on file  . Stress: Not on file  Relationships  . Social connections:    Talks on phone: Not on file    Gets together: Not on file    Attends religious service: Not on file    Active member of club or organization: Not on file    Attends meetings of clubs or organizations: Not on file    Relationship status: Not on file  . Intimate partner violence:    Fear of current or ex partner: Not on file    Emotionally abused: Not on file    Physically abused: Not on file    Forced sexual activity: Not on file  Other Topics Concern  . Not on file  Social History Narrative  . Not on file    Family History  Problem Relation Age of Onset  . Cancer Mother        UTERINE AGE 44  . Cancer Sister        BREAST AGE 32  . Cancer Maternal Aunt        Ovarian cancer      Marti Sleigh, MD 09/13/2018, 12:10 PM

## 2018-09-23 ENCOUNTER — Encounter: Payer: Medicare Other | Attending: Psychology | Admitting: Psychology

## 2018-09-23 DIAGNOSIS — F09 Unspecified mental disorder due to known physiological condition: Secondary | ICD-10-CM | POA: Diagnosis not present

## 2018-10-17 ENCOUNTER — Encounter: Payer: Self-pay | Admitting: Psychology

## 2018-10-17 ENCOUNTER — Encounter: Payer: Medicare Other | Attending: Psychology | Admitting: Psychology

## 2018-10-17 ENCOUNTER — Encounter (HOSPITAL_BASED_OUTPATIENT_CLINIC_OR_DEPARTMENT_OTHER): Payer: Medicare Other | Admitting: Psychology

## 2018-10-17 DIAGNOSIS — F09 Unspecified mental disorder due to known physiological condition: Secondary | ICD-10-CM | POA: Insufficient documentation

## 2018-10-17 DIAGNOSIS — C50919 Malignant neoplasm of unspecified site of unspecified female breast: Secondary | ICD-10-CM

## 2018-10-17 NOTE — Progress Notes (Signed)
Neuropsychological Consultation   Patient:   Lauren Calhoun   DOB:   1951/08/22  MR Number:  [id number]  Location:  [facility] [place], [place] [id number] [place] [id number] Dept: [phone]           Date of Service:   09/23/2018  Start Time:   9 AM End Time:   10 AM  Provider/Observer:  Ilean Skill, Psy.D.       Clinical Neuropsychologist       Billing Code/Service: Neurobehavioral status exam  Reason for Service:   Lauren Calhoun is a 68 year old female referred by Emeline General, MD for neuropsychological evaluation.  The patient reports that she primarily was referred for this appointment due to reports from her husband that the patient has been having more difficulty with mixing up words and not remembering things.  The patient reports that the primary reason for this evaluation is to establish a baseline state for cognitive and neuropsychological functioning.  The patient reports that she had some long-term residual effects of chemotherapy following diagnosis and treatment of breast cancer.  The patient describes "chemo brain" and reports that she had PTSD symptoms due to the diagnosis of her breast cancer.  The patient reports that after chemotherapy she had a lot of cognitive changes including geographic disorientation, naming deficits, memory recall etc.  The patient reports that she noticed symptoms persisting for about 3 years but eventually she felt like she has returned to baseline.  The patient does report that she lost a lot of memories of the past including events that she experienced but does not recall related to her children and her husband.  The patient reports that she does continue to have changes in her sense of smell and has infrequent visual memories from the things that she cannot generally recall.  The patient was diagnosed with breast cancer at  age 63 and had a lumpectomy and chemotherapy as well as radiation (6 weeks-30 treatments) the patient is also been diagnosed with low malignancy probability ovarian cancer in 2013.  The patient reports that there is been a significant change in her overall lifestyle and her life is now much more relaxed.  The patient reports that she will have some visual recall of some of past memories that she is not able to recall spontaneously.  She does acknowledge some continued changes in her expressive language including difficulties with twisting and turning her words.  The patient reports that she is reading and writing a lot and that she has significantly improved in these functions more recently.  The patient does feel like she does continue to have some PTSD symptoms from her diagnosis and treatment of her breast cancer.  Current Status:  The patient describes some mild expressive language changes and some continued memory loss of past events but feels like overall she has generally returned to her baseline level of functioning.  She does report that there is a lot of anxiety and stress associated with her treatment and diagnosis of breast cancer.  The patient reports that the majority of her residual cognitive deficits that she experienced for the first 3 years after her chemotherapy appear to have returned back to baseline.  Reliability of Information: The information is derived from 1 hour face-to-face clinical interview as well as review of available medical records.  Behavioral Observation: Lauren Calhoun  presents as a 68 y.o.-year-old Right Caucasian  Female who appeared her stated age. her dress was Appropriate and she was Well Groomed and her manners were Appropriate to the situation.  her participation was indicative of Appropriate and Attentive behaviors.  There were not any physical disabilities noted.  she displayed an appropriate level of cooperation and motivation.     Interactions:    Active  Appropriate and Attentive  Attention:   within normal limits and attention span and concentration were age appropriate  Memory:   within normal limits; recent and remote memory intact  Visuo-spatial:  within normal limits  Speech (Volume):  normal  Speech:   normal; normal  Thought Process:  Coherent and Relevant  Though Content:  WNL; not suicidal and not homicidal  Orientation:   person, place, time/date and situation  Judgment:   Good  Planning:   Good  Affect:    Anxious  Mood:    Anxious  Insight:   Good  Intelligence:   high  Marital Status/Living: The patient reports that she was born in [place] and grew up with 3 siblings.  Her mother's pregnancy with her was described as normal and she had no significant major childhood illnesses other than mumps and measles.  Walking and talking were achieved at the appropriate time.  The patient has been married for 47 years.  Current Employment: The patient is retired and retired in March 2019.  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  Hobbies and interests include reading, walking, and yoga.  Substance Use:  No concerns of substance abuse are reported.    Education:   Engineering geologist History:   Past Medical History:  Diagnosis Date  . Cancer (HCC)    Breast,sential node  . Chronic kidney disease    kidney stone now ct scan  . Tear of meniscus of knee joint    presently has a tear left knee        Abuse/Trauma History: But the patient does describe her diagnoses of both breast cancer and ovarian cancer is very traumatic and the patient continues to have some residual emotional impact of her diagnosis and treatment for breast cancer.  Psychiatric History:  The patient denies any significant psychiatric history other than the effects of her medical diagnoses.  Family Med/Psych History:  Family  History  Problem Relation Age of Onset  . Cancer Mother        UTERINE AGE 52  . Cancer Sister        BREAST AGE 83  . Cancer Maternal Aunt        Ovarian cancer    Risk of Suicide/Violence: virtually non-existent   Impression/DX:  Lauren Calhoun is a 68 year old female referred by Emeline General, MD for neuropsychological evaluation.  The patient reports that she primarily was referred for this appointment due to reports from her husband that the patient has been having more difficulty with mixing up words and not remembering things.  The patient reports that the primary reason for this evaluation is to establish a baseline state for cognitive and neuropsychological functioning.  The patient reports that she had some long-term residual effects of chemotherapy following diagnosis and treatment of breast cancer.  The patient describes "chemo brain" and reports that she had PTSD symptoms due to the diagnosis of her breast cancer.  The patient reports that after chemotherapy she had a lot of cognitive changes  including geographic disorientation, naming deficits, memory recall etc.  The patient reports that she noticed symptoms persisting for about 3 years but eventually she felt like she has returned to baseline.  The patient does report that she lost a lot of memories of the past including events that she experienced but does not recall related to her children and her husband.  The patient reports that she does continue to have changes in her sense of smell and has infrequent visual memories from the things that she cannot generally recall.  The patient was diagnosed with breast cancer at age 69 and had a lumpectomy and chemotherapy as well as radiation (6 weeks-30 treatments) the patient is also been diagnosed with low malignancy probability ovarian cancer in 2013.  The patient reports that there is been a significant change in her overall lifestyle and her life is now much more relaxed.  The patient  reports that she will have some visual recall of some of past memories that she is not able to recall spontaneously.  She does acknowledge some continued changes in her expressive language including difficulties with twisting and turning her words.  The patient reports that she is reading and writing a lot and that she has significantly improved in these functions more recently.  The patient does feel like she does continue to have some PTSD symptoms from her diagnosis and treatment of her breast cancer.  The patient describes some mild expressive language changes and some continued memory loss of past events but feels like overall she has generally returned to her baseline level of functioning.  She does report that there is a lot of anxiety and stress associated with her treatment and diagnosis of breast cancer.  The patient reports that the majority of her residual cognitive deficits that she experienced for the first 3 years after her chemotherapy appear to have returned back to baseline.   Disposition/Plan:  The patient has been set up for formal neuropsychological testing.  We will administer both the Wechsler Adult Intelligence Scale as well as the Wechsler Memory Scale.  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

## 2018-10-17 NOTE — Progress Notes (Signed)
Patient:  Lauren Calhoun   DOB: 1950-10-09  MR Number: [id number]  Location: [facility] FOR PAIN AND REHABILITATIVE MEDICINE [facility] PHYSICAL MEDICINE AND REHABILITATION [place], [id number] [place] [id number] Dept: [phone]  Start: 4 PM End: 5 PM  Provider/Observer:     Edgardo Roys PsyD  Chief Complaint:      Chief Complaint  Patient presents with  . Memory Loss  . Anxiety    Reason For Service:      Lauren Calhoun is a 68 year old female referred by Emeline General, MD for neuropsychological evaluation.  The patient reports that she primarily was referred for this appointment due to reports from her husband that the patient has been having more difficulty with mixing up words and not remembering things.  The patient reports that the primary reason for this evaluation is to establish a baseline state for cognitive and neuropsychological functioning.  The patient reports that she had some long-term residual effects of chemotherapy following diagnosis and treatment of breast cancer.  The patient describes "chemo brain" and reports that she had PTSD symptoms due to the diagnosis of her breast cancer.  The patient reports that after chemotherapy she had a lot of cognitive changes including geographic disorientation, naming deficits, memory recall etc.  The patient reports that she noticed symptoms persisting for about 3 years but eventually she felt like she has returned to baseline.  The patient does report that she lost a lot of memories of the past including events that she experienced but does not recall related to her children and her husband.  The patient reports that she does continue to have changes in her sense of smell and has infrequent visual memories from the things that she cannot generally recall.  The patient was diagnosed with breast cancer at age 64 and had a lumpectomy and chemotherapy as well as radiation (6 weeks-30 treatments)  the patient is also been diagnosed with low malignancy probability ovarian cancer in 2013.  The patient reports that there is been a significant change in her overall lifestyle and her life is now much more relaxed.  The patient reports that she will have some visual recall of some of past memories that she is not able to recall spontaneously.  She does acknowledge some continued changes in her expressive language including difficulties with twisting and turning her words.  The patient reports that she is reading and writing a lot and that she has significantly improved in these functions more recently.  The patient does feel like she does continue to have some PTSD symptoms from her diagnosis and treatment of her breast cancer.  Testing Administered:  The patient was administered the Wechsler Adult Intelligence Scale-IV as well as the Wechsler Memory Scale for older adults.  The administration and scoring took approximately 4 hours.  It was administered by Dr. Darol Destine.  Participation Level:   Active  Participation Quality:  Appropriate and Attentive      Behavioral Observation:  Well Groomed, Alert, and Appropriate.   Below are the behavioral observations derived by Dr. Darol Destine during the test administrations.  Patient was on time to her 8:00am testing appointment. She was administered the Wechsler Adult Intelligence Scale, 4th Edition and Wechsler Memory Scale, 4th Edition, Older Adult Battery. The evaluation lasted around 180 minutes. Herparticipation was indicative of appropriate and redirectablebehaviors. She was appropriately dressed and well groomed. Shedisplayed an appropriatelevel of cooperation and motivation. Mood was slightly anxious but euthymic  overall. Affect was appropriate and congruent with mood. She appeared somewhat critical of herself and made several self-deprecating remarks about her abilities. Thought processes appeared linear and goal directed.   Test  Results:   Initially, the patient was administered the Wechsler Adult Intelligence Scale-IV.  Below are the formal scales for composite index scores.  Composite Score Summary  Scale Sum of Scaled Scores Composite Score Percentile Rank 95% Conf. Interval Qualitative Description  Verbal Comprehension 42 VCI 122 93 115-127 Superior  Perceptual Reasoning 34 PRI 107 68 100-113 Average  Working Memory 24 WMI 111 77 104-117 High Average  Processing Speed 21 PSI 102 55 93-110 Average  Full Scale 121 FSIQ 114 82 110-118 High Average  General Ability 76 GAI 117 87 112-121 High Average   The patient produced a full scale IQ score of 114 which falls at the 82nd percentile and is in the high average range of functioning.  The patient also produced a global abilities index score of 117 which falls at the 87th percentile and also is in the high average range.  There was considerable variability within composite index scores.  The patient produced a verbal comprehension index score of 122 which falls at the 93rd percentile and in the superior range of functioning.  The patient produced a perceptual reasoning index score of 107 which is in the average range and at the 68th percentile.  While this performance is below her verbal comprehension score it is still in the average range.  The patient produced a working memory index score of 111 which falls at the 77th percentile and is in the high average range.  The patient produced a processing speed index score of 102 which falls at the 55th percentile and is in the average range.  This was her lowest global area of functioning but still was in the average range.  Given her scores on verbal comprehension and working memory this would produce premorbid estimates of functioning much higher than 102 is she achieved on the processing speed index score.  This does suggest that there is some general reduction in overall processing speed.   Verbal Comprehension Subtests  Summary  Subtest Raw Score Scaled Score Percentile Rank Reference Group Scaled Score SEM  Similarities 32 14 91 14 1.04  Vocabulary 55 17 99 18 0.67  Information 16 11 63 12 0.73  (Comprehension) 31 14 91 14 1.08    The patient produced a verbal comprehension index score of 122 which falls at the 93rd percentile and is in the superior range of functioning.  Individual subtest making up this measure shows that the patient did quite well and in the high average to superior range of functioning with regard to verbal reasoning and problem-solving, general vocabulary skills as well as her social judgment and comprehension.  The patient produced an average score with regard to her general fund of information and this likely reflects her specific areas of interest rather than any indication of general loss of knowledge.   Perceptual Reasoning Subtests Summary  Subtest Raw Score Scaled Score Percentile Rank Reference Group Scaled Score SEM  Block Design 41 12 75 9 1.08  Matrix Reasoning 17 12 75 9 0.90  Visual Puzzles 12 10 50 8 0.85  (Figure Weights) 17 14 91 11 0.95  (Picture Completion) 12 11 63 9 1.16    The patient produced a perceptual reasoning index score of 107 which falls at the 68 percentile and is at the upper end of the  average range.  Individual items making up this index score shows that she did quite well on measures assessing visual problem solving and estimation.  The patient produced average performance with regard to visual spatial analysis and organization, visual reasoning, and the ability to identify visual anomalies within an overall gestalt.   Working Doctor, general practice Raw Score Scaled Score Percentile Rank Reference Group Scaled Score SEM  Digit Span 31 13 84 11 0.79  Arithmetic 15 11 63 11 0.99  (Letter-Number Seq.) 26 19 99.9 18 1.04    The patient produced a working memory index score of 111 which falls at the 77th percentile and is in the high  average range.  Individual subtest making up this measure shows that she does exceptionally well on very demanding auditory encoding tasks and multi processing task.  This is an exceptionally good performance on auditory encoding and multi processing measures.  The patient's ability to perform mathematical equations while also retaining information was not is superior in his performance but was still in the average range.  Processing Speed Subtests Summary  Subtest Raw Score Scaled Score Percentile Rank Reference Group Scaled Score SEM  Symbol Search 22 8 25 6  1.31  Coding 71 13 84 10 0.99  (Cancellation) 31 8 25 7  1.34   The patient produced a processing speed index score of 102 which falls at the 55th percentile and is in the average range.  Individual subtest making up this measure show low average performances with regard to visual scanning, visual searching and overall information processing speed.  The patient did do better on items where she scanned horizontally versus items vertically.  The patient was then administered the [employer] Scale-for older addition.  Below are the ability-memory analysis scores for current functioning.  On these measures we used the patient's global abilities index score of 117 to produce a predicted score on each of the primary memory indices.    ABILITY-MEMORY ANALYSIS  Ability Score:  GAI: 117 Date of Testing:  WAIS-IV; WMS-IV 2018/10/17  Predicted Difference Method   Index Predicted WMS-IV Index Score Actual WMS-IV Index Score Difference Critical Value  Significant Difference Y/N Base Rate  Auditory Memory 109 118 -9 9.22 N   Visual Memory 110 100 10 8.25 Y 20-25%  Immediate Memory 111 117 -6 10.13 N   Delayed Memory 110 108 2 11.43 N   Statistical significance (critical value) at the .01 level.   The patient produced an immediate memory index score of 117 which falls in the high average range relative to her age-matched peers group.   This performance is slightly higher than predicted levels based on her global abilities index score.  The patient's initial auditory memory was a 118 and was also quite good and higher than predicted levels based on her global abilities index score.  The patient's visual memory was a 110 and was significantly below what was predicted based on her global abilities index score.  This represents a significant difference between auditory memory and visual memory where she produced a 118 index score on her auditory memory and a 110 index score for visual memory.  This is a statistically significant difference.  The patient's delayed memory were both auditory and visual information are combined showed consistent performance between predicted and achieve levels.  However, while not significantly impaired the patient's delayed memory for visual information was significantly below her excellent performance with regard to delayed auditory memory.   Summary of Results:  Overall, the patient's performance on a wide range of neuropsychological and cognitive measures do show that she is generally functioning in the high average to superior range of overall cognitive functioning.  The patient's verbal comprehension abilities, auditory encoding and working memory, and Office manager and learning were all in the high average to superior range.  The patient did have some relative difficulties with regard to overall information processing speed and some visual spatial weaknesses as well as some mild relative weaknesses with regard to visual memory and learning.  However, when the patient's performances are compared to a normative population these relative deficits are still only in the average range and do not indicate any significant cognitive deficits.  Impression/Diagnosis:   Overall, the patient's performance is quite good generally.  She produced a global abilities index and full-scale IQ score in the high average range.   All of the patient's performances were in the average to superior range of functioning and there were no areas of significant cognitive deficits relative to a normative population.  However, there was significant variability within subtest performance and within various areas of cognitive functioning.  For example the patient's verbal comprehension scores, her auditory encoding, and her verbal/auditory memory were all in the high average to superior range of functioning.  The patient did exceptionally well on the auditory multi processing task.  This was in sharp contrast to her relative performance on information processing abilities and visual memory abilities both immediate and delayed.  These performances do indicate that there may very well be some residual effects of her chemotherapy as the areas of strength and weaknesses would not be explained simply by the psychological impacts of her diagnosis of cancer and treatment for cancer.  Anxiety and depression tends to affect auditory and verbal information much more than visual cognitive functioning and visual memory.  This is the opposite pattern we see with patient.  As far as recommendations, I do think the patient is doing quite well with regard to overall cognitive functioning and while there are some relative weaknesses identified compared to her on individual excellent performance on other measures she does appear to be doing fairly well from a cognitive standpoint.  I do think that she is likely having some residual effects of the psychological impact this is all had on her as well and this likely should be the primary area of focus as the residual cognitive deficits are likely not to change over time now that it is been some many years since the initial chemotherapy treatments.  Diagnosis:    Axis I: Cognitive dysfunction, acquired   Ilean Skill, Psy.D. Neuropsychologist          WAIS-IV Wechsler Adult Intelligence Scale-Fourth  Edition Score Report   Examinee Name Laconda Basich  Date of Report 2018/10/17  Justice Rocher [id number]  Years of Education   Date of Birth [birth year]  Primary Language   Gender Female  Handedness   Race/Ethnicity   Examiner Name Ilean Skill  Date of Testing 2018/10/17  Age at Testing 54 years 8 months  Retest? No   Comments: Composite Score Summary  Scale Sum of Scaled Scores Composite Score Percentile Rank 95% Conf. Interval Qualitative Description  Verbal Comprehension 42 VCI 122 93 115-127 Superior  Perceptual Reasoning 34 PRI 107 68 100-113 Average  Working Memory 24 WMI 111 77 104-117 High Average  Processing Speed 21 PSI 102 55 93-110 Average  Full Scale 121 FSIQ 114 82 110-118 High Average  General Ability 76  GAI 117 87 112-121 High Average  Confidence Intervals are based on the Overall Average SEMs. The [facility] is an optional composite summary score that is less sensitive to the influence of working memory and processing speed. Because working memory and processing speed are vital to a comprehensive evaluation of cognitive ability, it should be noted that the [facility] does not have the breadth of construct coverage as the FSIQ.   Note. The vertical bars represent the standard error of measurement (SEM). SEM values are based on the examinee's age.   ANALYSIS   Index Level Discrepancy Comparisons  Comparison Score 1 Score 2 Difference Critical Value .05 Significant Difference Y/N Base Rate by Overall Sample  VCI - PRI 122 107 15 8.31 Y 13.0  VCI - WMI 122 111 11 8.82 Y 19.7  VCI - PSI 122 102 20 10.19 Y 11.0  PRI - WMI 107 111 -4 9.74 N 39.5  PRI - PSI 107 102 5 11.00 N 37.0  WMI - PSI 111 102 9 11.38 N 26.2  FSIQ - GAI 114 117 -3 3.08 N 30.3  Base Rate by Overall Sample. Statistical significance (critical value) at the .05 level.  Verbal Comprehension Subtests Summary  Subtest Raw Score Scaled Score Percentile Rank Reference Group Scaled Score SEM  Similarities 32 14  91 14 1.04  Vocabulary 55 17 99 18 0.67  Information 16 11 63 12 0.73  (Comprehension) 31 14 91 14 1.08   Perceptual Reasoning Subtests Summary  Subtest Raw Score Scaled Score Percentile Rank Reference Group Scaled Score SEM  Block Design 41 12 75 9 1.08  Matrix Reasoning 17 12 75 9 0.90  Visual Puzzles 12 10 50 8 0.85  (Figure Weights) 17 14 91 11 0.95  (Picture Completion) 12 11 63 9 1.16   Working Doctor, general practice Raw Score Scaled Score Percentile Rank Reference Group Scaled Score SEM  Digit Span 31 13 84 11 0.79  Arithmetic 15 11 63 11 0.99  (Letter-Number Seq.) 26 19 99.9 18 1.04   Processing Speed Subtests Summary  Subtest Raw Score Scaled Score Percentile Rank Reference Group Scaled Score SEM  Symbol Search 22 8 25 6  1.31  Coding 71 13 84 10 0.99  (Cancellation) 31 8 25 7  1.34   Subtest Level Discrepancy Comparisons  Subtest Comparison Score 1 Score 2 Difference Critical Value .05 Significant Difference Y/N Base Rate  Digit Span - Arithmetic 13 11 2  2.57 N 29.00  Symbol Search - Coding 8 13 -5 3.41 Y 3.60  Statistical significance (critical value) at the .05 level.     DETERMINING STRENGTHS AND WEAKNESSES   Differences Between Subtest and Overall Mean of Subtest Scores  Subtest Subtest Scaled Score Mean Scaled Score Difference Critical Value .05 Strength or Weakness Base Rate  Block Design 12 12.10 -0.10 2.85  >25%  Similarities 14 12.10 1.90 2.82  >25%  Digit Span 13 12.10 0.90 2.22  >25%  Matrix Reasoning 12 12.10 -0.10 2.54  >25%  Vocabulary 17 12.10 4.90 2.03 S 1-2%  Arithmetic 11 12.10 -1.10 2.73  >25%  Symbol Search 8 12.10 -4.10 3.42 W 5-10%  Visual Puzzles 10 12.10 -2.10 2.71  >25%  Information 11 12.10 -1.10 2.19  >25%  Coding 13 12.10 0.90 2.97  >25%  Overall: Mean = 12.10, Scatter =  9, Base rate =  16.7 Base Rate for Intersubtest Scatter is reported for 10 Subtests. Statistical significance (critical value) at the .05  level.  PROCESS ANALYSIS   Perceptual Reasoning Process Score Summary  Process Score Raw Score Scaled Score Percentile Rank SEM  Block Design No Time Bonus 36 11 63 1.16    Working Memory Process Score Summary  Process Score Raw Score Scaled Score Percentile Rank Base Rate SEM  Digit Span Forward 12 13 84 -- 1.37  Digit Span Backward 8 10 50 -- 1.41  Digit Span Sequencing 11 15 95 -- 1.31  Longest Digit Span Forward 7 -- -- 49.5 --  Longest Digit Span Backward 4 -- -- 76.5 --  Longest Digit Span Sequence 7 -- -- 15.5 --  Longest Letter-Number Sequence 8 -- -- 3.0 --    Process Level Discrepancy Comparisons  Process Comparison Score 1 Score 2 Difference Critical Value .05 Significant Difference Y/N Base Rate  Block Design - Block Design No Time Bonus 12 11 1  3.08 N 21.5  Digit Span Forward - Digit Span Backward 13 10 3  3.65 N 17.4  Digit Span Forward - Digit Span Sequencing 13 15 -2 3.60 N 31.7  Digit Span Backward - Digit Span Sequencing 10 15 -5 3.56 Y 6.1  Longest Digit Span Forward - Longest Digit Span Backward 7 4 3  -- -- 31.5  Longest Digit Span Forward - Longest Digit Span Sequence 7 7 0 -- --   Longest Digit Span Backward - Longest Digit Span Sequence 4 7 -3 -- -- 13.5  Statistical significance (critical value) at the .05 level.   Raw Scores  Subtest Score Range Raw Score  Process Score Range Raw Score  Block Design 0-66 41  Block Design No Time Bonus 0-48 36  Similarities 0-36 32  Digit Span Forward 0-16 12  Digit Span 0-48 31  Digit Span Backward 0-16 8  Matrix Reasoning 0-26 17  Digit Span Sequencing 0-16 11  Vocabulary 0-57 55  Longest Digit Span Forward 0, 2-9 7  Arithmetic 0-22 15  Longest Digit Span Backward 0, 2-8 4  Symbol Search 0-60 22  Longest Digit Span Sequence 0, 2-9 7  Visual Puzzles 0-26 12  Longest Letter-Number Seq. 0, 2-8 8  Information 0-26 16      Coding 0-135 71      Letter-Number Seq. 0-30 26      Figure Weights 0-27 17       Comprehension 0-36 31      Cancellation 0-72 31      Picture Completion 0-24 12        End of Report  WMS-IV Wechsler Memory Scale-Fourth Edition Score Report   Examinee Name Lauren Calhoun  Date of Report 2018/10/17  Kingsley Spittle ID [id number]  Years of Education 24  Date of Birth 1951-06-13  Home Language   Gender Female  Handedness Not Specified  Race/Ethnicity   Examiner Name Ilean Skill  Date of Testing 2018/10/17  Age at Testing 31 years 8 months  Retest? No   Comments: Brief Cognitive Status Exam Classification  Age Years of Education Raw Score Classification Level Base Rate  67 years 8 months 16 56 Average 100.0    Index Score Summary  Index Sum of Scaled Scores Index Score Percentile Rank 95% Confidence Interval Qualitative Descriptor  Auditory Memory (AMI) 52 118 88 111-123 High Average  Visual Memory (VMI) 20 100 50 95-105 Average  Immediate Memory (IMI) 38 117 87 110-122 High Average  Delayed Memory (DMI) 34 108 70 100-115 Average  e vertical bars represent the standard error of measurement (SEM).   Primary Subtest Scaled Score  Summary  Subtest Domain Raw Score Scaled Score Percentile Rank  Logical Memory I AM 40 13 84  Logical Memory II AM 23 12 75  Verbal Paired Associates I AM 28 13 84  Verbal Paired Associates II AM 9 14 91  Visual Reproduction I VM 37 12 75  Visual Reproduction II VM 15 8 25   Symbol Span VWM 17 9 37   Primary Subtest Scaled Score Profile  PROCESS SCORE CONVERSIONS  Auditory Memory Process Score Summary  Process Score Raw Score Scaled Score Percentile Rank Cumulative Percentage (Base Rate)  LM II Recognition 18 - - 26-50%  VPA II Recognition 29 - - 51-75%   Visual Memory Process Score Summary  Process Score Raw Score Scaled Score Percentile Rank Cumulative Percentage (Base Rate)  VR II Recognition 5 - - 51-75%    SUBTEST-LEVEL DIFFERENCES WITHIN INDEXES  Auditory Memory Index  Subtest Scaled Score AMI Mean Score  Difference from Mean Critical Value Base Rate  Logical Memory I 13 13.00 0.00 2.45 >25%  Logical Memory II 12 13.00 -1.00 2.38 >25%  Verbal Paired Associates I 13 13.00 0.00 2.04 >25%  Verbal Paired Associates II 14 13.00 1.00 3.06 >25%  Statistical significance (critical value) at the .05 level.   Immediate Memory Index  Subtest Scaled Score IMI Mean Score Difference from Mean Critical Value Base Rate  Logical Memory I 13 12.67 0.33 2.01 >25%  Verbal Paired Associates I 13 12.67 0.33 1.71 >25%  Visual Reproduction I 12 12.67 -0.67 1.71 >25%  Statistical significance (critical value) at the .05 level.   Delayed Memory Index  Subtest Scaled Score DMI Mean Score Difference from Mean Critical Value Base Rate  Logical Memory II 12 11.33 0.67 2.15 >25%  Verbal Paired Associates II 14 11.33 2.67 2.63 15%  Visual Reproduction II 8 11.33 -3.33 1.77 10-15%  Statistical significance (critical value) at the .05 level.    SUBTEST DISCREPANCY COMPARISON  Comparison Score 1 Score 2 Difference Critical Value Base Rate  Visual Reproduction I - Visual Reproduction II 12 8 4  1.99 17.2  Statistical significance (critical value) at the .05 level.    SUBTEST-LEVEL CONTRAST SCALED SCORES  Logical Memory  Score Score 1 Score 2 Contrast Scaled Score  LM II Recognition vs. Delayed Recall 26-50% 12 14  LM Immediate Recall vs. Delayed Recall 13 12 9    Verbal Paired Associates  Score Score 1 Score 2 Contrast Scaled Score  VPA II Recognition vs. Delayed Recall 51-75% 14 16  VPA Immediate Recall vs. Delayed Recall 13 14 12    Visual Reproduction  Score Score 1 Score 2 Contrast Scaled Score  VR II Recognition vs. Delayed Recall 51-75% 8 7  VR Immediate Recall vs. Delayed Recall 12 8 6     INDEX-LEVEL CONTRAST SCALED SCORES  WMS-IV Indexes  Score Score 1 Score 2 Contrast Scaled Score  Auditory Memory Index vs. Visual Memory Index 118 100 9  Immediate Memory Index vs. Delayed Memory Index 117 108  7    RAW SCORES  Subtest Score Range Adult Score Range Older Adult Raw Score  Brief Cognitive Status Exam 0-58 0-58 56  Logical Memory I 0-50 0-53 40  Logical Memory II 0-50 0-39 23  Verbal Paired Associates I 0-56 0-40 28  Verbal Paired Associates II 0-14 0-10 9  CVLT-II Trials 1-5 5-95 5-95   CVLT-II Long-Delay -5-5 -5-5   Designs I 0-120    Designs II 0-120    Visual Reproduction I 0-43 0-43 37  Visual Reproduction II 0-43 0-43 15  Spatial Addition 0-24    Symbol Span 0-50 0-50 17  Process Score Range Adult Score Range Older Adult Raw Score  LM II Recognition 0-30 0-23 18  VPA II Recognition 0-40 0-30 29  VPA II Word Recall 0-28 0-20   DE I Content 0-48    DE I Spatial 0-24    DE II Content 0-48    DE II Spatial 0-24    DE II Recognition 0-24    VR II Recognition 0-7 0-7 5  VR II Copy 0-43 0-43     ABILITY-MEMORY ANALYSIS  Ability Score:  GAI: 117 Date of Testing:  WAIS-IV; WMS-IV 2018/10/17  Predicted Difference Method   Index Predicted WMS-IV Index Score Actual WMS-IV Index Score Difference Critical Value  Significant Difference Y/N Base Rate  Auditory Memory 109 118 -9 9.22 N   Visual Memory 110 100 10 8.25 Y 20-25%  Immediate Memory 111 117 -6 10.13 N   Delayed Memory 110 108 2 11.43 N   Statistical significance (critical value) at the .01 level.   Contrast Scaled Scores  Score Score 1 Score 2 Contrast Scaled Score  General Ability Index vs. Auditory Memory Index 117 118 13  General Ability Index vs. Visual Memory Index 117 100 8  General Ability Index vs. Immediate Memory Index 117 117 12  General Ability Index vs. Delayed Memory Index 117 108 10  Verbal Comprehension Index vs. Auditory Memory Index 122 118 12  Perceptual Reasoning Index vs. Visual Memory Index 107 100 9  Working Memory Index vs. Auditory Memory Index 111 118 13    End of Report

## 2018-10-17 NOTE — Progress Notes (Signed)
BEHAVIOR OBSERVATIONS: Patient was on time to her 8:00am testing appointment. She was administered the Wechsler Adult Intelligence Scale, 4th Edition and Wechsler Memory Scale, 4th Edition, Older Adult Battery. The evaluation lasted around 180 minutes. Her participation was indicative of appropriate and redirectable behaviors.  She was appropriately dressed and well groomed. She displayed an appropriate level of cooperation and motivation. Mood was slightly anxious but euthymic overall. Affect was appropriate and congruent with mood. She appeared somewhat critical of herself and made several self-deprecating remarks about her abilities. Thought processes appeared linear and goal directed.   Results of the WAIS-IV are as follows:  Composite Score Summary  Scale Sum of Scaled Scores Composite Score Percentile Rank 95% Conf. Interval Qualitative Description  Verbal Comprehension 42 VCI 122 93 115-127 Superior  Perceptual Reasoning 34 PRI 107 68 100-113 Average  Working Memory 24 WMI 111 77 104-117 High Average  Processing Speed 21 PSI 102 55 93-110 Average  Full Scale 121 FSIQ 114 82 110-118 High Average  General Ability 76 GAI 117 87 112-121 High Average   Index Level Discrepancy Comparisons  Comparison Score 1 Score 2 Difference Critical Value .05 Significant Difference Y/N Base Rate by Overall Sample  VCI - PRI 122 107 15 8.31 Y 13.0  VCI - WMI 122 111 11 8.82 Y 19.7  VCI - PSI 122 102 20 10.19 Y 11.0   Differences Between Subtest and Overall Mean of Subtest Scores  Subtest Subtest Scaled Score Mean Scaled Score Difference Critical Value .05 Strength or Weakness Base Rate  Vocabulary 17 12.10 4.90 2.03 S 1-2%  Symbol Search 8 12.10 -4.10 3.42 W 5-10%    Results of the WMS-IV, Older Adult Battery are as follows:   Brief Cognitive Status Exam Classification  Age Years of Education Raw Score Classification Level Base Rate  67 years 8 months 13 56 Average 100.0   Index Score  Summary  Index Sum of Scaled Scores Index Score Percentile Rank 95% Confidence Interval Qualitative Descriptor  Auditory Memory (AMI) 52 118 88 111-123 High Average  Visual Memory (VMI) 20 100 50 95-105 Average  Immediate Memory (IMI) 38 117 87 110-122 High Average  Delayed Memory (DMI) 34 108 70 100-115 Average   Primary Subtest Scaled Score Summary  Subtest Domain Raw Score Scaled Score Percentile Rank  Logical Memory I AM 40 13 84  Logical Memory II AM 23 12 75  Verbal [employer] I AM 28 13 84  Verbal [employer] II AM 9 14 91  Visual Reproduction I VM 37 12 75  Visual Reproduction II VM 15 8 25   Symbol Span VWM 17 9 37

## 2018-10-24 ENCOUNTER — Encounter: Payer: Self-pay | Admitting: Psychology

## 2018-11-04 ENCOUNTER — Telehealth: Payer: Self-pay | Admitting: Internal Medicine

## 2018-11-04 NOTE — Telephone Encounter (Signed)
Spoke with patient she denied any symptoms like a HA, dizziness, SOB, CP. She said if you think she can wait to be evaluated to be seen on Monday then she should wait to be seen until Thursday bc she is going to be out of town on Monday. Patient scheduled an appointment for Thursday.

## 2018-11-04 NOTE — Telephone Encounter (Signed)
Please call pt. We have given her a 10 am appt Monday.

## 2018-11-04 NOTE — Telephone Encounter (Signed)
Patient called and said her blood pressure has been elevated and that she has been light headed for the passed couple of days, Per Dr. Renold Genta she doesn't have any available spots today and said to try and get her schedueled for [place] at 76. Patient then asked if she could speak with Dr Renold Genta and I let her know I would send a message to her because she is busy at the current moment. Patient said best number is [phone]

## 2018-11-10 ENCOUNTER — Ambulatory Visit (INDEPENDENT_AMBULATORY_CARE_PROVIDER_SITE_OTHER): Payer: Medicare Other | Admitting: Internal Medicine

## 2018-11-10 ENCOUNTER — Encounter: Payer: Self-pay | Admitting: Internal Medicine

## 2018-11-10 VITALS — BP 112/80 | HR 54 | Temp 98.7°F | Ht 63.0 in | Wt 136.0 lb

## 2018-11-10 DIAGNOSIS — M65841 Other synovitis and tenosynovitis, right hand: Secondary | ICD-10-CM | POA: Insufficient documentation

## 2018-11-10 DIAGNOSIS — R030 Elevated blood-pressure reading, without diagnosis of hypertension: Secondary | ICD-10-CM | POA: Diagnosis not present

## 2018-11-10 DIAGNOSIS — F411 Generalized anxiety disorder: Secondary | ICD-10-CM | POA: Diagnosis not present

## 2018-11-10 NOTE — Progress Notes (Signed)
   Subjective:    Patient ID: Lauren Calhoun, female    DOB: Aug 08, 1951, 68 y.o.   MRN: [id number]  HPI 68 year old Female in today with blood pressure fluctuations recently and some lightheadedness.  A week or so ago she was on a road trip for some 9 hours.  Felt a little bit of dizziness lasting just a few seconds.  Apparently has had blood pressure elevation of 188/116.  She has been watching her blood pressure and has multiple readings.  She goes to yoga  Does not really want to be on blood pressure medication if it is not necessary.    Review of Systems see above-no significant headache nausea or vomiting    Objective:   Physical Exam Vital signs reviewed today.  Blood pressure is 130/80.  She has no nystagmus.  She is neurologically intact without focal deficits.  Chest clear to auscultation.  Cardiac exam regular rate and rhythm normal S1 and S2.  Extremities without edema.       Assessment & Plan:  Labile blood pressure likely related to dizziness from long road trip.  May have had some benign positional vertigo brought on by road trip.  Plan: She will continue to monitor her blood pressure.  She will let me know if persistently elevated.  No medications prescribed today.  She has physical exam scheduled for June

## 2018-11-17 ENCOUNTER — Encounter: Payer: Medicare Other | Attending: Psychology | Admitting: Psychology

## 2018-11-17 ENCOUNTER — Ambulatory Visit: Payer: Medicare Other | Attending: Internal Medicine | Admitting: Physical Therapy

## 2018-11-17 ENCOUNTER — Other Ambulatory Visit: Payer: Self-pay

## 2018-11-17 DIAGNOSIS — Z483 Aftercare following surgery for neoplasm: Secondary | ICD-10-CM | POA: Diagnosis not present

## 2018-11-17 DIAGNOSIS — F09 Unspecified mental disorder due to known physiological condition: Secondary | ICD-10-CM | POA: Diagnosis not present

## 2018-11-17 DIAGNOSIS — L599 Disorder of the skin and subcutaneous tissue related to radiation, unspecified: Secondary | ICD-10-CM | POA: Insufficient documentation

## 2018-11-17 NOTE — Therapy (Signed)
[place], [place], [address] Phone: [phone]   Fax:  [phone]  Physical Therapy Evaluation  Patient Details  Name: Lauren Calhoun MRN: [id number] Date of Birth: 1951-05-16 Referring Provider (PT): Dr. Renold Genta    Encounter Date: 11/17/2018  PT End of Session - 11/17/18 1224    Visit Number  1    Number of Visits  3    Date for PT Re-Evaluation  12/16/18    PT Start Time  1030   pt arrived late   PT Stop Time  1100    PT Time Calculation (min)  30 min    Activity Tolerance  Patient tolerated treatment well    Behavior During Therapy  [facility] for tasks assessed/performed       Past Medical History:  Diagnosis Date  . Cancer (HCC)    Breast,sential node  . Chronic kidney disease    kidney stone now ct scan  . Tear of meniscus of knee joint    presently has a tear left knee    Past Surgical History:  Procedure Laterality Date  . ABDOMINAL HYSTERECTOMY  08/23/2012   Procedure: HYSTERECTOMY ABDOMINAL;  Surgeon: Alvino Chapel, MD;  Location: [facility];  Service: Gynecology;  Laterality: N/A;  Perineal biopsies  . APPENDECTOMY  AGE 68  . BREAST LUMPECTOMY  2005   RIGHT  . LYMPHADENECTOMY  08/23/2012   Procedure: LYMPHADENECTOMY;  Surgeon: Alvino Chapel, MD;  Location: [facility];  Service: Gynecology;;  Pelvic and para-aortic  . OMENTECTOMY  08/23/2012   Procedure: OMENTECTOMY;  Surgeon: Alvino Chapel, MD;  Location: [facility];  Service: Gynecology;;  . SALPINGOOPHORECTOMY  08/23/2012   Procedure: SALPINGO OOPHORECTOMY;  Surgeon: Alvino Chapel, MD;  Location: [facility];  Service: Gynecology;  Laterality: Bilateral;    There were no vitals filed for this visit.   Subjective Assessment - 11/17/18 1032    Subjective  Pt wants to get a refresher for manual lymph drainage and check of her compression sleeve     Pertinent History  2006 pt had right breast cancer with  lumpectomy sentinel node biopsy chemo and radiation , then 08/23/2012 TAH with 8 inguinal nodes removed from each side, 7 lymph nodes removed from abdominal area. Pt takes no medication     Currently in Pain?  No/denies         [facility] PT Assessment - 11/17/18 0001      Assessment   Medical Diagnosis  right breast cancer     Referring Provider (PT)  Dr. Renold Genta     Onset Date/Surgical Date  01/03/05    Hand Dominance  Right      Precautions   Precautions  None      Balance Screen   Has the patient fallen in the past 6 months  No    Has the patient had a decrease in activity level because of a fear of falling?   No    Is the patient reluctant to leave their home because of a fear of falling?   No      Home Environment   Living Environment  Private residence    Living Arrangements  Spouse/significant other    Available Help at Discharge  Available PRN/intermittently      Prior Function   Level of [place]  Retired    Leisure  walk and do yoga several days a week consistently  Cognition   Overall Cognitive Status  Within Functional Limits for tasks assessed      Observation/Other Assessments   Observations  well healed incisions in axilla and at right breat that are moveable       Posture/Postural Control   Posture/Postural Control  No significant limitations      ROM / Strength   AROM / PROM / Strength  AROM;Strength      AROM   Overall AROM   Within functional limits for tasks performed      Strength   Overall Strength  Within functional limits for tasks performed      Palpation   Palpation comment  no firmness in axilla or right upper extremity         LYMPHEDEMA/ONCOLOGY QUESTIONNAIRE - 11/17/18 1046      Right Upper Extremity Lymphedema   15 cm Proximal to Olecranon Process  28.8 cm    10 cm Proximal to Olecranon Process  26.5 cm    Olecranon Process  22.5 cm    15 cm Proximal to Ulnar Styloid Process  23 cm    10 cm  Proximal to Ulnar Styloid Process  20 cm    Just Proximal to Ulnar Styloid Process  15 cm    Across Hand at [employer]  17.5 cm    At [place] of 2nd Digit  5.5 cm      Left Upper Extremity Lymphedema   15 cm Proximal to Olecranon Process  28.5 cm    10 cm Proximal to Olecranon Process  26 cm    Olecranon Process  22.8 cm    15 cm Proximal to Ulnar Styloid Process  22.5 cm    10 cm Proximal to Ulnar Styloid Process  20 cm    Just Proximal to Ulnar Styloid Process  15 cm    Across Hand at [employer]  17.5 cm    At [place] of 2nd Digit  5.8 cm             Objective measurements completed on examination: See above findings.      [facility] Adult PT Treatment/Exercise - 11/17/18 0001      Self-Care   Self-Care  Other Self-Care Comments    Other Self-Care Comments   applied juzo sleeve and glove and they appear to fit fine                   PT Long Term Goals - 11/17/18 1231      PT LONG TERM GOAL #1   Title  Pt will report she is confident in performing self MLD and use of compression for prophylaxis for lymphedema of right arm     Time  2    Period  Weeks    Status  New             Plan - 11/17/18 1225    Clinical Impression Statement  Pt with remote history of breast cancer treatment and also TAH with inguinal nodes removed that is concerned about developing lymphedema during prolonged flight.  She does not have signs of lymphedema at this time and her current sleeve and glove appear to fit fine but she would like to have new ones since they are several years old.  She also would like to have refresher of  self MLD     History and Personal Factors relevant to plan of care:  history of lumpectomy, sentinel node biopsy chemo, radiation  Clinical Presentation  Stable    Clinical Decision Making  Low    Rehab Potential  Good    Clinical Impairments Affecting Rehab Potential  radiation     PT Frequency  1x / week    PT Duration  2 weeks   may not need  2 visits    PT Treatment/Interventions  ADLs/Self Care Home Management;Orthotic Fit/Training;Manual lymph drainage;Patient/family education    PT Next Visit Plan  Teach self MLD and provide handouts     Consulted and Agree with Plan of Care  Patient       Patient will benefit from skilled therapeutic intervention in order to improve the following deficits and impairments:  Impaired perceived functional ability  Visit Diagnosis: Aftercare following surgery for neoplasm - Plan: PT plan of care cert/re-cert  Disorder of the skin and subcutaneous tissue related to radiation, unspecified - Plan: PT plan of care cert/re-cert     Problem List Patient Active Problem List   Diagnosis Date Noted  . Stenosing tenosynovitis of finger of right hand 11/10/2018  . Acute pain of right knee 12/06/2017  . Tinnitus, bilateral 04/14/2017  . Impacted cerumen of both ears 04/14/2017  . Eustachian tube dysfunction, bilateral 04/14/2017  . Urinary incontinence 01/23/2013  . 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 Breast cancer ([place]) 08/05/2012  . Lateral meniscus derangement 07/20/2012   Donato Heinz. Owens Shark PT  Norwood Levo 11/17/2018, 12:34 PM  [place], [place], [address] Phone: [phone]   Fax:  [phone]  Name: TINNIE KUNIN MRN: [id number] Date of Birth: 1951-08-27

## 2018-11-18 ENCOUNTER — Telehealth: Payer: Self-pay | Admitting: *Deleted

## 2018-11-18 NOTE — Telephone Encounter (Signed)
Ms Oka called and is asking for Dr Ferne Coe notes be sent to her therapist and requested a call back that we received the message.  I have forwarded the VM message to our practice manager Zorita Pang who handles this and I have called Ms Leonhart back and let her know it will be addressed on Monday when Lattie Haw is back in office.

## 2018-11-20 ENCOUNTER — Encounter: Payer: Self-pay | Admitting: Psychology

## 2018-11-20 NOTE — Progress Notes (Signed)
Today I provided feedback to the patient regarding the recent neuropsychological evaluation.  Overall, the patient did quite well on the testing without indications of significant cognitive deficits.  I will remain available if the patient has further questions or there are any indications of further deterioration in cognitive functioning.  Below you will find the summary and interpretation that was utilized for the feedback session today.  The full neuropsychological evaluation can be found on her appointment dated 10/17/2018.  Summary of Results:                        Overall, the patient's performance on a wide range of neuropsychological and cognitive measures do show that she is generally functioning in the high average to superior range of overall cognitive functioning.  The patient's verbal comprehension abilities, auditory encoding and working memory, and Office manager and learning were all in the high average to superior range.  The patient did have some relative difficulties with regard to overall information processing speed and some visual spatial weaknesses as well as some mild relative weaknesses with regard to visual memory and learning.  However, when the patient's performances are compared to a normative population these relative deficits are still only in the average range and do not indicate any significant cognitive deficits.  Impression/Diagnosis:                     Overall, the patient's performance is quite good generally.  She produced a global abilities index and full-scale IQ score in the high average range.  All of the patient's performances were in the average to superior range of functioning and there were no areas of significant cognitive deficits relative to a normative population.  However, there was significant variability within subtest performance and within various areas of cognitive functioning.  For example the patient's verbal comprehension scores, her auditory encoding,  and her verbal/auditory memory were all in the high average to superior range of functioning.  The patient did exceptionally well on the auditory multi processing task.  This was in sharp contrast to her relative performance on information processing abilities and visual memory abilities both immediate and delayed.  These performances do indicate that there may very well be some residual effects of her chemotherapy as the areas of strength and weaknesses would not be explained simply by the psychological impacts of her diagnosis of cancer and treatment for cancer.  Anxiety and depression tends to affect auditory and verbal information much more than visual cognitive functioning and visual memory.  This is the opposite pattern we see with patient.  As far as recommendations, I do think the patient is doing quite well with regard to overall cognitive functioning and while there are some relative weaknesses identified compared to her on individual excellent performance on other measures she does appear to be doing fairly well from a cognitive standpoint.  I do think that she is likely having some residual effects of the psychological impact this is all had on her as well and this likely should be the primary area of focus as the residual cognitive deficits are likely not to change over time now that it is been some many years since the initial chemotherapy treatments.  Diagnosis:                               Axis I: Cognitive dysfunction, acquired   Jenny Reichmann  [place], Psy.D. Neuropsychologist

## 2018-11-30 ENCOUNTER — Encounter: Payer: Self-pay | Admitting: Physical Therapy

## 2018-11-30 ENCOUNTER — Ambulatory Visit: Payer: Medicare Other | Admitting: Physical Therapy

## 2018-11-30 DIAGNOSIS — L599 Disorder of the skin and subcutaneous tissue related to radiation, unspecified: Secondary | ICD-10-CM | POA: Diagnosis not present

## 2018-11-30 DIAGNOSIS — Z483 Aftercare following surgery for neoplasm: Secondary | ICD-10-CM | POA: Diagnosis not present

## 2018-11-30 NOTE — Therapy (Signed)
[place], [place], [address] Phone: [phone]   Fax:  [phone]  Physical Therapy Treatment  Patient Details  Name: Lauren Calhoun MRN: [id number] Date of Birth: [birth year] Referring Provider (PT): Dr. Renold Genta    Encounter Date: 11/30/2018  PT End of Session - 11/30/18 1619    Visit Number  2    Number of Visits  3    Date for PT Re-Evaluation  12/16/18    PT Start Time  1062    PT Stop Time  1430    PT Time Calculation (min)  45 min    Activity Tolerance  Patient tolerated treatment well    Behavior During Therapy  [facility] for tasks assessed/performed       Past Medical History:  Diagnosis Date  . Cancer (HCC)    Breast,sential node  . Chronic kidney disease    kidney stone now ct scan  . Tear of meniscus of knee joint    presently has a tear left knee    Past Surgical History:  Procedure Laterality Date  . ABDOMINAL HYSTERECTOMY  08/23/2012   Procedure: HYSTERECTOMY ABDOMINAL;  Surgeon: Alvino Chapel, MD;  Location: [facility];  Service: Gynecology;  Laterality: N/A;  Perineal biopsies  . APPENDECTOMY  AGE 68  . BREAST LUMPECTOMY  2005   RIGHT  . LYMPHADENECTOMY  08/23/2012   Procedure: LYMPHADENECTOMY;  Surgeon: Alvino Chapel, MD;  Location: [facility];  Service: Gynecology;;  Pelvic and para-aortic  . OMENTECTOMY  08/23/2012   Procedure: OMENTECTOMY;  Surgeon: Alvino Chapel, MD;  Location: [facility];  Service: Gynecology;;  . SALPINGOOPHORECTOMY  08/23/2012   Procedure: SALPINGO OOPHORECTOMY;  Surgeon: Alvino Chapel, MD;  Location: [facility];  Service: Gynecology;  Laterality: Bilateral;    There were no vitals filed for this visit.  Subjective Assessment - 11/30/18 1614    Subjective  Pt wants to have a "tool kit" in case she gets in trouble with lymphedema in her arm.    Pertinent History  2006 pt had right breast cancer with lumpectomy sentinel node  biopsy chemo and radiation , then 08/23/2012 TAH with 8 inguinal nodes removed from each side, 7 lymph nodes removed from abdominal area. Pt takes no medication     Currently in Pain?  No/denies                       [facility] Adult PT Treatment/Exercise - 11/30/18 0001      Manual Therapy   Manual Therapy  Manual Lymphatic Drainage (MLD)    Manual therapy comments  tool kit: diaphragmatic breaths, skin stretch to center or back of body, go slowly, stay above the waist  answered all patient questions     Manual Lymphatic Drainage (MLD)  reviewed and performed: diaphragmatic breaths, short neck, shouder circles going to back, then to sidelying for lateral trunk to back  with hand over hand instruction and verbal cues, then did posterior interaxillary anastamosis and back              PT Education - 11/30/18 1618    Education Details  self manual lymph drainage     Person(s) Educated  Patient    Methods  Explanation;Demonstration;Tactile cues;Verbal cues    Comprehension  Verbalized understanding;Returned demonstration          PT Long Term Goals - 11/30/18 1621      PT LONG TERM GOAL #1  Title  Pt will report she is confident in performing self MLD and use of compression for prophylaxis for lymphedema of right arm     Status  Achieved            Plan - 11/30/18 1619    Clinical Impression Statement  Pt able to do self manual lymph draiange and has a good understanding about how to care for herself and monitor her lymph system.  She felt that all her questions were answered     Rehab Potential  Good    Clinical Impairments Affecting Rehab Potential  radiation     PT Frequency  1x / week    PT Duration  2 weeks    PT Treatment/Interventions  ADLs/Self Care Home Management;Orthotic Fit/Training;Manual lymph drainage;Patient/family education    PT Next Visit Plan  Discharge        Patient will benefit from skilled therapeutic intervention in order to  improve the following deficits and impairments:  Impaired perceived functional ability  Visit Diagnosis: Aftercare following surgery for neoplasm  Disorder of the skin and subcutaneous tissue related to radiation, unspecified     Problem List Patient Active Problem List   Diagnosis Date Noted  . Stenosing tenosynovitis of finger of right hand 11/10/2018  . Acute pain of right knee 12/06/2017  . Tinnitus, bilateral 04/14/2017  . Impacted cerumen of both ears 04/14/2017  . Eustachian tube dysfunction, bilateral 04/14/2017  . Urinary incontinence 01/23/2013  . 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 Breast cancer ([place]) 08/05/2012  . Lateral meniscus derangement 07/20/2012   PHYSICAL THERAPY DISCHARGE SUMMARY  Visits from Start of Care: 2  Current functional level related to goals / functional outcomes:independent    Remaining deficits: none   Education / Equipment: Self manual lymph drainage  Plan: Patient agrees to discharge.  Patient goals were met. Patient is being discharged due to being pleased with the current functional level.  ?????    Donato Heinz. Owens Shark PT   Norwood Levo 11/30/2018, 4:21 PM  [place], [place], [address] Phone: [phone]   Fax:  [phone]  Name: Lauren Calhoun MRN: [id number] Date of Birth: 1951-06-05

## 2018-12-03 NOTE — Patient Instructions (Signed)
Continue to monitor blood pressure at home and let me know if persistently elevated.  Otherwise we will see you in June for physical exam

## 2018-12-13 ENCOUNTER — Ambulatory Visit: Payer: Medicare Other | Admitting: Psychology

## 2018-12-16 ENCOUNTER — Telehealth: Payer: Self-pay | Admitting: Psychology

## 2018-12-16 ENCOUNTER — Ambulatory Visit: Payer: Medicare Other | Admitting: Psychology

## 2018-12-16 NOTE — Telephone Encounter (Signed)
Dr Sima Matas [phone]

## 2019-03-07 ENCOUNTER — Other Ambulatory Visit: Payer: Medicare Other | Admitting: Internal Medicine

## 2019-03-10 ENCOUNTER — Encounter: Payer: Self-pay | Admitting: Internal Medicine

## 2019-03-10 ENCOUNTER — Ambulatory Visit (INDEPENDENT_AMBULATORY_CARE_PROVIDER_SITE_OTHER): Payer: Medicare Other | Admitting: Internal Medicine

## 2019-03-10 VITALS — BP 110/72 | Temp 97.8°F | Ht 65.0 in | Wt 135.0 lb

## 2019-03-10 DIAGNOSIS — E785 Hyperlipidemia, unspecified: Secondary | ICD-10-CM | POA: Diagnosis not present

## 2019-03-10 DIAGNOSIS — E7800 Pure hypercholesterolemia, unspecified: Secondary | ICD-10-CM

## 2019-03-10 DIAGNOSIS — Z8543 Personal history of malignant neoplasm of ovary: Secondary | ICD-10-CM

## 2019-03-10 DIAGNOSIS — M8580 Other specified disorders of bone density and structure, unspecified site: Secondary | ICD-10-CM

## 2019-03-10 DIAGNOSIS — C50919 Malignant neoplasm of unspecified site of unspecified female breast: Secondary | ICD-10-CM | POA: Diagnosis not present

## 2019-03-10 DIAGNOSIS — L900 Lichen sclerosus et atrophicus: Secondary | ICD-10-CM | POA: Diagnosis not present

## 2019-03-10 DIAGNOSIS — Z0000 Encounter for general adult medical examination without abnormal findings: Secondary | ICD-10-CM | POA: Diagnosis not present

## 2019-03-10 DIAGNOSIS — R413 Other amnesia: Secondary | ICD-10-CM | POA: Diagnosis not present

## 2019-03-10 DIAGNOSIS — Z853 Personal history of malignant neoplasm of breast: Secondary | ICD-10-CM | POA: Diagnosis not present

## 2019-03-10 DIAGNOSIS — C569 Malignant neoplasm of unspecified ovary: Secondary | ICD-10-CM | POA: Diagnosis not present

## 2019-03-10 DIAGNOSIS — D3910 Neoplasm of uncertain behavior of unspecified ovary: Secondary | ICD-10-CM | POA: Diagnosis not present

## 2019-03-10 LAB — POCT URINALYSIS DIPSTICK
Appearance: NEGATIVE
Bilirubin, UA: NEGATIVE
Blood, UA: NEGATIVE
Glucose, UA: NEGATIVE
Ketones, UA: NEGATIVE
Leukocytes, UA: NEGATIVE
Nitrite, UA: NEGATIVE
Odor: NEGATIVE
Protein, UA: NEGATIVE
Spec Grav, UA: 1.015 (ref 1.010–1.025)
Urobilinogen, UA: 0.2 E.U./dL
pH, UA: 6.5 (ref 5.0–8.0)

## 2019-03-10 NOTE — Progress Notes (Signed)
Subjective:    Patient ID: Lauren Calhoun, female    DOB: 1950-11-15, 68 y.o.   MRN: [id number]  HPI 68 year old Female seen for Medicare wellness visit, health maintenance exam and evaluation of medical issues.  History of stage Ic ovarian tumor and history of stage I right breast cancer.  She was referred here by Dr. Jana Hakim for primary care in 2012.  She had a right lumpectomy with sentinel node dissection December 2005 for invasive ductal carcinoma 1.4 cm grade 2 with no definite lymphovascular invasion.  0 out of 1 sentinel lymph nodes involved.  She received Taxol/Herceptin followed by radiation all of which she completed in April 2006.  Tested by Dr. Sima Matas for complaint of mild cognitive deficits but did quite well on testing. Thought perhaps to be related to chemotherapy in the past.  She was found to have bilateral ovarian neoplasms in 2013 with an elevated CEA 125 by Dr. Edwinna Areola.  She underwent total abdominal hysterectomy, BSO, bilateral pelvic lymphadenectomy , periaortic lymphadenectomy, omentectomy, multiple peritoneal biopsies November 2013.  All biopsies were negative for metastatic disease.  Pathology showed low malignant potential tumor with focal areas of microinvasion.  No adjuvant therapy was recommended.  She has been followed regularly since that time.  Ca1 25 returned to normal.  A colonoscopy was done in 2018.  History of osteopenia.  Bone density study 2017 showed lowest T score of -2.3.  She has not wanted to be on bone sparing medication.  History of bladder dysfunction previously seen by Dr. Wendy Poet in 2014.  No known drug allergies.  Right ankle fracture at age 87.  Appendectomy at age 39.  History kidney stones seen on CT scan.  Social history: She is married and has 3 adult daughters.  She is now retired.  Family history: Father died at age 9.  Mother died at age 78 of uterine cancer.  She sister died at age 110 of breast cancer.  One sister  with history of hypertension.  3 adult daughters in good health.    Review of Systems no new complaints     Objective:   Physical Exam Vital signs reviewed.  Skin warm and dry.  Nodes none.  Neck is supple without JVD thyromegaly or carotid bruits.  Chest is clear to auscultation.  Breast without masses.  Abdomen is soft nondistended without hepatosplenomegaly.  Pelvic exam deferred status post extensive pelvic surgery and hysterectomy and unfortunately with ovarian cancer.  No lower extremity edema.  Neuro no focal deficits.  Normal mood and affect.       Assessment & Plan:  Osteopenia-last bone density study was in 2017 and needs to be repeated  History of ovarian cancer followed by GYN oncology  History of breast cancer without evidence of recurrence  Pure hypercholesterolemia with LDL of 120.  Has good HDL of 75.  History of mild leukopenia which is previously been evaluated and thought to be benign neutropenia.  Plan: Would like patient to have bone density study in the near future.  Continue with annual mammogram.  Annual flu vaccine recommended.  Return in 1 year or as needed.  Has not wanted to be on lipid-lowering medication.  Subjective:   Patient presents for Medicare Annual/Subsequent preventive examination.  Review Past Medical/Family/Social: See above   Risk Factors  Current exercise habits: Has lots of yoga Dietary issues discussed: Low-fat low carbohydrate which she already follows  Cardiac risk factors: Mild hyperlipidemia  Depression Screen  (Note:  if answer to either of the following is "Yes", a more complete depression screening is indicated)   Over the past two weeks, have you felt down, depressed or hopeless? No  Over the past two weeks, have you felt little interest or pleasure in doing things? No Have you lost interest or pleasure in daily life? No Do you often feel hopeless? No Do you cry easily over simple problems? No   Activities of Daily  Living  In your present state of health, do you have any difficulty performing the following activities?:   Driving? No  Managing money? No  Feeding yourself? No  Getting from bed to chair? No  Climbing a flight of stairs? No  Preparing food and eating?: No  Bathing or showering? No  Getting dressed: No  Getting to the toilet? No  Using the toilet:No  Moving around from place to place: No  In the past year have you fallen or had a near fall?:No  Are you sexually active? yes Do you have more than one partner? No   Hearing Difficulties: No  Do you often ask people to speak up or repeat themselves? No  Do you experience ringing or noises in your ears? No  Do you have difficulty understanding soft or whispered voices? No  Do you feel that you have a problem with memory? No Do you often misplace items? No    Home Safety:  Do you have a smoke alarm at your residence? Yes Do you have grab bars in the bathroom?  None Do you have throw rugs in your house?  None   Cognitive Testing  Alert? Yes Normal Appearance?Yes  Oriented to person? Yes Place? Yes  Time? Yes  Recall of three objects? Yes  Can perform simple calculations? Yes  Displays appropriate judgment?Yes  Can read the correct time from a watch face?Yes   List the Names of Other Physician/Practitioners you currently use:  See referral list for the physicians patient is currently seeing.     Review of Systems: See above   Objective:     General appearance: Appears stated age  and trim Head: Normocephalic, without obvious abnormality, atraumatic  Eyes: conj clear, EOMi PEERLA  Ears: normal TM's and external ear canals both ears  Nose: Nares normal. Septum midline. Mucosa normal. No drainage or sinus tenderness.  Throat: lips, mucosa, and tongue normal; teeth and gums normal  Neck: no adenopathy, no carotid bruit, no JVD, supple, symmetrical, trachea midline and thyroid not enlarged, symmetric, no  tenderness/mass/nodules  No CVA tenderness.  Lungs: clear to auscultation bilaterally  Breasts: normal appearance, no masses or tenderness Heart: regular rate and rhythm, S1, S2 normal, no murmur, click, rub or gallop  Abdomen: soft, non-tender; bowel sounds normal; no masses, no organomegaly  Musculoskeletal: ROM normal in all joints, no crepitus, no deformity, Normal muscle strengthen. Back  is symmetric, no curvature. Skin: Skin color, texture, turgor normal. No rashes or lesions  Lymph nodes: Cervical, supraclavicular, and axillary nodes normal.  Neurologic: CN 2 -12 Normal, Normal symmetric reflexes. Normal coordination and gait  Psych: Alert & Oriented x 3, Mood appear stable.    Assessment:    Annual wellness medicare exam   Plan:    During the course of the visit the patient was educated and counseled about appropriate screening and preventive services including:   Annual flu vaccine  Annual mammogram  Needs to have updated bone density study     Patient Instructions (the written plan) was  given to the patient.  Medicare Attestation  I have personally reviewed:  The patient's medical and social history  Their use of alcohol, tobacco or illicit drugs  Their current medications and supplements  The patient's functional ability including ADLs,fall risks, home safety risks, cognitive, and hearing and visual impairment  Diet and physical activities  Evidence for depression or mood disorders  The patient's weight, height, BMI, and visual acuity have been recorded in the chart. I have made referrals, counseling, and provided education to the patient based on review of the above and I have provided the patient with a written personalized care plan for preventive services.

## 2019-03-11 LAB — LIPID PANEL
Cholesterol: 212 mg/dL — ABNORMAL HIGH (ref <200)
HDL: 75 mg/dL (ref >=50)
LDL Cholesterol (Calc): 120 mg/dL (calc) — ABNORMAL HIGH
Non-HDL Cholesterol (Calc): 137 mg/dL (calc) — ABNORMAL HIGH (ref <130)
Total CHOL/HDL Ratio: 2.8 (calc) (ref <5.0)
Triglycerides: 77 mg/dL (ref <150)

## 2019-03-11 LAB — TSH: TSH: 1.44 mIU/L (ref 0.40–4.50)

## 2019-03-11 LAB — CBC WITH DIFFERENTIAL/PLATELET
Absolute Monocytes: 270 cells/uL (ref 200–950)
Basophils Absolute: 11 cells/uL (ref 0–200)
Basophils Relative: 0.3 %
Eosinophils Absolute: 70 cells/uL (ref 15–500)
Eosinophils Relative: 1.9 %
HCT: 43.7 % (ref 35.0–45.0)
Hemoglobin: 14.5 g/dL (ref 11.7–15.5)
Lymphs Abs: 1095 cells/uL (ref 850–3900)
MCH: 29.6 pg (ref 27.0–33.0)
MCHC: 33.2 g/dL (ref 32.0–36.0)
MCV: 89.2 fL (ref 80.0–100.0)
MPV: 10.4 fL (ref 7.5–12.5)
Monocytes Relative: 7.3 %
Neutro Abs: 2253 cells/uL (ref 1500–7800)
Neutrophils Relative %: 60.9 %
Platelets: 231 10*3/uL (ref 140–400)
RBC: 4.9 10*6/uL (ref 3.80–5.10)
RDW: 12.6 % (ref 11.0–15.0)
Total Lymphocyte: 29.6 %
WBC: 3.7 10*3/uL — ABNORMAL LOW (ref 3.8–10.8)

## 2019-03-11 LAB — COMPLETE METABOLIC PANEL WITH GFR
AG Ratio: 2 (calc) (ref 1.0–2.5)
ALT: 17 U/L (ref 6–29)
AST: 22 U/L (ref 10–35)
Albumin: 4.5 g/dL (ref 3.6–5.1)
Alkaline phosphatase (APISO): 91 U/L (ref 37–153)
BUN: 15 mg/dL (ref 7–25)
CO2: 26 mmol/L (ref 20–32)
Calcium: 10.1 mg/dL (ref 8.6–10.4)
Chloride: 108 mmol/L (ref 98–110)
Creat: 0.94 mg/dL (ref 0.50–0.99)
GFR, Est African American: 73 mL/min/{1.73_m2} (ref >=60)
GFR, Est Non African American: 63 mL/min/{1.73_m2} (ref >=60)
Globulin: 2.3 g/dL (calc) (ref 1.9–3.7)
Glucose, Bld: 95 mg/dL (ref 65–99)
Potassium: 4.4 mmol/L (ref 3.5–5.3)
Sodium: 142 mmol/L (ref 135–146)
Total Bilirubin: 0.7 mg/dL (ref 0.2–1.2)
Total Protein: 6.8 g/dL (ref 6.1–8.1)

## 2019-03-13 LAB — CA 125: CA 125: 14 U/mL (ref <35)

## 2019-03-13 LAB — CANCER ANTIGEN 27.29: CA 27.29: 27 U/mL (ref <38)

## 2019-03-14 DIAGNOSIS — F4323 Adjustment disorder with mixed anxiety and depressed mood: Secondary | ICD-10-CM | POA: Diagnosis not present

## 2019-04-01 NOTE — Patient Instructions (Signed)
It was a pleasure to see you today.  Have bone density study.  Watch fat in diet.  Return in 1 year or as needed.  Have annual flu vaccine and annual mammogram .

## 2019-04-11 ENCOUNTER — Other Ambulatory Visit: Payer: Self-pay

## 2019-04-11 DIAGNOSIS — Z853 Personal history of malignant neoplasm of breast: Secondary | ICD-10-CM

## 2019-04-11 DIAGNOSIS — M8580 Other specified disorders of bone density and structure, unspecified site: Secondary | ICD-10-CM

## 2019-04-14 DIAGNOSIS — H40013 Open angle with borderline findings, low risk, bilateral: Secondary | ICD-10-CM | POA: Diagnosis not present

## 2019-04-14 DIAGNOSIS — H2513 Age-related nuclear cataract, bilateral: Secondary | ICD-10-CM | POA: Diagnosis not present

## 2019-04-14 DIAGNOSIS — H25013 Cortical age-related cataract, bilateral: Secondary | ICD-10-CM | POA: Diagnosis not present

## 2019-04-14 DIAGNOSIS — H04123 Dry eye syndrome of bilateral lacrimal glands: Secondary | ICD-10-CM | POA: Diagnosis not present

## 2019-04-25 DIAGNOSIS — K1370 Unspecified lesions of oral mucosa: Secondary | ICD-10-CM | POA: Diagnosis not present

## 2019-04-25 DIAGNOSIS — F4323 Adjustment disorder with mixed anxiety and depressed mood: Secondary | ICD-10-CM | POA: Diagnosis not present

## 2019-04-25 DIAGNOSIS — D1039 Benign neoplasm of other parts of mouth: Secondary | ICD-10-CM | POA: Diagnosis not present

## 2019-08-26 DIAGNOSIS — Z1231 Encounter for screening mammogram for malignant neoplasm of breast: Secondary | ICD-10-CM | POA: Diagnosis not present

## 2019-08-26 LAB — HM MAMMOGRAPHY

## 2019-08-29 ENCOUNTER — Encounter: Payer: Self-pay | Admitting: Internal Medicine

## 2020-02-16 DIAGNOSIS — H43813 Vitreous degeneration, bilateral: Secondary | ICD-10-CM | POA: Diagnosis not present

## 2020-02-16 DIAGNOSIS — H04123 Dry eye syndrome of bilateral lacrimal glands: Secondary | ICD-10-CM | POA: Diagnosis not present

## 2020-02-16 DIAGNOSIS — H25813 Combined forms of age-related cataract, bilateral: Secondary | ICD-10-CM | POA: Diagnosis not present

## 2020-03-11 ENCOUNTER — Other Ambulatory Visit: Payer: Self-pay

## 2020-03-11 ENCOUNTER — Other Ambulatory Visit: Payer: Medicare Other | Admitting: Internal Medicine

## 2020-03-11 DIAGNOSIS — R413 Other amnesia: Secondary | ICD-10-CM

## 2020-03-11 DIAGNOSIS — E7800 Pure hypercholesterolemia, unspecified: Secondary | ICD-10-CM

## 2020-03-11 DIAGNOSIS — Z8543 Personal history of malignant neoplasm of ovary: Secondary | ICD-10-CM

## 2020-03-11 DIAGNOSIS — Z0000 Encounter for general adult medical examination without abnormal findings: Secondary | ICD-10-CM

## 2020-03-11 DIAGNOSIS — Z1329 Encounter for screening for other suspected endocrine disorder: Secondary | ICD-10-CM

## 2020-03-11 DIAGNOSIS — M8580 Other specified disorders of bone density and structure, unspecified site: Secondary | ICD-10-CM

## 2020-03-11 DIAGNOSIS — Z853 Personal history of malignant neoplasm of breast: Secondary | ICD-10-CM

## 2020-03-14 ENCOUNTER — Other Ambulatory Visit: Payer: Self-pay

## 2020-03-14 ENCOUNTER — Ambulatory Visit (INDEPENDENT_AMBULATORY_CARE_PROVIDER_SITE_OTHER): Payer: Medicare Other | Admitting: Internal Medicine

## 2020-03-14 ENCOUNTER — Encounter: Payer: Self-pay | Admitting: Internal Medicine

## 2020-03-14 VITALS — BP 110/70 | HR 79 | Ht 65.0 in | Wt 135.0 lb

## 2020-03-14 DIAGNOSIS — Z0000 Encounter for general adult medical examination without abnormal findings: Secondary | ICD-10-CM | POA: Diagnosis not present

## 2020-03-14 DIAGNOSIS — Z8543 Personal history of malignant neoplasm of ovary: Secondary | ICD-10-CM

## 2020-03-14 DIAGNOSIS — M8580 Other specified disorders of bone density and structure, unspecified site: Secondary | ICD-10-CM | POA: Diagnosis not present

## 2020-03-14 DIAGNOSIS — Z853 Personal history of malignant neoplasm of breast: Secondary | ICD-10-CM

## 2020-03-14 DIAGNOSIS — E7800 Pure hypercholesterolemia, unspecified: Secondary | ICD-10-CM

## 2020-03-14 LAB — POCT URINALYSIS DIPSTICK
Appearance: NEGATIVE
Bilirubin, UA: NEGATIVE
Blood, UA: NEGATIVE
Glucose, UA: NEGATIVE
Ketones, UA: NEGATIVE
Leukocytes, UA: NEGATIVE
Nitrite, UA: NEGATIVE
Odor: NEGATIVE
Protein, UA: NEGATIVE
Spec Grav, UA: 1.01 (ref 1.010–1.025)
Urobilinogen, UA: 0.2 E.U./dL
pH, UA: 6.5 (ref 5.0–8.0)

## 2020-03-14 NOTE — Progress Notes (Signed)
Subjective:    Patient ID: Lauren Calhoun, female    DOB: [birth year], 69 y.o.   MRN: [id number]  HPI  69 year old Female for health maintenance exam   She has a history of stage Ic ovarian tumor history of stage I right breast cancer.  She was referred here by Dr. Jana Hakim for primary care in 2012.  She had right lumpectomy with sentinel node dissection December 2005 for invasive ductal carcinoma 1.4 cm grade 2 with no definite lymphovascular invasion.  0 out of 1 sentinel lymph nodes involved.  She received Taxol/Herceptin followed by radiation all of which she completed in April 2006.  Tested by Dr. Sima Matas for complaint of mild cognitive deficits but did well on testing.  Perhaps was related to chemotherapy in the past.  She was found to have bilateral ovarian neoplasms in 2013 with an elevated CEA of 125 by Dr. Edwinna Areola she underwent a total abdominal hysterectomy BSO and bilateral pelvic lymphadenectomy, periaortic lymphadenectomy omentectomy, multiple peritoneal biopsies November 2013.  All biopsies were negative for metastatic disease.  Pathology showed low malignant potential tumor with focal areas of microinvasion.  No adjuvant therapy was recommended.  She has been followed regularly since that time.  CA-125 returned to normal.  Had colonoscopy 2018.  History of osteopenia.  Bone density study ordered.  Last one was in 2017 showing lowest T score of -2.3.  Has not wanted to be on bone sparing medication.  History of bladder dysfunction previously seen by Dr. McDiarmid in 2014  No known drug allergies  Right ankle fracture at age 87.  Appendectomy at age 83.  History of kidney stones seen on CT scan.  Social history: She is married and has 3 adult daughters.  She is now retired.  Family history: Father died at age 24.  Mother died at age 55 of uterine cancer.  Sister died at age 24 of breast cancer.  1 sister with history of hypertension.  3 adult daughters in good  health.    Review of Systems  Respiratory: Negative.   Cardiovascular: Negative.   Gastrointestinal: Negative.   Genitourinary: Negative.   Musculoskeletal: Negative.   Neurological: Negative.   Psychiatric/Behavioral: Negative.        Objective:   Physical Exam Vitals reviewed.  Constitutional:      Appearance: Normal appearance.  HENT:     Head: Normocephalic and atraumatic.     Nose: Nose normal.  Eyes:     General: No scleral icterus.    Extraocular Movements: Extraocular movements intact.     Pupils: Pupils are equal, round, and reactive to light.  Neck:     Vascular: No carotid bruit.  Cardiovascular:     Rate and Rhythm: Normal rate and regular rhythm.     Pulses: Normal pulses.     Heart sounds: Normal heart sounds.  Pulmonary:     Effort: Pulmonary effort is normal. No respiratory distress.     Breath sounds: Normal breath sounds.  Abdominal:     General: Bowel sounds are normal.     Palpations: Abdomen is soft.  Genitourinary:    Comments: Deferred status post TAH/BSO Musculoskeletal:     Cervical back: Neck supple.     Right lower leg: No edema.     Left lower leg: No edema.  Lymphadenopathy:     Cervical: No cervical adenopathy.  Skin:    General: Skin is warm and dry.  Neurological:     General: No  focal deficit present.     Mental Status: She is alert and oriented to person, place, and time.  Psychiatric:        Mood and Affect: Mood normal.        Behavior: Behavior normal.        Thought Content: Thought content normal.        Judgment: Judgment normal.           Assessment & Plan:  Normal health maintenance exam  History of ovarian cancer with no evidence of recurrence  History of breast cancer without evidence of recurrence  Patient requested cancer antigens CA-125 and CA 27.29 and both of these were within normal limits  Pure hypercholesterolemia-mild elevation of LDL at 1 PM.  She has a good HDL of 76.  Continue to  monitor.  History of mild leukopenia previously evaluated and thought to be benign neutropenia.  White blood cell count 3100 and previously was 3700 in 2020 and 3400 in 2020.  Plan: Order placed for bone density study.  Continue annual mammography.  Return in 1 year or as needed.  Subjective:   Patient presents for Medicare Annual/Subsequent preventive examination.  Review Past Medical/Family/Social: See above   Risk Factors  Current exercise habits: Lots of yoga Dietary issues discussed: Low-fat low carbohydrate  Cardiac risk factors: Very mild LDL elevation  Depression Screen  (Note: if answer to either of the following is "Yes", a more complete depression screening is indicated)   Over the past two weeks, have you felt down, depressed or hopeless? No  Over the past two weeks, have you felt little interest or pleasure in doing things? No Have you lost interest or pleasure in daily life? No Do you often feel hopeless? No Do you cry easily over simple problems? No   Activities of Daily Living  In your present state of health, do you have any difficulty performing the following activities?:   Driving? No  Managing money? No  Feeding yourself? No  Getting from bed to chair? No  Climbing a flight of stairs? No  Preparing food and eating?: No  Bathing or showering? No  Getting dressed: No  Getting to the toilet? No  Using the toilet:No  Moving around from place to place: No  In the past year have you fallen or had a near fall?:No  Are you sexually active? yes Do you have more than one partner? No   Hearing Difficulties: No  Do you often ask people to speak up or repeat themselves? No  Do you experience ringing or noises in your ears? No  Do you have difficulty understanding soft or whispered voices? No  Do you feel that you have a problem with memory? No Do you often misplace items? No    Home Safety:  Do you have a smoke alarm at your residence? Yes Do you have  grab bars in the bathroom?  No Do you have throw rugs in your house? Yes  Cognitive Testing  Alert? Yes Normal Appearance?Yes  Oriented to person? Yes Place? Yes  Time? Yes  Recall of three objects? Yes  Can perform simple calculations? Yes  Displays appropriate judgment?Yes  Can read the correct time from a watch face?Yes   List the Names of Other Physician/Practitioners you currently use:  See referral list for the physicians patient is currently seeing.     Review of Systems: See above   Objective:     General appearance: Appears stated age and trim  Head: Normocephalic, without obvious abnormality, atraumatic  Eyes: conj clear, EOMi PEERLA  Ears: normal TM's and external ear canals both ears  Nose: Nares normal. Septum midline. Mucosa normal. No drainage or sinus tenderness.  Throat: lips, mucosa, and tongue normal; teeth and gums normal  Neck: no adenopathy, no carotid bruit, no JVD, supple, symmetrical, trachea midline and thyroid not enlarged, symmetric, no tenderness/mass/nodules  No CVA tenderness.  Lungs: clear to auscultation bilaterally  Breasts: normal appearance, no masses or tenderness Heart: regular rate and rhythm, S1, S2 normal, no murmur, click, rub or gallop  Abdomen: soft, non-tender; bowel sounds normal; no masses, no organomegaly  Musculoskeletal: ROM normal in all joints, no crepitus, no deformity, Normal muscle strengthen. Back  is symmetric, no curvature. Skin: Skin color, texture, turgor normal. No rashes or lesions  Lymph nodes: Cervical, supraclavicular, and axillary nodes normal.  Neurologic: CN 2 -12 Normal, Normal symmetric reflexes. Normal coordination and gait  Psych: Alert & Oriented x 3, Mood appear stable.    Assessment:    Annual wellness medicare exam   Plan:    During the course of the visit the patient was educated and counseled about appropriate screening and preventive services including:   Bone density  Annual  mammogram  Annual flu vaccine  Tetanus immunization is up-to-date.  Has had 2 COVID-19 vaccines     Patient Instructions (the written plan) was given to the patient.  Medicare Attestation  I have personally reviewed:  The patient's medical and social history  Their use of alcohol, tobacco or illicit drugs  Their current medications and supplements  The patient's functional ability including ADLs,fall risks, home safety risks, cognitive, and hearing and visual impairment  Diet and physical activities  Evidence for depression or mood disorders  The patient's weight, height, BMI, and visual acuity have been recorded in the chart. I have made referrals, counseling, and provided education to the patient based on review of the above and I have provided the patient with a written personalized care plan for preventive services.

## 2020-03-15 LAB — COMPLETE METABOLIC PANEL WITH GFR
AG Ratio: 1.9 (calc) (ref 1.0–2.5)
ALT: 13 U/L (ref 6–29)
AST: 22 U/L (ref 10–35)
Albumin: 4.5 g/dL (ref 3.6–5.1)
Alkaline phosphatase (APISO): 80 U/L (ref 37–153)
BUN: 15 mg/dL (ref 7–25)
CO2: 30 mmol/L (ref 20–32)
Calcium: 10.3 mg/dL (ref 8.6–10.4)
Chloride: 106 mmol/L (ref 98–110)
Creat: 0.94 mg/dL (ref 0.50–0.99)
GFR, Est African American: 72 mL/min/{1.73_m2} (ref >=60)
GFR, Est Non African American: 62 mL/min/{1.73_m2} (ref >=60)
Globulin: 2.4 g/dL (calc) (ref 1.9–3.7)
Glucose, Bld: 93 mg/dL (ref 65–99)
Potassium: 4.6 mmol/L (ref 3.5–5.3)
Sodium: 141 mmol/L (ref 135–146)
Total Bilirubin: 1 mg/dL (ref 0.2–1.2)
Total Protein: 6.9 g/dL (ref 6.1–8.1)

## 2020-03-15 LAB — CBC WITH DIFFERENTIAL/PLATELET
Absolute Monocytes: 270 cells/uL (ref 200–950)
Basophils Absolute: 9 cells/uL (ref 0–200)
Basophils Relative: 0.3 %
Eosinophils Absolute: 40 cells/uL (ref 15–500)
Eosinophils Relative: 1.3 %
HCT: 42.5 % (ref 35.0–45.0)
Hemoglobin: 14.3 g/dL (ref 11.7–15.5)
Lymphs Abs: 1203 cells/uL (ref 850–3900)
MCH: 29.7 pg (ref 27.0–33.0)
MCHC: 33.6 g/dL (ref 32.0–36.0)
MCV: 88.2 fL (ref 80.0–100.0)
MPV: 10.1 fL (ref 7.5–12.5)
Monocytes Relative: 8.7 %
Neutro Abs: 1578 cells/uL (ref 1500–7800)
Neutrophils Relative %: 50.9 %
Platelets: 226 10*3/uL (ref 140–400)
RBC: 4.82 10*6/uL (ref 3.80–5.10)
RDW: 12.7 % (ref 11.0–15.0)
Total Lymphocyte: 38.8 %
WBC: 3.1 10*3/uL — ABNORMAL LOW (ref 3.8–10.8)

## 2020-03-15 LAB — TEST AUTHORIZATION

## 2020-03-15 LAB — LIPID PANEL
Cholesterol: 201 mg/dL — ABNORMAL HIGH (ref <200)
HDL: 76 mg/dL (ref >=50)
LDL Cholesterol (Calc): 110 mg/dL (calc) — ABNORMAL HIGH
Non-HDL Cholesterol (Calc): 125 mg/dL (calc) (ref <130)
Total CHOL/HDL Ratio: 2.6 (calc) (ref <5.0)
Triglycerides: 63 mg/dL (ref <150)

## 2020-03-15 LAB — CA 125: CA 125: 15 U/mL (ref <35)

## 2020-03-15 LAB — CANCER ANTIGEN 27.29: CA 27.29: 19 U/mL (ref <38)

## 2020-03-15 LAB — TSH: TSH: 1.48 mIU/L (ref 0.40–4.50)

## 2020-03-26 ENCOUNTER — Telehealth: Payer: Self-pay | Admitting: Internal Medicine

## 2020-03-26 NOTE — Telephone Encounter (Signed)
Patient aware of results.

## 2020-03-26 NOTE — Telephone Encounter (Signed)
Lauren Calhoun [phone]  Lauren Calhoun called to see if her cancer lab results were back.

## 2020-04-03 ENCOUNTER — Encounter: Payer: Self-pay | Admitting: Internal Medicine

## 2020-04-03 NOTE — Patient Instructions (Signed)
Cancer markers are stable.  Continue to watch diet and exercise.  Return in 1 year or as needed.  It was a pleasure to see you today.

## 2020-07-17 DIAGNOSIS — Z23 Encounter for immunization: Secondary | ICD-10-CM | POA: Diagnosis not present

## 2020-07-25 ENCOUNTER — Encounter: Payer: Self-pay | Admitting: Internal Medicine

## 2020-07-25 DIAGNOSIS — H40013 Open angle with borderline findings, low risk, bilateral: Secondary | ICD-10-CM | POA: Diagnosis not present

## 2020-07-25 DIAGNOSIS — H04123 Dry eye syndrome of bilateral lacrimal glands: Secondary | ICD-10-CM | POA: Diagnosis not present

## 2020-07-25 DIAGNOSIS — H2513 Age-related nuclear cataract, bilateral: Secondary | ICD-10-CM | POA: Diagnosis not present

## 2020-07-25 DIAGNOSIS — H25013 Cortical age-related cataract, bilateral: Secondary | ICD-10-CM | POA: Diagnosis not present

## 2020-07-29 ENCOUNTER — Encounter: Payer: Self-pay | Admitting: Internal Medicine

## 2020-09-07 DIAGNOSIS — Z1231 Encounter for screening mammogram for malignant neoplasm of breast: Secondary | ICD-10-CM | POA: Diagnosis not present

## 2020-09-07 LAB — HM MAMMOGRAPHY

## 2020-09-11 ENCOUNTER — Encounter: Payer: Self-pay | Admitting: Internal Medicine

## 2020-10-17 ENCOUNTER — Other Ambulatory Visit: Payer: Self-pay

## 2020-10-17 ENCOUNTER — Ambulatory Visit (INDEPENDENT_AMBULATORY_CARE_PROVIDER_SITE_OTHER): Payer: Medicare Other | Admitting: Sports Medicine

## 2020-10-17 VITALS — BP 110/80 | Ht 66.0 in | Wt 137.8 lb

## 2020-10-17 DIAGNOSIS — S76311A Strain of muscle, fascia and tendon of the posterior muscle group at thigh level, right thigh, initial encounter: Secondary | ICD-10-CM

## 2020-10-18 ENCOUNTER — Encounter: Payer: Self-pay | Admitting: Sports Medicine

## 2020-10-18 NOTE — Progress Notes (Signed)
   Subjective:    Patient ID: Lauren Calhoun, female    DOB: September 27, 1951, 70 y.o.   MRN: [id number]  HPI chief complaint: Left knee pain  Lauren Calhoun comes in today complaining of 3 weeks of left knee pain.  It is primarily in the posterior aspect of her knee.  No specific trauma that she can recall.  Has not noticed any swelling.  No feelings of instability.  She has a body helix sleeve which she has been wearing but it increases her pain.  She is questioning whether or not she has contributing hip weakness on the same side.    Review of Systems As above    Objective:   Physical Exam  Well-developed, fit appearing.  No acute distress  Left knee: Full range of motion.  No effusion.  She is tender to palpation in the popliteal fossa, primarily along the lateral hamstring tendons.  No tenderness along medial lateral joint lines.  Knee is grossly stable to ligamentous exam.  Left hip: Smooth painless hip range of motion.  She does have 4/5 strength with resisted hip abduction and a positive Trendelenburg.  Limited MSK ultrasound of the left knee shows no obvious effusion.  Mild spurring along the medial joint space.  No Baker's cyst is seen.  Point of maximum tenderness is over the distal biceps femoris tendon.  No tear is seen.      Assessment & Plan:   Left knee pain likely secondary to distal hamstring strain Left hip weakness  We are going to give Melitza some asking exercises to start rehabbing her distal hamstring strain.  We will also give her some hip abductor strengthening exercises and several pelvic stabilizer exercises including step ups and crossovers.  She will follow-up with [place] again in 6 weeks.  In the meantime, I think she can discontinue her body helix compression sleeve for now since it does seem to increase her discomfort.

## 2020-11-28 ENCOUNTER — Other Ambulatory Visit: Payer: Self-pay

## 2020-11-28 ENCOUNTER — Ambulatory Visit
Admission: RE | Admit: 2020-11-28 | Discharge: 2020-11-28 | Disposition: A | Discharge status: Home / Self Care | Payer: Medicare Other | Source: Ambulatory Visit | Attending: Sports Medicine | Admitting: Sports Medicine

## 2020-11-28 ENCOUNTER — Ambulatory Visit (INDEPENDENT_AMBULATORY_CARE_PROVIDER_SITE_OTHER): Payer: Medicare Other | Admitting: Sports Medicine

## 2020-11-28 VITALS — BP 118/80 | Ht 66.0 in | Wt 138.0 lb

## 2020-11-28 DIAGNOSIS — N8189 Other female genital prolapse: Secondary | ICD-10-CM | POA: Diagnosis not present

## 2020-11-28 DIAGNOSIS — M25562 Pain in left knee: Secondary | ICD-10-CM

## 2020-11-28 MED ORDER — MELOXICAM 15 MG PO TABS: ORAL_TABLET | ORAL | 0 refills | Status: DC

## 2020-11-28 NOTE — Progress Notes (Signed)
° °  Subjective:    Patient ID: Lauren Calhoun, female    DOB: July 07, 1951, 70 y.o.   MRN: [id number]  HPI   Jakeisha comes in today for follow-up on left knee pain.  Unfortunately, she continues to struggle despite gaining strength in her left hip.  She has been very diligent about her home exercises but despite that she is still getting pain diffusely throughout the knee.  She endorses pain as well as stiffness and tightness with activity such as walking.  She also notices limited flexion in this knee.  She continues to endorse a small palpable area in the popliteal fossa which is intermittent.  Previous ultrasound of this area did not show a Baker's cyst.   Review of Systems    As above Objective:   Physical Exam  Well-developed, well-nourished.  No acute distress  Examination of her left hip shows excellent strength with resisted hip abduction and a negative Trendelenburg.  This is a marked improvement over her last exam.  Examination of the left knee shows range of motion from 0 to about 125 degrees.  No effusion.  No palpable mass in the popliteal fossa.  No significant tenderness to palpation.  Knee remained stable ligamentous exam.  No tenderness along medial lateral joint lines.  X-rays of the left knee including AP, lateral, sunrise, and 30 degree flexion views show a mild amount of medial compartmental narrowing on the 30 degree flexion view.  Also a slight lateral tilt to the patella.  Overall there are minimal degenerative changes seen.  No obvious loose body.      Assessment & Plan:   Persistent left knee pain likely secondary to very mild DJD versus possible occult loose body  I discussed treatment options including cortisone injection versus oral anti-inflammatories and physical therapy.  Patient would like to try oral anti-inflammatories first.  We will start meloxicam 15 mg daily with food for 7 days and then as needed.  I will refer her for physical therapy at [facility] outpatient  rehab.  She would also like to address some pelvic floor weakness that may be contributing to some of her symptoms.  She will follow up with me again in 1 month or sooner if she does not notice improvement with the oral NSAIDs.  If that is the case, then we will reconsider cortisone injection and possible further diagnostic imaging to rule out an occult loose body.

## 2020-12-23 ENCOUNTER — Other Ambulatory Visit: Payer: Self-pay

## 2020-12-23 ENCOUNTER — Ambulatory Visit (INDEPENDENT_AMBULATORY_CARE_PROVIDER_SITE_OTHER): Payer: Medicare Other | Admitting: Sports Medicine

## 2020-12-23 VITALS — BP 104/64

## 2020-12-23 DIAGNOSIS — M25562 Pain in left knee: Secondary | ICD-10-CM

## 2020-12-23 NOTE — Progress Notes (Signed)
   Subjective:    Patient ID: Lauren Calhoun, female    DOB: 26-Jun-1951, 70 y.o.   MRN: [id number]  HPI   Lauren Calhoun comes in today for follow-up on left knee pain.  She is still experiencing pain despite conservative treatment to date.  She has been very diligent with her home exercises and is awaiting her first formal physical therapy visit.  Her main complaint is an inability to completely flex the left knee.  She still endorses a palpable lump in the popliteal fossa.  Previous ultrasound did not show an obvious Baker's cyst.  Previous x-rays showed mild degenerative changes.    Review of Systems As above    Objective:   Physical Exam  Well-developed, fit appearing.  No acute distress  Left knee: Patient lacks about 10 to 15 degrees of active left knee flexion when compared to the uninvolved right knee.  No effusion.  She is tender to palpation along the posterior lateral joint line with pain but no popping with McMurray's.  No palpable cyst in the popliteal fossa.  Knee remained stable ligamentous exam.  Neurovascularly intact distally.  Previous x-rays and ultrasound are as above      Assessment & Plan:   Persistent left knee pain and decreased range of motion-rule out occult loose body  Patient has failed conservative treatment to date and there is concern that she may have an occult loose body not seen on x-ray blocking her range of motion.  Therefore, I recommend that we proceed with an MRI scan to rule this out.  If no loose body is seen, then we will discuss proceeding with treatment for her knee osteoarthritis.  Phone follow-up with MRI results when available and we will delineate further treatment based on those findings.  In the meantime, she may continue with activity as tolerated using pain as her guide.

## 2020-12-24 ENCOUNTER — Ambulatory Visit: Payer: Medicare Other | Admitting: Sports Medicine

## 2020-12-28 ENCOUNTER — Other Ambulatory Visit: Payer: Self-pay | Admitting: Sports Medicine

## 2021-01-07 ENCOUNTER — Other Ambulatory Visit: Payer: Self-pay

## 2021-01-07 ENCOUNTER — Ambulatory Visit
Admission: RE | Admit: 2021-01-07 | Discharge: 2021-01-07 | Disposition: A | Discharge status: Home / Self Care | Payer: Medicare Other | Source: Ambulatory Visit | Attending: Sports Medicine | Admitting: Sports Medicine

## 2021-01-07 DIAGNOSIS — M25462 Effusion, left knee: Secondary | ICD-10-CM | POA: Diagnosis not present

## 2021-01-07 DIAGNOSIS — M25562 Pain in left knee: Secondary | ICD-10-CM

## 2021-01-07 DIAGNOSIS — M7642 Tibial collateral bursitis [Pellegrini-Stieda], left leg: Secondary | ICD-10-CM | POA: Diagnosis not present

## 2021-01-07 DIAGNOSIS — S83242A Other tear of medial meniscus, current injury, left knee, initial encounter: Secondary | ICD-10-CM | POA: Diagnosis not present

## 2021-01-08 ENCOUNTER — Telehealth: Payer: Self-pay | Admitting: Sports Medicine

## 2021-01-08 NOTE — Telephone Encounter (Signed)
  I spoke with Lauren Calhoun on the phone today after reviewing the MRI of her left knee.  Dominant finding is a large grade 3 oblique tear of the posterior horn and mid body of the medial meniscus.  She also has some moderate degenerative changes in the medial compartment.  Based on her MRI findings and persistent symptoms, I think consultation with orthopedic surgery is warranted to discuss possible arthroscopy.  I will set up a consultation with Dr. Rhona Raider to discuss this further.  Patient is in agreement with that plan.  I will defer further work-up and treatment to the discretion of Dr. Rhona Raider and the patient will follow up with me as needed.

## 2021-01-08 NOTE — Telephone Encounter (Signed)
Dr. [facility] [address] [place], Lauren Calhoun  Appt: 01/22/21 @ 10 am

## 2021-01-13 ENCOUNTER — Ambulatory Visit: Payer: Medicare Other | Attending: Sports Medicine | Admitting: Physical Therapy

## 2021-01-13 ENCOUNTER — Encounter: Payer: Self-pay | Admitting: Physical Therapy

## 2021-01-13 ENCOUNTER — Other Ambulatory Visit: Payer: Self-pay

## 2021-01-13 DIAGNOSIS — R252 Cramp and spasm: Secondary | ICD-10-CM | POA: Diagnosis not present

## 2021-01-13 DIAGNOSIS — M25552 Pain in left hip: Secondary | ICD-10-CM | POA: Insufficient documentation

## 2021-01-13 DIAGNOSIS — M6281 Muscle weakness (generalized): Secondary | ICD-10-CM | POA: Insufficient documentation

## 2021-01-13 NOTE — Therapy (Signed)
[facility] [address] [place], [place], [address] Phone: [phone]   Fax:  [phone]  Physical Therapy Evaluation  Patient Details  Name: Lauren Calhoun MRN: [id number] Date of Birth: January 17, 1951 Referring Provider (PT): Dr. Lilia Argue   Encounter Date: 01/13/2021   PT End of Session - 01/13/21 1414    Visit Number 1    Date for PT Re-Evaluation 04/07/21    Authorization Type medicare/medicaid    PT Start Time 1400    PT Stop Time 1440    PT Time Calculation (min) 40 min    Activity Tolerance Patient tolerated treatment well    Behavior During Therapy [facility] for tasks assessed/performed           Past Medical History:  Diagnosis Date  . Cancer (HCC)    Breast,sential node  . Chronic kidney disease    kidney stone now ct scan  . Ovarian cancer ([place])   . Tear of meniscus of knee joint    presently has a tear left knee    Past Surgical History:  Procedure Laterality Date  . ABDOMINAL HYSTERECTOMY  08/23/2012   Procedure: HYSTERECTOMY ABDOMINAL;  Surgeon: Alvino Chapel, MD;  Location: [facility];  Service: Gynecology;  Laterality: N/A;  Perineal biopsies  . APPENDECTOMY  AGE 52  . BREAST LUMPECTOMY  2005   RIGHT  . LYMPHADENECTOMY  08/23/2012   Procedure: LYMPHADENECTOMY;  Surgeon: Alvino Chapel, MD;  Location: [facility];  Service: Gynecology;;  Pelvic and para-aortic  . OMENTECTOMY  08/23/2012   Procedure: OMENTECTOMY;  Surgeon: Alvino Chapel, MD;  Location: [facility];  Service: Gynecology;;  . SALPINGOOPHORECTOMY  08/23/2012   Procedure: SALPINGO OOPHORECTOMY;  Surgeon: Alvino Chapel, MD;  Location: [facility];  Service: Gynecology;  Laterality: Bilateral;    There were no vitals filed for this visit.    Subjective Assessment - 01/13/21 1401    Subjective Patient has a grade 3 medial meniscus tear. I see the orthopedic surgery next week. When get out of the car she  has a grabbing or weakness in the left groin when getting out of the car. Patient is uneven with her muscle strength. Patient has a scar on the abdomen from a total hysterectomy.    Pertinent History Breast cancer and ovarian cancer    Patient Stated Goals move and use her body like in the past    Currently in Pain? Yes    Pain Score 8     Pain Location Knee    Pain Orientation Left    Pain Descriptors / Indicators Sharp    Pain Type Chronic pain    Pain Onset More than a month ago    Pain Frequency Intermittent    Aggravating Factors  random, bending knee too far, walking,    Pain Relieving Factors change position    Multiple Pain Sites Yes    Pain Score 3    Pain Location Groin    Pain Orientation Left    Pain Descriptors / Indicators Discomfort    Pain Type Acute pain    Pain Onset More than a month ago    Pain Frequency Intermittent    Aggravating Factors  swing leg out to get out of car, stand to get off the couch, lunge position,    Pain Relieving Factors get out of position              [facility] PT Assessment - 01/13/21 0001  Assessment   Medical Diagnosis N81.89 Pelvic floor weakness in female; M25.562 Left knee pain, unspecified chronicity    Referring Provider (PT) Dr. Lilia Argue    Prior Therapy none      Precautions   Precautions Other (comment)    Precaution Comments cancer      Restrictions   Weight Bearing Restrictions No      Balance Screen   Has the patient fallen in the past 6 months No    Has the patient had a decrease in activity level because of a fear of falling?  No    Is the patient reluctant to leave their home because of a fear of falling?  No      Home Ecologist residence      Prior Function   Level of Independence Independent      Cognition   Overall Cognitive Status Within Functional Limits for tasks assessed      ROM / Strength   AROM / PROM / Strength AROM;PROM;Strength      PROM   Overall  PROM Comments endrange left knee flexion felt resistance and spongy endfield    Right Knee Extension 0    Left Knee Extension -2      Strength   Left Hip ABduction 4/5    Left Knee Flexion 4/5   lateral head of the hamstring     Palpation   Palpation comment tenderness located in the post. lateral left knee; abdominal contactio on the left lower is not as strong as the right; bilateral lower rib cage is flared out      Ambulation/Gait   Stairs Yes    Stair Management Technique Step to pattern   going down steps                     Objective measurements completed on examination: See above findings.     Pelvic Floor Special Questions - 01/13/21 0001    Prior Pregnancies Yes    Number of Pregnancies 4    Number of Vaginal Deliveries 4    Currently Sexually Active Yes    Is this Painful Yes    Urinary Leakage No    Urinary urgency No    Fecal incontinence No    Skin Integrity Intact    Perineal Body/Introitus  Other    Perineal Body/Introitus other tight    Pelvic Floor Internal Exam Patient confirms identification and approves PT to assess and treat the pelvic floor    Exam Type Vaginal    Palpation tightness on the left side of the urethra and bladder, band of tissue on the right; tenderness located on the illiococcygeus, obturator internist,    Strength weak squeeze, no lift   weak lift                     PT Short Term Goals - 01/13/21 1721      PT SHORT TERM GOAL #1   Title independent with initial HEP for stretches    Time 4    Period Weeks    Status New    Target Date 02/10/21      PT SHORT TERM GOAL #2   Title left knee pain decreased >/= 25% with daily activities    Time 4    Period Weeks    Status New    Target Date 02/10/21      PT SHORT TERM GOAL #3   Title  left groin pain decreased >/= 25% due to improve mobility    Time 4    Period Weeks    Status New    Target Date 02/10/21             PT Long Term Goals -  01/13/21 1723      PT LONG TERM GOAL #1   Title independent with advanced HEP with strengthening and correcting muscle imbalances    Time 12    Period Weeks    Status New    Target Date 04/07/21      PT LONG TERM GOAL #2   Title able to get in and out of the car with pain level decreased >/= 75% for hip and 50% for the knee due to improved muscle imbalances    Time 12    Period Weeks    Status New    Target Date 04/07/21      PT LONG TERM GOAL #3   Title able to go into a lunge position with left hip having less pain due to improved lengthening of the fascial structures    Time 12    Period Weeks    Status New    Target Date 04/07/21      PT LONG TERM GOAL #4   Title going from the couch to standing due to increased strength of the left hip and abdominals with greater ease    Time 12    Period Weeks    Status New    Target Date 04/07/21                  Plan - 01/13/21 1712    Clinical Impression Statement Patient is a 70 year old female with history of breast and ovarian cancer. She presently has left groin pain at level 3/10 intermittently and left knee pain at level 8/10. Patient has a grade 3 medial meniscal tear and is seeing an orthopedic doctor next week for it. Left lateral hamstring strength is 4/5 and has tenderness. Patient has palpable tenderness located in the lateral posterior knee. Left ilium is rotated posteriorly, sacrum rotated to the left and decreased springing of the left SI joint. She has decreased fascial mobility of her abdominal scar from her hysterectomy and removal of the ovarian cancer. Pelvic floor strength is 2/5. She has tightness on the left side of the urethra and bladder. Tenderness located on the left obturator internist, iliococcygeus, band on the right side of the introitus and decreased movement of the perineal body. She has decreased strength in the left hip and left lower abdominal do not contract compared to the right side. Patient is  having difficulty with progressing her yoga due to the pain and decreased strength.    Personal Factors and Comorbidities Fitness;Comorbidity 3+    Comorbidities Breast cancer; ovarian cancer, Abdominal hysterectomy, appendectomy    Examination-Activity Limitations Locomotion Level    Stability/Clinical Decision Making Evolving/Moderate complexity    Clinical Decision Making Low    Rehab Potential Excellent    PT Frequency 1x / week    PT Duration 12 weeks    PT Treatment/Interventions ADLs/Self Care Home Management;Biofeedback;Electrical Stimulation;Neuromuscular re-education;Therapeutic exercise;Therapeutic activities;Patient/family education;Manual techniques;Dry needling;Scar mobilization;Spinal Manipulations;Joint Manipulations    PT Next Visit Plan correct pelvis, internal mobilizaiton, abdominal scar massage using the suction cup, work on engaging the lower abdominals, stretching of the anterior left hip    Consulted and Agree with Plan of Care Patient  Patient will benefit from skilled therapeutic intervention in order to improve the following deficits and impairments:  Decreased coordination,Decreased range of motion,Increased fascial restricitons,Decreased endurance,Decreased activity tolerance,Increased muscle spasms,Pain,Decreased strength,Decreased mobility,Decreased scar mobility  Visit Diagnosis: Muscle weakness (generalized) - Plan: PT plan of care cert/re-cert  Pain in left hip - Plan: PT plan of care cert/re-cert  Cramp and spasm - Plan: PT plan of care cert/re-cert     Problem List Patient Active Problem List   Diagnosis Date Noted  . Stenosing tenosynovitis of finger of right hand 11/10/2018  . Acute pain of right knee 12/06/2017  . Tinnitus, bilateral 04/14/2017  . Impacted cerumen of both ears 04/14/2017  . Eustachian tube dysfunction, bilateral 04/14/2017  . Urinary incontinence 01/23/2013  . 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  Breast cancer ([place]) 08/05/2012  . Lateral meniscus derangement 07/20/2012    Earlie Counts, PT 01/13/21 5:30 PM   [facility] [address] [place], [place], [address] Phone: [phone]   Fax:  [phone]  Name: Lauren Calhoun MRN: [id number] Date of Birth: [birth year]

## 2021-01-21 ENCOUNTER — Encounter: Payer: Medicare Other | Attending: Sports Medicine | Admitting: Physical Therapy

## 2021-01-21 ENCOUNTER — Encounter: Payer: Self-pay | Admitting: Physical Therapy

## 2021-01-21 ENCOUNTER — Other Ambulatory Visit: Payer: Self-pay

## 2021-01-21 DIAGNOSIS — M25552 Pain in left hip: Secondary | ICD-10-CM | POA: Insufficient documentation

## 2021-01-21 DIAGNOSIS — M6281 Muscle weakness (generalized): Secondary | ICD-10-CM | POA: Insufficient documentation

## 2021-01-21 DIAGNOSIS — R252 Cramp and spasm: Secondary | ICD-10-CM | POA: Insufficient documentation

## 2021-01-21 NOTE — Therapy (Signed)
[place] [facility] [address] [place], [place], [address] Phone: [phone]   Fax:  [phone]  Physical Therapy Treatment  Patient Details  Name: Lauren Calhoun MRN: [id number] Date of Birth: December 28, 1950 Referring Provider (PT): Dr. Lilia Argue   Encounter Date: 01/21/2021   PT End of Session - 01/21/21 1319    Visit Number 2    Date for PT Re-Evaluation 04/07/21    Authorization Type medicare/medicaid    Authorization - Visit Number 2    Authorization - Number of Visits 10    PT Start Time 1130    PT Stop Time 1225    PT Time Calculation (min) 55 min    Activity Tolerance Patient tolerated treatment well    Behavior During Therapy [facility] for tasks assessed/performed           Past Medical History:  Diagnosis Date  . Cancer (HCC)    Breast,sential node  . Chronic kidney disease    kidney stone now ct scan  . Ovarian cancer ([place])   . Tear of meniscus of knee joint    presently has a tear left knee    Past Surgical History:  Procedure Laterality Date  . ABDOMINAL HYSTERECTOMY  08/23/2012   Procedure: HYSTERECTOMY ABDOMINAL;  Surgeon: Alvino Chapel, MD;  [facility]: [facility];  Service: Gynecology;  Laterality: N/A;  Perineal biopsies  . APPENDECTOMY  AGE 2  . BREAST LUMPECTOMY  2005   RIGHT  . LYMPHADENECTOMY  08/23/2012   Procedure: LYMPHADENECTOMY;  Surgeon: Alvino Chapel, MD;  [facility]: [facility];  Service: Gynecology;;  Pelvic and para-aortic  . OMENTECTOMY  08/23/2012   Procedure: OMENTECTOMY;  Surgeon: Alvino Chapel, MD;  [facility];  Service: Gynecology;;  . SALPINGOOPHORECTOMY  08/23/2012   Procedure: SALPINGO OOPHORECTOMY;  Surgeon: Alvino Chapel, MD;  [facility];  Service: Gynecology;  Laterality: Bilateral;    There were no vitals filed for this visit.   Subjective Assessment - 01/21/21 1135    Subjective AFter leaving therapy felt spacey for 10  minutes. I have added more gluteal work and feel it in  a good way. When I get up I am not limping on the left. I can finally sit cross legged in weaks. Less stabbing and achiness in the left knee. I see th esurgeon otmorrow. Patient has not had her left groin pain since the initial eval.    Pertinent History Breast cancer and ovarian cancer    Patient Stated Goals move and use her body like in the past    Currently in Pain? Yes    Pain Score 6     Pain Location Knee    Pain Orientation Left    Pain Descriptors / Indicators Sharp;Aching    Pain Type Chronic pain    Pain Onset More than a month ago    Pain Frequency Intermittent    Aggravating Factors  random, bending knee too far, walking    Pain Relieving Factors change position    Multiple Pain Sites No                             [facility] Adult PT Treatment/Exercise - 01/21/21 0001      Lumbar Exercises: Supine   Ab Set 20 reps;1 second;5 seconds    AB Set Limitations with tactile and verbal cues to engage the lower abdominals equally with the upper and  doing a ball squeeze to engage further      Lumbar Exercises: Quadruped   Other Quadruped Lumbar Exercises fire hydrant with tactile cues to engage the lower abdominals and gluteals correctly wihtout twisting her back 10x each side      Knee/Hip Exercises: Standing   Other Standing Knee Exercises stand on one leg and tip forward keeping trunk control , engaging the core, and not rotating the trunk . When stands back up engage the gluteals and mastring 10x each side      Knee/Hip Exercises: Supine   Other Supine Knee/Hip Exercises supine into the wheel position for yoga 3 times without pain in the left groin and first time doing this in weeks      Manual Therapy   Manual Therapy Myofascial release;Soft tissue mobilization    Soft tissue mobilization right sidelying and manual mobilization to the left quadratus    Myofascial Release using a scuction cup to pull up on  the scar and release the scar and tissue surroinding it in different directions, use the suction cup all over the abdomen especially the upper abdomen to release the upper abdominals due to over contracting; tissue rolling of the scar tissue, laying on the right side and release the left lower abdominal area with pulling the left leg into different directions                  PT Education - 01/21/21 1316    Education Details had patient show therapist her exercise routine and educated her on how to incorporate the abdominals correctly and working on trunk control    Person(s) Educated Patient    Methods Explanation;Demonstration    Comprehension Verbalized understanding;Returned demonstration            PT Short Term Goals - 01/21/21 1325      PT SHORT TERM GOAL #2   Title left knee pain decreased >/= 25% with daily activities    Time 4    Period Weeks    Status Achieved      PT SHORT TERM GOAL #3   Title left groin pain decreased >/= 25% due to improve mobility    Time 4    Period Weeks    Status Achieved             PT Long Term Goals - 01/13/21 1723      PT LONG TERM GOAL #1   Title independent with advanced HEP with strengthening and correcting muscle imbalances    Time 12    Period Weeks    Status New    Target Date 04/07/21      PT LONG TERM GOAL #2   Title able to get in and out of the car with pain level decreased >/= 75% for hip and 50% for the knee due to improved muscle imbalances    Time 12    Period Weeks    Status New    Target Date 04/07/21      PT LONG TERM GOAL #3   Title able to go into a lunge position with left hip having less pain due to improved lengthening of the fascial structures    Time 12    Period Weeks    Status New    Target Date 04/07/21      PT LONG TERM GOAL #4   Title going from the couch to standing due to increased strength of the left hip and abdominals with greater ease  Time 12    Period Weeks    Status New     Target Date 04/07/21                 Plan - 01/21/21 1320    Clinical Impression Statement Patient was not having left hip pain after her initial evaluation. Her knee pain decreased from 8/10-6/10 after initial visit. Her pelvis was in correct alignment today.  After manual work today the patient was able to bend her left knee and hip with greater ease, she was able to engage the left lower abdominals better and not over work the upper abdominals. Patient has learned how to engage the lower abdominals easier with her currect home exercise program. Patietn was able to do the yoga pose The Wheel for the first time in weeks and sit cross legged. Patient will benefi tfrom skilled therapy to to improve tissue mogility and strength so she is able to resume her yoga routine and her daily exercise routine.    Personal Factors and Comorbidities Fitness;Comorbidity 3+    Comorbidities Breast cancer; ovarian cancer, Abdominal hysterectomy, appendectomy    Examination-Activity Limitations Locomotion Level    Stability/Clinical Decision Making Evolving/Moderate complexity    Rehab Potential Excellent    PT Frequency 1x / week    PT Duration 12 weeks    PT Treatment/Interventions ADLs/Self Care Home Management;Biofeedback;Electrical Stimulation;Neuromuscular re-education;Therapeutic exercise;Therapeutic activities;Patient/family education;Manual techniques;Dry needling;Scar mobilization;Spinal Manipulations;Joint Manipulations    PT Next Visit Plan internal mobilizaiton, go over the rest of her exercise routine to see if she is performing correctly; see how it went with the knee surgeon appt.    Recommended Other Services MD signed initial note    Consulted and Agree with Plan of Care Patient           Patient will benefit from skilled therapeutic intervention in order to improve the following deficits and impairments:  Decreased coordination,Decreased range of motion,Increased fascial  restricitons,Decreased endurance,Decreased activity tolerance,Increased muscle spasms,Pain,Decreased strength,Decreased mobility,Decreased scar mobility  Visit Diagnosis: Muscle weakness (generalized)  Pain in left hip  Cramp and spasm     Problem List Patient Active Problem List   Diagnosis Date Noted  . Stenosing tenosynovitis of finger of right hand 11/10/2018  . Acute pain of right knee 12/06/2017  . Tinnitus, bilateral 04/14/2017  . Impacted cerumen of both ears 04/14/2017  . Eustachian tube dysfunction, bilateral 04/14/2017  . Urinary incontinence 01/23/2013  . 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 Breast cancer ([place]) 08/05/2012  . Lateral meniscus derangement 07/20/2012    Earlie Counts, PT 01/21/21 1:26 PM   [facility] [address] [place], [place], [address] Phone: [phone]   Fax:  [phone]  Name: SHAUNIKA ITALIANO MRN: [id number] Date of Birth: 09/23/1951

## 2021-01-22 DIAGNOSIS — M25562 Pain in left knee: Secondary | ICD-10-CM | POA: Diagnosis not present

## 2021-01-27 ENCOUNTER — Ambulatory Visit: Payer: Medicare Other | Admitting: Physical Therapy

## 2021-01-27 ENCOUNTER — Other Ambulatory Visit: Payer: Self-pay

## 2021-01-27 ENCOUNTER — Encounter: Payer: Self-pay | Admitting: Physical Therapy

## 2021-01-27 DIAGNOSIS — M6281 Muscle weakness (generalized): Secondary | ICD-10-CM

## 2021-01-27 DIAGNOSIS — R252 Cramp and spasm: Secondary | ICD-10-CM | POA: Diagnosis not present

## 2021-01-27 DIAGNOSIS — M25552 Pain in left hip: Secondary | ICD-10-CM | POA: Diagnosis not present

## 2021-01-27 NOTE — Therapy (Signed)
[facility] [address] [place], [place], [address] Phone: [phone]   Fax:  [phone]  Physical Therapy Treatment  Patient Details  Name: Lauren Calhoun MRN: [id number] Date of Birth: 06-09-1951 Referring Provider (PT): Dr. Lilia Argue   Encounter Date: 01/27/2021   PT End of Session - 01/27/21 1141    Visit Number 3    Date for PT Re-Evaluation 04/07/21    Authorization Type medicare/medicaid    Authorization - Visit Number 3    Authorization - Number of Visits 10    PT Start Time 1100    PT Stop Time 1141    PT Time Calculation (min) 41 min    Activity Tolerance Patient tolerated treatment well    Behavior During Therapy [facility] for tasks assessed/performed           Past Medical History:  Diagnosis Date  . Cancer (HCC)    Breast,sential node  . Chronic kidney disease    kidney stone now ct scan  . Ovarian cancer ([place])   . Tear of meniscus of knee joint    presently has a tear left knee    Past Surgical History:  Procedure Laterality Date  . ABDOMINAL HYSTERECTOMY  08/23/2012   Procedure: HYSTERECTOMY ABDOMINAL;  Surgeon: Alvino Chapel, MD;  Location: [facility];  Service: Gynecology;  Laterality: N/A;  Perineal biopsies  . APPENDECTOMY  AGE 27  . BREAST LUMPECTOMY  2005   RIGHT  . LYMPHADENECTOMY  08/23/2012   Procedure: LYMPHADENECTOMY;  Surgeon: Alvino Chapel, MD;  Location: [facility];  Service: Gynecology;;  Pelvic and para-aortic  . OMENTECTOMY  08/23/2012   Procedure: OMENTECTOMY;  Surgeon: Alvino Chapel, MD;  Location: [facility];  Service: Gynecology;;  . SALPINGOOPHORECTOMY  08/23/2012   Procedure: SALPINGO OOPHORECTOMY;  Surgeon: Alvino Chapel, MD;  Location: [facility];  Service: Gynecology;  Laterality: Bilateral;    There were no vitals filed for this visit.   Subjective Assessment - 01/27/21 1105    Subjective When I do down dog my left heel hits the  floor. Yesterday I was able to wear zippered pants and the zipper goes to the left. I am doing the PT for my knee and engage the core andpelvic floor. Orthopedic said she has an impresive meniscal tear and she can manage it. If unable to manage it then see him for possible surgery. My abdominal scar is less sensitive.    Pertinent History Breast cancer and ovarian cancer    Patient Stated Goals move and use her body like in the past    Currently in Pain? Yes    Pain Score 3    can incresae to 6/10   Pain Location Knee    Pain Orientation Left    Pain Descriptors / Indicators Aching;Sharp    Pain Type Chronic pain    Pain Onset More than a month ago    Pain Frequency Intermittent    Aggravating Factors  random, bending knee too far; walking    Pain Relieving Factors change position    Multiple Pain Sites Yes    Pain Score 3    Pain Location Groin    Pain Orientation Left    Pain Descriptors / Indicators Discomfort    Pain Type Acute pain    Pain Onset More than a month ago    Pain Frequency Intermittent    Aggravating Factors  swing leg to get out of car, stand to  get off the couch, lunge posisiotn    Pain Relieving Factors get out of position                          Pelvic Floor Special Questions - 01/27/21 0001    Pelvic Floor Internal Exam Patient confirms identification and approves PT to assess and treat the pelvic floor    Exam Type Vaginal    Strength fair squeeze, definite lift   weak lift            [facility] Adult PT Treatment/Exercise - 01/27/21 0001      Neuro Re-ed    Neuro Re-ed Details  pelvic floro contraction with tactile cues to increase her lieft with lower abdominal contraction      Manual Therapy   Manual Therapy Internal Pelvic Floor    Internal Pelvic Floor manual work to the perineal body, transverse perineum, alon gth epuborectalis, iliococcygeus, piriformis, fascial release of the left side of the bladder while monitoring for pain and  release of the tissue                    PT Short Term Goals - 01/27/21 1146      PT SHORT TERM GOAL #1   Title independent with initial HEP for stretches    Time 4    Period Weeks    Status Achieved      PT SHORT TERM GOAL #2   Title left knee pain decreased >/= 25% with daily activities    Time 4    Period Weeks    Status Achieved      PT SHORT TERM GOAL #3   Title left groin pain decreased >/= 25% due to improve mobility    Time 4    Period Weeks    Status Achieved             PT Long Term Goals - 01/13/21 1723      PT LONG TERM GOAL #1   Title independent with advanced HEP with strengthening and correcting muscle imbalances    Time 12    Period Weeks    Status New    Target Date 04/07/21      PT LONG TERM GOAL #2   Title able to get in and out of the car with pain level decreased >/= 75% for hip and 50% for the knee due to improved muscle imbalances    Time 12    Period Weeks    Status New    Target Date 04/07/21      PT LONG TERM GOAL #3   Title able to go into a lunge position with left hip having less pain due to improved lengthening of the fascial structures    Time 12    Period Weeks    Status New    Target Date 04/07/21      PT LONG TERM GOAL #4   Title going from the couch to standing due to increased strength of the left hip and abdominals with greater ease    Time 12    Period Weeks    Status New    Target Date 04/07/21                 Plan - 01/27/21 1142    Clinical Impression Statement Patient had losts of fascial release of the pelvic floor during internal work. She had increased pliability of the left side of perineal  body. Patient is able to do some of her yoga moves with greater ease. Patients abdominal scar is less sensitive. Patient felt she had increased blood floor to the pelvic floor area. Patient is able to move her left leg out to tthe side when gettin gout of the car with greater ease. Patient will benefit from  skilled therapy to improve tissue mobility and strength so she is able to resume her yoga routine and her daily exercise routine.    Personal Factors and Comorbidities Fitness;Comorbidity 3+    Comorbidities Breast cancer; ovarian cancer, Abdominal hysterectomy, appendectomy    Examination-Activity Limitations Locomotion Level    Stability/Clinical Decision Making Evolving/Moderate complexity    Rehab Potential Excellent    PT Frequency 1x / week    PT Treatment/Interventions ADLs/Self Care Home Management;Biofeedback;Electrical Stimulation;Neuromuscular re-education;Therapeutic exercise;Therapeutic activities;Patient/family education;Manual techniques;Dry needling;Scar mobilization;Spinal Manipulations;Joint Manipulations    PT Next Visit Plan internal mobilizaiton, go over the rest of her exercise routine to see if she is performing correctly;    Consulted and Agree with Plan of Care Patient           Patient will benefit from skilled therapeutic intervention in order to improve the following deficits and impairments:  Decreased coordination,Decreased range of motion,Increased fascial restricitons,Decreased endurance,Decreased activity tolerance,Increased muscle spasms,Pain,Decreased strength,Decreased mobility,Decreased scar mobility  Visit Diagnosis: Muscle weakness (generalized)  Pain in left hip  Cramp and spasm     Problem List Patient Active Problem List   Diagnosis Date Noted  . Stenosing tenosynovitis of finger of right hand 11/10/2018  . Acute pain of right knee 12/06/2017  . Tinnitus, bilateral 04/14/2017  . Impacted cerumen of both ears 04/14/2017  . Eustachian tube dysfunction, bilateral 04/14/2017  . Urinary incontinence 01/23/2013  . 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 Breast cancer ([place]) 08/05/2012  . Lateral meniscus derangement 07/20/2012    Earlie Counts, PT 01/27/21 11:47 AM   [facility] [address] [place], [place], [address] Phone: [phone]   Fax:  [phone]  Name: Lauren Calhoun MRN: [id number] Date of Birth: 08-Aug-1951

## 2021-02-06 ENCOUNTER — Encounter: Payer: Medicare Other | Admitting: Physical Therapy

## 2021-02-12 ENCOUNTER — Encounter: Payer: Medicare Other | Admitting: Physical Therapy

## 2021-02-24 ENCOUNTER — Encounter: Payer: Medicare Other | Admitting: Physical Therapy

## 2021-02-27 ENCOUNTER — Encounter: Payer: Self-pay | Admitting: Physical Therapy

## 2021-02-27 ENCOUNTER — Other Ambulatory Visit: Payer: Self-pay

## 2021-02-27 ENCOUNTER — Ambulatory Visit: Payer: Medicare Other | Attending: Sports Medicine | Admitting: Physical Therapy

## 2021-02-27 DIAGNOSIS — M25552 Pain in left hip: Secondary | ICD-10-CM | POA: Diagnosis not present

## 2021-02-27 DIAGNOSIS — M6281 Muscle weakness (generalized): Secondary | ICD-10-CM | POA: Diagnosis not present

## 2021-02-27 DIAGNOSIS — R252 Cramp and spasm: Secondary | ICD-10-CM | POA: Insufficient documentation

## 2021-02-27 NOTE — Patient Instructions (Signed)
Access Code: [email or web address] URL: [email or web address] Date: 02/27/2021 Prepared by: [place] with Hip Abduction - 1 x daily - 7 x weekly - 1 sets - 10 reps Modified Side Plank with Hip Abduction - 1 x daily - 7 x weekly - 1 sets - 10 reps  [facility] [address] [place], [place] [address] Phone # [phone] Fax [phone]

## 2021-02-27 NOTE — Therapy (Addendum)
[facility] [address] [place], [place], [address] Phone: [phone]   Fax:  [phone]  Physical Therapy Treatment  Patient Details  Name: Lauren Calhoun MRN: [id number] Date of Birth: 1951/02/28 Referring Provider (PT): Dr. Lilia Argue   Encounter Date: 02/27/2021   PT End of Session - 02/27/21 1655     Visit Number 4    Date for PT Re-Evaluation 04/07/21    Authorization Type medicare/medicaid    Authorization - Visit Number 4    Authorization - Number of Visits 10    PT Start Time 6759    PT Stop Time 1638    PT Time Calculation (min) 38 min    Activity Tolerance Patient tolerated treatment well    Behavior During Therapy [facility] for tasks assessed/performed             Past Medical History:  Diagnosis Date   Cancer ([place])    Breast,sential node   Chronic kidney disease    kidney stone now ct scan   Ovarian cancer ([place])    Tear of meniscus of knee joint    presently has a tear left knee    Past Surgical History:  Procedure Laterality Date   ABDOMINAL HYSTERECTOMY  08/23/2012   Procedure: HYSTERECTOMY ABDOMINAL;  Surgeon: Alvino Chapel, MD;  [facility]: [facility];  Service: Gynecology;  Laterality: N/A;  Perineal biopsies   APPENDECTOMY  AGE 51   BREAST LUMPECTOMY  2005   RIGHT   LYMPHADENECTOMY  08/23/2012   Procedure: LYMPHADENECTOMY;  Surgeon: Alvino Chapel, MD;  Location: [facility];  Service: Gynecology;;  Pelvic and para-aortic   OMENTECTOMY  08/23/2012   Procedure: OMENTECTOMY;  Surgeon: Alvino Chapel, MD;  Location: [facility];  Service: Gynecology;;   SALPINGOOPHORECTOMY  08/23/2012   Procedure: SALPINGO OOPHORECTOMY;  Surgeon: Alvino Chapel, MD;  Location: [facility];  Service: Gynecology;  Laterality: Bilateral;    There were no vitals filed for this visit.   Subjective Assessment - 02/27/21 1618     Subjective Pelvic floor is doing well and my knee is  doing good. No groin pain just a sense of weakness when I swing my left leg out of the car.    Pertinent History Breast cancer and ovarian cancer    Patient Stated Goals move and use her body like in the past    Currently in Pain? No/denies                [facility] PT Assessment - 02/27/21 0001       Assessment   Medical Diagnosis N81.89 Pelvic floor weakness in female; M25.562 Left knee pain, unspecified chronicity    Referring Provider (PT) Dr. Lilia Argue    Prior Therapy none      Precautions   Precautions Other (comment)    Precaution Comments cancer      Restrictions   Weight Bearing Restrictions No      Home Environment   Living Environment Private residence      Prior Function   Level of Independence Independent      Cognition   Overall Cognitive Status Within Functional Limits for tasks assessed      Posture/Postural Control   Posture/Postural Control No significant limitations      Strength   Left Hip Extension 4/5    Left Hip ABduction 4/5    Left Knee Flexion 4+/5  [place] Adult PT Treatment/Exercise - 02/27/21 0001       Knee/Hip Exercises: Standing   Other Standing Knee Exercises stand on left leg and lean forward with instability of the trunk      Knee/Hip Exercises: Sidelying   Other Sidelying Knee/Hip Exercises side plank with bottom knee bent and lift up the top leg      Knee/Hip Exercises: Prone   Other Prone Exercises modified plank with bringing leg outward      Manual Therapy   Manual Therapy Myofascial release;Soft tissue mobilization    Soft tissue mobilization manual mobilization to the left hamstring, around the ischial tuberosity and left SI oint    Myofascial Release using the iastim to the hamstring while pateint is prone on mat and left leg is hanging down with foot on the floor                    PT Education - 02/27/21 1647     Education Details Access Code: [email or web address]     Person(s) Educated Patient    Methods Explanation;Demonstration;Verbal cues;Handout    Comprehension Returned demonstration;Verbalized understanding              PT Short Term Goals - 02/27/21 1619       PT SHORT TERM GOAL #1   Title independent with initial HEP for stretches    Time 4    Period Weeks    Status Achieved      PT SHORT TERM GOAL #2   Title left knee pain decreased >/= 25% with daily activities    Time 4    Period Weeks    Status Achieved      PT SHORT TERM GOAL #3   Title left groin pain decreased >/= 25% due to improve mobility    Time 4    Period Weeks    Status Achieved               PT Long Term Goals - 02/27/21 1620       PT LONG TERM GOAL #1   Title independent with advanced HEP with strengthening and correcting muscle imbalances    Time 12    Period Weeks    Status On-going      PT LONG TERM GOAL #2   Title able to get in and out of the car with pain level decreased >/= 75% for hip and 50% for the knee due to improved muscle imbalances    Time 12    Period Weeks    Status Achieved      PT LONG TERM GOAL #3   Title able to go into a lunge position with left hip having less pain due to improved lengthening of the fascial structures    Baseline feels unsteady due to weakness    Time 12    Period Weeks    Status On-going      PT LONG TERM GOAL #4   Title going from the couch to standing due to increased strength of the left hip and abdominals with greater ease    Time 12    Period Weeks    Status Achieved                   Plan - 02/27/21 1640     Clinical Impression Statement Patient is not having pain but is having weakness in the left hip. She shows some instability with standing on left leg and leaning forward. Patient  has tightness in the left upper hamstring and along the medial hamstring . Patient will benefit from skilled therapy to improve left SI stabilty and release fascial restrictions.    Personal Factors  and Comorbidities Fitness;Comorbidity 3+    Comorbidities Breast cancer; ovarian cancer, Abdominal hysterectomy, appendectomy    Examination-Activity Limitations Locomotion Level    Stability/Clinical Decision Making Evolving/Moderate complexity    Rehab Potential Excellent    PT Frequency 1x / week    PT Duration 12 weeks    PT Treatment/Interventions ADLs/Self Care Home Management;Biofeedback;Electrical Stimulation;Neuromuscular re-education;Therapeutic exercise;Therapeutic activities;Patient/family education;Manual techniques;Dry needling;Scar mobilization;Spinal Manipulations;Joint Manipulations    PT Next Visit Plan work on gluteus medius and SI stability on the left; work on the left lumbar and around the ilium; one legged stability exercise and lower abdominal    PT Home Exercise Plan Access Code: [email or web address]    Consulted and Agree with Plan of Care Patient             Patient will benefit from skilled therapeutic intervention in order to improve the following deficits and impairments:  Decreased coordination,Decreased range of motion,Increased fascial restricitons,Decreased endurance,Decreased activity tolerance,Increased muscle spasms,Pain,Decreased strength,Decreased mobility,Decreased scar mobility  Visit Diagnosis: Muscle weakness (generalized)  Pain in left hip  Cramp and spasm     Problem List Patient Active Problem List   Diagnosis Date Noted   Stenosing tenosynovitis of finger of right hand 11/10/2018   Acute pain of right knee 12/06/2017   Tinnitus, bilateral 04/14/2017   Impacted cerumen of both ears 04/14/2017   Eustachian tube dysfunction, bilateral 04/14/2017   Urinary incontinence 01/23/2013   Ovarian low malignant potential tumor 09/06/2012   Breast cancer ([place]) 08/05/2012   Lateral meniscus derangement 07/20/2012    Earlie Counts, PT 02/27/21 4:56 PM   [facility] [address] [place] [address] [place], [place], [address] Phone: [phone]   Fax:  [phone]  Name: Lauren Calhoun MRN: [id number] Date of Birth: 1951/10/02  PHYSICAL THERAPY DISCHARGE SUMMARY  Visits from Start of Care: 4  Current functional level related to goals / functional outcomes: See above. Patient called to cancel her appointments due to being out of town till August. She is happy with her progress. She will get a new referral if she needs to resume therapy.    Remaining deficits: See above.    Education / Equipment: HEP   Patient agrees to discharge. Patient goals were met. Patient is being discharged due to being pleased with the current functional level. Thank you for the referral. Earlie Counts, PT 03/25/21 8:20 AM

## 2021-03-05 DIAGNOSIS — M25562 Pain in left knee: Secondary | ICD-10-CM | POA: Diagnosis not present

## 2021-03-12 ENCOUNTER — Encounter: Payer: Medicare Other | Admitting: Physical Therapy

## 2021-03-14 ENCOUNTER — Other Ambulatory Visit: Payer: Self-pay

## 2021-03-14 ENCOUNTER — Other Ambulatory Visit: Payer: Medicare Other | Admitting: Internal Medicine

## 2021-03-14 DIAGNOSIS — M8580 Other specified disorders of bone density and structure, unspecified site: Secondary | ICD-10-CM

## 2021-03-14 DIAGNOSIS — Z0000 Encounter for general adult medical examination without abnormal findings: Secondary | ICD-10-CM | POA: Diagnosis not present

## 2021-03-14 DIAGNOSIS — Z1329 Encounter for screening for other suspected endocrine disorder: Secondary | ICD-10-CM | POA: Diagnosis not present

## 2021-03-14 DIAGNOSIS — C50919 Malignant neoplasm of unspecified site of unspecified female breast: Secondary | ICD-10-CM

## 2021-03-14 DIAGNOSIS — Z853 Personal history of malignant neoplasm of breast: Secondary | ICD-10-CM | POA: Diagnosis not present

## 2021-03-14 DIAGNOSIS — E7800 Pure hypercholesterolemia, unspecified: Secondary | ICD-10-CM

## 2021-03-17 ENCOUNTER — Ambulatory Visit (INDEPENDENT_AMBULATORY_CARE_PROVIDER_SITE_OTHER): Payer: Medicare Other | Admitting: Internal Medicine

## 2021-03-17 ENCOUNTER — Encounter: Payer: Medicare Other | Admitting: Physical Therapy

## 2021-03-17 ENCOUNTER — Encounter: Payer: Self-pay | Admitting: Internal Medicine

## 2021-03-17 ENCOUNTER — Other Ambulatory Visit: Payer: Self-pay

## 2021-03-17 VITALS — BP 120/70 | HR 60 | Ht 64.5 in | Wt 142.0 lb

## 2021-03-17 DIAGNOSIS — Z8543 Personal history of malignant neoplasm of ovary: Secondary | ICD-10-CM

## 2021-03-17 DIAGNOSIS — Z853 Personal history of malignant neoplasm of breast: Secondary | ICD-10-CM

## 2021-03-17 DIAGNOSIS — E7800 Pure hypercholesterolemia, unspecified: Secondary | ICD-10-CM | POA: Diagnosis not present

## 2021-03-17 DIAGNOSIS — M8580 Other specified disorders of bone density and structure, unspecified site: Secondary | ICD-10-CM

## 2021-03-17 DIAGNOSIS — Z0000 Encounter for general adult medical examination without abnormal findings: Secondary | ICD-10-CM | POA: Diagnosis not present

## 2021-03-17 LAB — CBC WITH DIFFERENTIAL/PLATELET
Absolute Monocytes: 270 cells/uL (ref 200–950)
Basophils Absolute: 11 cells/uL (ref 0–200)
Basophils Relative: 0.3 %
Eosinophils Absolute: 90 cells/uL (ref 15–500)
Eosinophils Relative: 2.5 %
HCT: 43.3 % (ref 35.0–45.0)
Hemoglobin: 14.4 g/dL (ref 11.7–15.5)
Lymphs Abs: 1195 cells/uL (ref 850–3900)
MCH: 29.4 pg (ref 27.0–33.0)
MCHC: 33.3 g/dL (ref 32.0–36.0)
MCV: 88.4 fL (ref 80.0–100.0)
MPV: 10 fL (ref 7.5–12.5)
Monocytes Relative: 7.5 %
Neutro Abs: 2034 cells/uL (ref 1500–7800)
Neutrophils Relative %: 56.5 %
Platelets: 214 10*3/uL (ref 140–400)
RBC: 4.9 10*6/uL (ref 3.80–5.10)
RDW: 12.9 % (ref 11.0–15.0)
Total Lymphocyte: 33.2 %
WBC: 3.6 10*3/uL — ABNORMAL LOW (ref 3.8–10.8)

## 2021-03-17 LAB — COMPLETE METABOLIC PANEL WITH GFR
AG Ratio: 1.8 (calc) (ref 1.0–2.5)
ALT: 18 U/L (ref 6–29)
AST: 28 U/L (ref 10–35)
Albumin: 4.6 g/dL (ref 3.6–5.1)
Alkaline phosphatase (APISO): 81 U/L (ref 37–153)
BUN: 18 mg/dL (ref 7–25)
CO2: 29 mmol/L (ref 20–32)
Calcium: 10.8 mg/dL — ABNORMAL HIGH (ref 8.6–10.4)
Chloride: 109 mmol/L (ref 98–110)
Creat: 0.94 mg/dL (ref 0.50–0.99)
GFR, Est African American: 72 mL/min/{1.73_m2} (ref >=60)
GFR, Est Non African American: 62 mL/min/{1.73_m2} (ref >=60)
Globulin: 2.5 g/dL (calc) (ref 1.9–3.7)
Glucose, Bld: 97 mg/dL (ref 65–99)
Potassium: 5.1 mmol/L (ref 3.5–5.3)
Sodium: 146 mmol/L (ref 135–146)
Total Bilirubin: 0.9 mg/dL (ref 0.2–1.2)
Total Protein: 7.1 g/dL (ref 6.1–8.1)

## 2021-03-17 LAB — POCT URINALYSIS DIPSTICK
Appearance: NEGATIVE
Bilirubin, UA: NEGATIVE
Blood, UA: NEGATIVE
Glucose, UA: NEGATIVE
Ketones, UA: NEGATIVE
Leukocytes, UA: NEGATIVE
Nitrite, UA: NEGATIVE
Odor: NEGATIVE
Protein, UA: NEGATIVE
Spec Grav, UA: 1.015 (ref 1.010–1.025)
Urobilinogen, UA: 0.2 E.U./dL
pH, UA: 6.5 (ref 5.0–8.0)

## 2021-03-17 LAB — LIPID PANEL
Cholesterol: 221 mg/dL — ABNORMAL HIGH (ref <200)
HDL: 75 mg/dL (ref >=50)
LDL Cholesterol (Calc): 130 mg/dL (calc) — ABNORMAL HIGH
Non-HDL Cholesterol (Calc): 146 mg/dL (calc) — ABNORMAL HIGH (ref <130)
Total CHOL/HDL Ratio: 2.9 (calc) (ref <5.0)
Triglycerides: 65 mg/dL (ref <150)

## 2021-03-17 LAB — TSH: TSH: 2.36 mIU/L (ref 0.40–4.50)

## 2021-03-17 LAB — CANCER ANTIGEN 27.29: CA 27.29: 23 U/mL (ref <38)

## 2021-03-17 NOTE — Progress Notes (Signed)
Chest pain   Subjective:    Patient ID: Lauren Calhoun, female    DOB: 12-20-1950, 70 y.o.   MRN: [id number]  HPI 70 year old Female seen today for Medicare wellness and health maintenance exam.  Continues to be physically active.  Dr. Micheline Chapman referred her for PT for some generalized muscle weakness which she felt has been helpful.  She started going to PT in April.  She has history of stage Ic ovarian tumor and history of stage I right breast cancer.  She was referred here by Dr. Jana Hakim for primary care in 2012.  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  0 out of 1 sentinel lymph nodes involved.  She received Taxol/Herceptin followed by radiation all of which she completed in April 2006.  She was found to have bilateral ovarian neoplasms in 2013 with an elevated CEA of 125 by Dr. Edwinna Areola.  She underwent a total abdominal hysterectomy BSO and bilateral pelvic lymphadenectomy, periaortic lymphadenectomy, omentectomy, multiple peritoneal biopsies in November 2013.  All biopsies were negative for metastatic disease.  Pathology showed low malignant potential tumor with focal areas of microinvasion.  No adjuvant therapy was recommended.  She has been followed regularly since that time.  CA125 returned to normal.  Had colonoscopy in 2018 with 5-year follow-up recommended.  History of osteopenia with bone density study -2.3 in 2017.  Has not wanted to be on bone sparing medication.  History of bladder dysfunction seen by Dr. Wendy Poet in 2014.  No known drug allergies.  Right ankle fracture at age 83.  Appendectomy at age 66.  History of kidney stones seen on CT scan.  These have been asymptomatic.  Social history: She is married and has 3 adult daughters.  She is now retired.  Family history: Father died at age 58.  Mother died at age 69 of uterine cancer.  Sister died at  age 21 of breast cancer.  1 sister with history of hypertension.  3 adult daughters in good health.   Has been tested by Dr. Sima Matas in 2020 for complaint of mild cognitive defects but did well on testing.  Perhaps was related to chemotherapy in the past.   Review of Systems left medial meniscal tear recently seen by Dr. Micheline Chapman and Latanya Maudlin. It has improved.  No surgery is being recommended.     Objective:   Physical Exam Blood pressure 120/70 pulse 60 regular pulse oximetry 97% weight 142 pounds BMI 24.00 blood pressure 120/70.  Skin: Warm and dry.  No cervical adenopathy or thyromegaly.  No carotid bruits.  Chest is clear to auscultation.  Cardiac exam regular rate and rhythm.  Abdomen: Soft nondistended without hepatosplenomegaly masses or tenderness.  No lower extremity pitting edema.  Neuro is intact without gross focal deficits.  Affect and judgment are normal.  GYN exam deferred status post TAH/BSO today.       Assessment & Plan:  Her total cholesterol has increased from 201 to 221.  LDL cholesterol has increased from 110 to 130. She has a good HDL of 75 which is stable and triglycerides are low at 65.  She states she has not been following a low-fat diet and would like to handle with diet and exercise.  This should be repeated in about 6 months.  History of ovarian cancer with no evidence of recurrent.  Cancer antigen CA 27.29 is 23 and within  normal limits  History of breast cancer without evidence of recurrence.  History of mild leukopenia previously evaluated and felt to be benign.  White blood cell count has been stable over several years.  Elevated serum calcium at 10.8.  This is slightly higher than the normal of 10.4 and should be repeated in about 6 months with office visit  Remainder of her labs are reviewed and within normal limits.  Tends to run slightly low white blood cell count but this is stable over several years.  Glucose, BUN and creatinine are normal.   Calcium slightly elevated 10.8 and should be repeated in a few months  Health maintenance-colonoscopy due in 2023.  Has annual mammogram.  Tetanus immunization is up-to-date.  Needs to consider Prevnar 20 for pneumonia prophylaxis.  Tetanus immunization is up-to-date.  Needs to consider COVID booster.  Shingrix vaccine not on file but we will defer that until the pandemic has improved as it may cause symptoms that could be confused with COVID-19.   Subjective:   Patient presents for Medicare Annual/Subsequent preventive examination.  Review Past Medical/Family/Social: See above   Risk Factors  Current exercise habits: Lots of exercise Dietary issues discussed: He has  Cardiac risk factors: Mild hyperlipidemia  Depression Screen  (Note: if answer to either of the following is "Yes", a more complete depression screening is indicated)   Over the past two weeks, have you felt down, depressed or hopeless? No  Over the past two weeks, have you felt little interest or pleasure in doing things? No Have you lost interest or pleasure in daily life? No Do you often feel hopeless? No Do you cry easily over simple problems? No   Activities of Daily Living  In your present state of health, do you have any difficulty performing the following activities?:   Driving? No  Managing money? No  Feeding yourself? No  Getting from bed to chair? No  Climbing a flight of stairs? No  Preparing food and eating?: No  Bathing or showering? No  Getting dressed: No  Getting to the toilet? No  Using the toilet:No  Moving around from place to place: No  In the past year have you fallen or had a near fall?:No  Are you sexually active?  Yes Do you have more than one partner? No   Hearing Difficulties: No  Do you often ask people to speak up or repeat themselves? No  Do you experience ringing or noises in your ears? No  Do you have difficulty understanding soft or whispered voices? No  Do you feel that  you have a problem with memory? No Do you often misplace items? No    Home Safety:  Do you have a smoke alarm at your residence? Yes Do you have grab bars in the bathroom?  None Do you have throw rugs in your house?  He is   Cognitive Testing  Alert? Yes Normal Appearance?Yes  Oriented to person? Yes Place? Yes  Time? Yes  Recall of three objects? Yes  Can perform simple calculations? Yes  Displays appropriate judgment?Yes  Can read the correct time from a watch face?Yes   List the Names of Other Physician/Practitioners you currently use:  See referral list for the physicians patient is currently seeing.  Dr. Micheline Chapman and Dr. Rhona Raider   Review of Systems: See above   Objective:     General appearance: Pleasant and in no acute distress Head: Normocephalic, without obvious abnormality, atraumatic  Eyes: conj clear, EOMi  PEERLA  Ears: normal TM's and external ear canals both ears  Nose: Nares normal. Septum midline. Mucosa normal. No drainage or sinus tenderness.  Throat: lips, mucosa, and tongue normal; teeth and gums normal  Neck: no adenopathy, no carotid bruit, no JVD, supple, symmetrical, trachea midline and thyroid not enlarged, symmetric, no tenderness/mass/nodules  No CVA tenderness.  Lungs: clear to auscultation bilaterally  Breasts: normal appearance, no masses or tenderness Heart: regular rate and rhythm, S1, S2 normal, no murmur, click, rub or gallop  Abdomen: soft, non-tender; bowel sounds normal; no masses, no organomegaly  Musculoskeletal: ROM normal in all joints, no crepitus, no deformity, Normal muscle strengthen. Back  is symmetric, no curvature. Skin: Skin color, texture, turgor normal. No rashes or lesions  Lymph nodes: Cervical, supraclavicular, and axillary nodes normal.  Neurologic: CN 2 -12 Normal, Normal symmetric reflexes. Normal coordination and gait  Psych: Alert & Oriented x 3, Mood appear stable.    Assessment:    Annual wellness medicare  exam   Plan:    During the course of the visit the patient was educated and counseled about appropriate screening and preventive services including:   See above regarding vaccines     Patient Instructions (the written plan) was given to the patient.  Medicare Attestation  I have personally reviewed:  The patient's medical and social history  Their use of alcohol, tobacco or illicit drugs  Their current medications and supplements  The patient's functional ability including ADLs,fall risks, home safety risks, cognitive, and hearing and visual impairment  Diet and physical activities  Evidence for depression or mood disorders  The patient's weight, height, BMI, and visual acuity have been recorded in the chart. I have made referrals, counseling, and provided education to the patient based on review of the above and I have provided the patient with a written personalized care plan for preventive services.

## 2021-03-17 NOTE — Patient Instructions (Signed)
It was a pleasure to see you today.  Work on diet and continue to exercise.  In 6 months we would like to repeat fasting serum calcium and lipid panel and see you in the office.  Consider COVID booster in the near future.  Also need to consider Prevnar 20 for pneumonia prophylaxis.

## 2021-03-26 ENCOUNTER — Encounter: Payer: Medicare Other | Admitting: Physical Therapy

## 2021-04-02 ENCOUNTER — Encounter: Payer: Medicare Other | Admitting: Physical Therapy

## 2021-06-24 DIAGNOSIS — Z23 Encounter for immunization: Secondary | ICD-10-CM | POA: Diagnosis not present

## 2021-07-31 DIAGNOSIS — H40013 Open angle with borderline findings, low risk, bilateral: Secondary | ICD-10-CM | POA: Diagnosis not present

## 2021-07-31 DIAGNOSIS — H2513 Age-related nuclear cataract, bilateral: Secondary | ICD-10-CM | POA: Diagnosis not present

## 2021-07-31 DIAGNOSIS — H04123 Dry eye syndrome of bilateral lacrimal glands: Secondary | ICD-10-CM | POA: Diagnosis not present

## 2021-07-31 DIAGNOSIS — H25013 Cortical age-related cataract, bilateral: Secondary | ICD-10-CM | POA: Diagnosis not present

## 2021-08-20 DIAGNOSIS — F4312 Post-traumatic stress disorder, chronic: Secondary | ICD-10-CM | POA: Diagnosis not present

## 2021-09-02 DIAGNOSIS — F4312 Post-traumatic stress disorder, chronic: Secondary | ICD-10-CM | POA: Diagnosis not present

## 2021-09-05 ENCOUNTER — Other Ambulatory Visit: Payer: Medicare Other | Admitting: Internal Medicine

## 2021-09-05 ENCOUNTER — Other Ambulatory Visit: Payer: Self-pay

## 2021-09-05 DIAGNOSIS — E7800 Pure hypercholesterolemia, unspecified: Secondary | ICD-10-CM | POA: Diagnosis not present

## 2021-09-06 LAB — LIPID PANEL
Cholesterol: 211 mg/dL — ABNORMAL HIGH (ref <200)
HDL: 75 mg/dL (ref >=50)
LDL Cholesterol (Calc): 120 mg/dL (calc) — ABNORMAL HIGH
Non-HDL Cholesterol (Calc): 136 mg/dL (calc) — ABNORMAL HIGH (ref <130)
Total CHOL/HDL Ratio: 2.8 (calc) (ref <5.0)
Triglycerides: 65 mg/dL (ref <150)

## 2021-09-06 LAB — CALCIUM: Calcium: 9.6 mg/dL (ref 8.6–10.4)

## 2021-09-08 ENCOUNTER — Ambulatory Visit: Payer: Medicare Other | Admitting: Internal Medicine

## 2021-09-08 ENCOUNTER — Telehealth: Payer: Self-pay | Admitting: Internal Medicine

## 2021-09-08 NOTE — Telephone Encounter (Addendum)
Lauren Calhoun [phone]  Lauren Calhoun called to cancel her appointment today, she said she had seen her labs online, and everything looked okay. If you want her to still come in she will when she gets to feeling better. She did say she could do a video visit if you want.  Lauren Calhoun has been sick for the last 2 days with sore throat, cough, sneezing and ear ache. She feels she is getting better and does not need to be seen. My Note: Her cholesterol has improved some but not quite as good as a year ago. We can cancel appt for today as patient is ill. MJB, MD

## 2021-09-22 DIAGNOSIS — Z1231 Encounter for screening mammogram for malignant neoplasm of breast: Secondary | ICD-10-CM | POA: Diagnosis not present

## 2021-11-03 DIAGNOSIS — F4312 Post-traumatic stress disorder, chronic: Secondary | ICD-10-CM | POA: Diagnosis not present

## 2021-12-02 DIAGNOSIS — K1379 Other lesions of oral mucosa: Secondary | ICD-10-CM | POA: Diagnosis not present

## 2021-12-08 DIAGNOSIS — K1329 Other disturbances of oral epithelium, including tongue: Secondary | ICD-10-CM | POA: Diagnosis not present

## 2021-12-09 DIAGNOSIS — H2513 Age-related nuclear cataract, bilateral: Secondary | ICD-10-CM | POA: Diagnosis not present

## 2021-12-09 DIAGNOSIS — H5203 Hypermetropia, bilateral: Secondary | ICD-10-CM | POA: Diagnosis not present

## 2021-12-30 DIAGNOSIS — F4312 Post-traumatic stress disorder, chronic: Secondary | ICD-10-CM | POA: Diagnosis not present

## 2022-01-30 DIAGNOSIS — F4312 Post-traumatic stress disorder, chronic: Secondary | ICD-10-CM | POA: Diagnosis not present

## 2022-03-16 ENCOUNTER — Other Ambulatory Visit: Payer: Medicare Other

## 2022-03-16 DIAGNOSIS — E7800 Pure hypercholesterolemia, unspecified: Secondary | ICD-10-CM | POA: Diagnosis not present

## 2022-03-16 DIAGNOSIS — R5383 Other fatigue: Secondary | ICD-10-CM

## 2022-03-16 DIAGNOSIS — F4312 Post-traumatic stress disorder, chronic: Secondary | ICD-10-CM | POA: Diagnosis not present

## 2022-03-16 NOTE — Progress Notes (Signed)
Lab only 

## 2022-03-17 LAB — CBC WITH DIFFERENTIAL/PLATELET
Absolute Monocytes: 237 cells/uL (ref 200–950)
Basophils Absolute: 21 cells/uL (ref 0–200)
Basophils Relative: 0.7 %
Eosinophils Absolute: 90 cells/uL (ref 15–500)
Eosinophils Relative: 3 %
HCT: 40.2 % (ref 35.0–45.0)
Hemoglobin: 13.7 g/dL (ref 11.7–15.5)
Lymphs Abs: 1332 cells/uL (ref 850–3900)
MCH: 30.4 pg (ref 27.0–33.0)
MCHC: 34.1 g/dL (ref 32.0–36.0)
MCV: 89.1 fL (ref 80.0–100.0)
MPV: 10.7 fL (ref 7.5–12.5)
Monocytes Relative: 7.9 %
Neutro Abs: 1320 cells/uL — ABNORMAL LOW (ref 1500–7800)
Neutrophils Relative %: 44 %
Platelets: 216 10*3/uL (ref 140–400)
RBC: 4.51 10*6/uL (ref 3.80–5.10)
RDW: 12.9 % (ref 11.0–15.0)
Total Lymphocyte: 44.4 %
WBC: 3 10*3/uL — ABNORMAL LOW (ref 3.8–10.8)

## 2022-03-17 LAB — COMPLETE METABOLIC PANEL WITH GFR
AG Ratio: 1.7 (calc) (ref 1.0–2.5)
ALT: 13 U/L (ref 6–29)
AST: 21 U/L (ref 10–35)
Albumin: 4.1 g/dL (ref 3.6–5.1)
Alkaline phosphatase (APISO): 81 U/L (ref 37–153)
BUN: 18 mg/dL (ref 7–25)
CO2: 30 mmol/L (ref 20–32)
Calcium: 10.1 mg/dL (ref 8.6–10.4)
Chloride: 110 mmol/L (ref 98–110)
Creat: 0.94 mg/dL (ref 0.60–1.00)
Globulin: 2.4 g/dL (calc) (ref 1.9–3.7)
Glucose, Bld: 92 mg/dL (ref 65–99)
Potassium: 5.7 mmol/L — ABNORMAL HIGH (ref 3.5–5.3)
Sodium: 144 mmol/L (ref 135–146)
Total Bilirubin: 0.7 mg/dL (ref 0.2–1.2)
Total Protein: 6.5 g/dL (ref 6.1–8.1)
eGFR: 65 mL/min/{1.73_m2} (ref >=60)

## 2022-03-17 LAB — LIPID PANEL
Cholesterol: 203 mg/dL — ABNORMAL HIGH (ref <200)
HDL: 70 mg/dL (ref >=50)
LDL Cholesterol (Calc): 116 mg/dL (calc) — ABNORMAL HIGH
Non-HDL Cholesterol (Calc): 133 mg/dL (calc) — ABNORMAL HIGH (ref <130)
Total CHOL/HDL Ratio: 2.9 (calc) (ref <5.0)
Triglycerides: 78 mg/dL (ref <150)

## 2022-03-17 LAB — TSH: TSH: 2.12 mIU/L (ref 0.40–4.50)

## 2022-03-19 ENCOUNTER — Encounter: Payer: Self-pay | Admitting: Internal Medicine

## 2022-03-19 ENCOUNTER — Ambulatory Visit (INDEPENDENT_AMBULATORY_CARE_PROVIDER_SITE_OTHER): Payer: Medicare Other | Admitting: Internal Medicine

## 2022-03-19 VITALS — BP 110/80 | HR 58 | Temp 97.8°F | Ht 65.0 in | Wt 140.8 lb

## 2022-03-19 DIAGNOSIS — Z136 Encounter for screening for cardiovascular disorders: Secondary | ICD-10-CM

## 2022-03-19 DIAGNOSIS — Z853 Personal history of malignant neoplasm of breast: Secondary | ICD-10-CM | POA: Diagnosis not present

## 2022-03-19 DIAGNOSIS — E7800 Pure hypercholesterolemia, unspecified: Secondary | ICD-10-CM

## 2022-03-19 DIAGNOSIS — Z0000 Encounter for general adult medical examination without abnormal findings: Secondary | ICD-10-CM | POA: Diagnosis not present

## 2022-03-19 DIAGNOSIS — M8580 Other specified disorders of bone density and structure, unspecified site: Secondary | ICD-10-CM

## 2022-03-19 DIAGNOSIS — Z8543 Personal history of malignant neoplasm of ovary: Secondary | ICD-10-CM | POA: Diagnosis not present

## 2022-03-19 DIAGNOSIS — E875 Hyperkalemia: Secondary | ICD-10-CM | POA: Diagnosis not present

## 2022-03-19 NOTE — Progress Notes (Signed)
Annual Wellness Visit     Patient: Lauren Calhoun, Female    DOB: 1951/09/05, 71 y.o.   MRN: [id number] Visit Date: 03/19/2022  Chief Complaint  Patient presents with   Medicare Wellness   Subjective    Lauren Calhoun is a 71 y.o. female who presents today for her Annual Wellness Visit.  HPI She also presents for health maintenance exam and evaluation of medical issues.  She continues to be physically active.  She has a history of stage Ic ovarian cancer and history of stage I right breast cancer.  She was referred here by Dr. Jana Hakim for primary care in 2012.  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  0 out of 1 sentinel lymph nodes involved.  She received Taxol/Herceptin followed by radiation all of which she completed in April 2006.  She was found to have bilateral ovarian neoplasms in 2013 with an elevated CEA of 125 by Dr. Edwinna Areola.  She underwent total abdominal hysterectomy, BSO and bilateral pelvic lymphadenectomy,.  Aortic lymphadenectomy, omentectomy, multiple peritoneal biopsies in November 2013.  All biopsies were negative for metastatic disease.  Pathology showed low malignant potential tumor with focal areas of microinvasion.  No adjuvant therapy was recommended.  She has been followed regularly since that time.  CA125 returned to normal.  Had colonoscopy 2018 with 5-year follow-up recommended.  This was done by Dr. Earlean Shawl who has retired.  History of bladder dysfunction seen by Dr. Wendy Poet in 2014.  History of osteopenia with bone density study in 2017 being -2.3 and has not wanted to be on bone sparing medication.  Last bone density study was in 2017.  No known drug allergies  Additional past medical history: Right ankle fracture at age 77, appendectomy at age 17, history of kidney stones seen on CT scan that are asymptomatic.  Social  history: She is married and has 3 adult daughters.  She is now retired.  She was tested by Dr. Sima Matas in 2020 for complaint of mild cognitive defects but did well on testing.  History of left medial meniscal tear seen in 2022 by Dr. Micheline Chapman and Dr. Latanya Maudlin. This has improved and no surgery recommended.  Family history: Father died at age 22.  Mother died at age 41 of uterine cancer.  Sister died at age 18 of breast cancer.  1 sister with history of hypertension.  3 adult daughters in good health.      Patient Care Team: Elby Showers, MD as PCP - General (Internal Medicine)  Review of Systems no new complaints   Objective    Vitals: BP 98/70   Pulse (!) 58   Temp 97.8 F (36.6 C) (Tympanic)   Ht '5\' 5"'$  (1.651 m)   Wt 140 lb 12 oz (63.8 kg)   SpO2 99%   BMI 23.42 kg/m   Physical Exam Skin: Warm and dry.  No cervical adenopathy.  No thyromegaly.  No carotid bruits noted.  Her chest is clear to auscultation without rales or wheezing.  Cardiac exam reveals regular rate and rhythm without ectopy or murmurs.  Abdomen is soft nondistended without hepatosplenomegaly, masses or tenderness.  There is no lower extremity pitting edema.  Neurological exam is intact without gross focal deficits.  Affect thought and judgment appear to be normal.  GYN exam deferred status post TAH/BSO.  Most recent functional status assessment:  03/19/2022   11:07 AM  In your present state of health, do you have any difficulty performing the following activities:  Hearing? 0  Vision? 0  Difficulty concentrating or making decisions? 0  Walking or climbing stairs? 0  Dressing or bathing? 0  Doing errands, shopping? 0  Preparing Food and eating ? N  Using the Toilet? N  In the past six months, have you accidently leaked urine? N  Do you have problems with loss of bowel control? N  Managing your Medications? N  Managing your Finances? N  Housekeeping or managing your Housekeeping? N   Most recent  fall risk assessment:    03/19/2022   11:07 AM  Fall Risk   Falls in the past year? 0  Number falls in past yr: 0  Injury with Fall? 0  Risk for fall due to : No Fall Risks  Follow up Falls evaluation completed    Most recent depression screenings:    03/19/2022   11:07 AM 03/17/2021   10:06 AM  PHQ 2/9 Scores  PHQ - 2 Score 0 0   Most recent cognitive screening:    03/19/2022   11:08 AM  6CIT Screen  What Year? 0 points  What month? 0 points  What time? 0 points  Count back from 20 0 points  Months in reverse 0 points  Repeat phrase 0 points  Total Score 0 points       Assessment & Plan   She has mild elevation of LDL at 116 with total cholesterol of 203.  I recommended coronary calcium scoring.  She prefers not to be on any statin medication.  Cancer antigens ordered and are within normal limits  Tetanus immunization is up-to-date.  Pneumonia 20 vaccine discussed.  Has not had Shingrix vaccine to my knowledge.  Colonoscopy is due.  Return in 1 year or as needed.          Annual wellness visit done today including the all of the following: Reviewed patient's Family Medical History Reviewed and updated list of patient's medical providers Assessment of cognitive impairment was done Assessed patient's functional ability Established a written schedule for health screening [place] Completed and Reviewed  Discussed health benefits of physical activity, and encouraged her to engage in regular exercise appropriate for her age and condition.         IElby Showers, MD, have reviewed all documentation for this visit. The documentation on 04/26/22 for the exam, diagnosis, procedures, and orders are all accurate and complete.   Angus Seller, CMA

## 2022-03-20 LAB — CANCER ANTIGEN 27.29: CA 27.29: 17 U/mL (ref <38)

## 2022-03-20 LAB — CA 125: CA 125: 12 U/mL (ref <35)

## 2022-03-20 LAB — POTASSIUM: Potassium: 4.9 mmol/L (ref 3.5–5.3)

## 2022-04-26 NOTE — Patient Instructions (Addendum)
It was a pleasure to see you today.  Coronary calcium score ordered.  Continue diet and exercise regimen and follow-up in 1 year or as needed.  Tetanus immunization is up-to-date.  Have discussed new Pneumonia 20 vaccine.  Has not had Shingrix vaccine to my knowledge.  Colonoscopy is due.  Patient requests cancer antigens be drawn although we believe she is cured.

## 2022-04-27 ENCOUNTER — Other Ambulatory Visit: Payer: Self-pay

## 2022-04-27 DIAGNOSIS — Z1211 Encounter for screening for malignant neoplasm of colon: Secondary | ICD-10-CM

## 2022-04-27 DIAGNOSIS — Z136 Encounter for screening for cardiovascular disorders: Secondary | ICD-10-CM

## 2022-04-29 DIAGNOSIS — F4312 Post-traumatic stress disorder, chronic: Secondary | ICD-10-CM | POA: Diagnosis not present

## 2022-05-14 ENCOUNTER — Ambulatory Visit (HOSPITAL_BASED_OUTPATIENT_CLINIC_OR_DEPARTMENT_OTHER)
Admission: RE | Admit: 2022-05-14 | Discharge: 2022-05-14 | Disposition: A | Discharge status: Home / Self Care | Payer: Medicare Other | Source: Ambulatory Visit | Attending: Internal Medicine | Admitting: Internal Medicine

## 2022-05-14 ENCOUNTER — Encounter (HOSPITAL_BASED_OUTPATIENT_CLINIC_OR_DEPARTMENT_OTHER): Payer: Self-pay

## 2022-05-14 DIAGNOSIS — Z136 Encounter for screening for cardiovascular disorders: Secondary | ICD-10-CM | POA: Insufficient documentation

## 2022-05-18 NOTE — Progress Notes (Signed)
Called and spoke with patient and she was okay with placing referral to cardiology.

## 2022-05-25 DIAGNOSIS — F4312 Post-traumatic stress disorder, chronic: Secondary | ICD-10-CM | POA: Diagnosis not present

## 2022-06-10 ENCOUNTER — Emergency Department (HOSPITAL_BASED_OUTPATIENT_CLINIC_OR_DEPARTMENT_OTHER): Payer: Medicare Other

## 2022-06-10 ENCOUNTER — Emergency Department (HOSPITAL_BASED_OUTPATIENT_CLINIC_OR_DEPARTMENT_OTHER)
Admission: EM | Admit: 2022-06-10 | Discharge: 2022-06-10 | Disposition: A | Discharge status: Home / Self Care | Payer: Medicare Other | Attending: Emergency Medicine | Admitting: Emergency Medicine

## 2022-06-10 ENCOUNTER — Other Ambulatory Visit: Payer: Self-pay

## 2022-06-10 ENCOUNTER — Other Ambulatory Visit (HOSPITAL_BASED_OUTPATIENT_CLINIC_OR_DEPARTMENT_OTHER): Payer: Self-pay

## 2022-06-10 ENCOUNTER — Telehealth: Payer: Self-pay | Admitting: Internal Medicine

## 2022-06-10 ENCOUNTER — Encounter (HOSPITAL_BASED_OUTPATIENT_CLINIC_OR_DEPARTMENT_OTHER): Payer: Self-pay | Admitting: Emergency Medicine

## 2022-06-10 DIAGNOSIS — Z8543 Personal history of malignant neoplasm of ovary: Secondary | ICD-10-CM | POA: Diagnosis not present

## 2022-06-10 DIAGNOSIS — I6523 Occlusion and stenosis of bilateral carotid arteries: Secondary | ICD-10-CM | POA: Diagnosis not present

## 2022-06-10 DIAGNOSIS — N189 Chronic kidney disease, unspecified: Secondary | ICD-10-CM | POA: Insufficient documentation

## 2022-06-10 DIAGNOSIS — R209 Unspecified disturbances of skin sensation: Secondary | ICD-10-CM | POA: Diagnosis not present

## 2022-06-10 DIAGNOSIS — Z7982 Long term (current) use of aspirin: Secondary | ICD-10-CM | POA: Insufficient documentation

## 2022-06-10 DIAGNOSIS — R001 Bradycardia, unspecified: Secondary | ICD-10-CM | POA: Diagnosis not present

## 2022-06-10 DIAGNOSIS — H532 Diplopia: Secondary | ICD-10-CM | POA: Insufficient documentation

## 2022-06-10 DIAGNOSIS — R200 Anesthesia of skin: Secondary | ICD-10-CM | POA: Diagnosis not present

## 2022-06-10 LAB — COMPREHENSIVE METABOLIC PANEL
ALT: 12 U/L (ref 0–44)
AST: 22 U/L (ref 15–41)
Albumin: 4.6 g/dL (ref 3.5–5.0)
Alkaline Phosphatase: 75 U/L (ref 38–126)
Anion gap: 8 (ref 5–15)
BUN: 15 mg/dL (ref 8–23)
CO2: 26 mmol/L (ref 22–32)
Calcium: 9.9 mg/dL (ref 8.9–10.3)
Chloride: 107 mmol/L (ref 98–111)
Creatinine, Ser: 0.97 mg/dL (ref 0.44–1.00)
GFR, Estimated: >60 mL/min (ref >60)
Glucose, Bld: 101 mg/dL — ABNORMAL HIGH (ref 70–99)
Potassium: 3.8 mmol/L (ref 3.5–5.1)
Sodium: 141 mmol/L (ref 135–145)
Total Bilirubin: 0.8 mg/dL (ref 0.3–1.2)
Total Protein: 7.3 g/dL (ref 6.5–8.1)

## 2022-06-10 LAB — CBC WITH DIFFERENTIAL/PLATELET
Abs Immature Granulocytes: 0.01 10*3/uL (ref 0.00–0.07)
Basophils Absolute: 0 10*3/uL (ref 0.0–0.1)
Basophils Relative: 0 %
Eosinophils Absolute: 0.1 10*3/uL (ref 0.0–0.5)
Eosinophils Relative: 2 %
HCT: 41.2 % (ref 36.0–46.0)
Hemoglobin: 13.8 g/dL (ref 12.0–15.0)
Immature Granulocytes: 0 %
Lymphocytes Relative: 35 %
Lymphs Abs: 1.3 10*3/uL (ref 0.7–4.0)
MCH: 29.9 pg (ref 26.0–34.0)
MCHC: 33.5 g/dL (ref 30.0–36.0)
MCV: 89.2 fL (ref 80.0–100.0)
Monocytes Absolute: 0.3 10*3/uL (ref 0.1–1.0)
Monocytes Relative: 7 %
Neutro Abs: 2 10*3/uL (ref 1.7–7.7)
Neutrophils Relative %: 56 %
Platelets: 201 10*3/uL (ref 150–400)
RBC: 4.62 MIL/uL (ref 3.87–5.11)
RDW: 12.9 % (ref 11.5–15.5)
WBC: 3.6 10*3/uL — ABNORMAL LOW (ref 4.0–10.5)
nRBC: 0 % (ref 0.0–0.2)

## 2022-06-10 MED ORDER — ASPIRIN 81 MG PO TBEC
81.0000 mg | DELAYED_RELEASE_TABLET | Freq: Every day | ORAL | 0 refills | Status: DC
  Filled 2022-06-10: qty 30, 30d supply, fill #0

## 2022-06-10 MED ORDER — IOHEXOL 350 MG/ML SOLN
80.0000 mL | Freq: Once | INTRAVENOUS | Status: AC | PRN
  Administered 2022-06-10: 75 mL via INTRAVENOUS

## 2022-06-10 MED ORDER — ASPIRIN 81 MG PO TBEC
81.0000 mg | DELAYED_RELEASE_TABLET | Freq: Every day | ORAL | 12 refills | Status: DC
  Filled 2022-06-10: qty 30, 30d supply, fill #0

## 2022-06-10 NOTE — ED Notes (Signed)
Reviewed AVS/discharge instruction with patient. Time allotted for and all questions answered. Patient is agreeable for d/c and escorted to ed exit by staff.  

## 2022-06-10 NOTE — Discharge Instructions (Signed)
We discussed your case with our neurology team.  They recommended that we get CT angiogram of your head and neck, to make sure there is no concerning findings.  CT angiogram looks reassuring.  The neck step is for you to call the neurology team at the number provided to set up an outpatient appointment.  If you start having repeat binocular diplopia that is persistent, please come to the emergency room immediately.

## 2022-06-10 NOTE — ED Provider Notes (Signed)
  Physical Exam  BP (!) 144/98   Pulse (!) 49   Temp 97.6 F (36.4 C) (Axillary) Comment: Unable to do oral temp, due to pt drinking iced water  Resp 16   Ht '5\' 5"'$  (1.651 m)   Wt 63.5 kg   SpO2 100%   BMI 23.30 kg/m     Procedures  Procedures  ED Course / MDM    Medical Decision Making Amount and/or Complexity of Data Reviewed Labs: ordered. Radiology: ordered.  Risk OTC drugs. Prescription drug management.   71 year old female presenting to the emergency [place] with blurred vision and numbness that started today.  The patient had a 1 minute episode of left-handed numbness and double vision while driving on the freeway around 7:30 AM.  She is back to her baseline.  Signout received from Dr. Kathrynn Humble at 1500.  Patient has a low ABCD 2 score, plan for CTA imaging and if negative, follow-up outpatient with neurology for TIA work-up. ABCD2 score of 2, low risk.  Patient CTA results are as follows: IMPRESSION:  Limited study without evidence of emergent large vessel occlusion,  proximal hemodynamically significant stenosis, or dissection.   Discussed with Dr. Malen Gauze. Stable for discharge with outpatient neurology follow-up.       Regan Lemming, MD 06/10/22 [phone]

## 2022-06-10 NOTE — ED Triage Notes (Signed)
Pt arrives to ED with c/o blurred vision and numbness that started today. Pt reports a 1 minute episode of left handed numbness and double vision while driving on the freeway around 730am.  She reports she is back at her baseline. She reports this is the third time this has happened over the past two weeks.

## 2022-06-10 NOTE — Telephone Encounter (Signed)
Noella Kipnis [phone]  Sherl called to say on 05/26/22, 05/31/2022 she has a spell of double vision only lasted about 30 seconds, she had another spell this morning while driving lasted a little longer around 7:15 and she had  some numbness in her left fingers and hand she thinks. She has an appointment with heart doctor next week but she is worried what she should do today if anything.

## 2022-06-10 NOTE — Telephone Encounter (Signed)
Called patient and let her know that Dr Renold Genta felt like she should go to Ed for an evaluation, she verbalized understanding

## 2022-06-11 ENCOUNTER — Ambulatory Visit (INDEPENDENT_AMBULATORY_CARE_PROVIDER_SITE_OTHER): Payer: Medicare Other | Admitting: Neurology

## 2022-06-11 ENCOUNTER — Telehealth: Payer: Self-pay | Admitting: Neurology

## 2022-06-11 VITALS — BP 112/81 | HR 60 | Ht 65.0 in | Wt 140.0 lb

## 2022-06-11 DIAGNOSIS — I21A9 Other myocardial infarction type: Secondary | ICD-10-CM

## 2022-06-11 DIAGNOSIS — H532 Diplopia: Secondary | ICD-10-CM

## 2022-06-11 DIAGNOSIS — G459 Transient cerebral ischemic attack, unspecified: Secondary | ICD-10-CM

## 2022-06-11 MED ORDER — ATORVASTATIN CALCIUM 40 MG PO TABS: 40.0000 mg | ORAL_TABLET | Freq: Every day | ORAL | 1 refills | Status: DC

## 2022-06-11 NOTE — Telephone Encounter (Signed)
medicare/[employer] sup NPR sent to GI  

## 2022-06-11 NOTE — Progress Notes (Signed)
[facility] [address] [place]. [place] [address] [phone]       OFFICE CONSULT NOTE  [name] Date of Birth:  August 11, 1951 Medical Record Number:  [id number]   Referring MD:  Varney Biles  Reason for Referral: Diplopia  HPI: Lauren Calhoun is a 71 year old pleasant Caucasian lady seen today for initial office consultation visit for diplopia.  History is obtained from the patient and review of electronic medical records and opossum reviewed pertinent available imaging films in PACS.  She has a past medical history of chronic kidney disease, ovarian and breast cancer and meniscal tear.  She presented to the ER on yesterday 06/10/2022 with sudden onset of diplopia and blurred vision while driving.  She also complained of some numbness in the left hand.  This lasted barely a minute.  She was able to pull over the car and stop on the side and the symptoms disappeared.  She is not sure if the numbness in the fingertips was because she was anxious and clenching the steering wheel very hard.  She denies any accompanying headache, speech difficulty, trouble with gait balance or extremity weakness.  Patient states she had similar transient episode August 22 this year when she was in the kitchen and can vegetables when she had diplopia and seeing 2 images horizontally this lasted only 30 seconds.  The images appear to also have a background of white light.  There is no accompanying headache before during or after this.  Her third episode occurred on August 22 which was similar and lasted only 30 seconds.  She is unable to any for specific triggers for any of these factors.  She denies any underlying stress.  She has no prior history of headache, eye pain, redness or burning in her eyes.  She has cataracts and has an appointment to see ophthalmologist next week.  She is presently retired but is quite active does a lot of yoga and walking.  She has no prior history of strokes, TIAs,  seizures, migraines or significant neurological problems.  ROS:   14 system review of systems is positive for vision difficulties, double vision, numbness in the fingers and all other systems negative  PMH:  Past Medical History:  Diagnosis Date   Cancer ([place])    Breast,sential node   Chronic kidney disease    kidney stone now ct scan   Ovarian cancer ([place])    Tear of meniscus of knee joint    presently has a tear left knee    Social History:  Social History   Socioeconomic History   Marital status: Married    Spouse name: Not on file   Number of children: Not on file   Years of education: Not on file   Highest education level: Not on file  Occupational History   Not on file  Tobacco Use   Smoking status: Never   Smokeless tobacco: Never  Vaping Use   Vaping Use: Never used  Substance and Sexual Activity   Alcohol use: Yes    Comment: RARE MAYBE 2X MONTH   Drug use: No   Sexual activity: Yes  Other Topics Concern   Not on file  Social History Narrative   Not on file   Social Determinants of Health   Financial Resource Strain: Not on file  Food Insecurity: Not on file  Transportation Needs: Not on file  Physical Activity: Not on file  Stress: Not on file  Social Connections: Not on file  Intimate Partner Violence: Not on file    Medications:   Current Outpatient Medications on File Prior to Visit  Medication Sig Dispense Refill   aspirin EC 81 MG tablet Take 1 tablet (81 mg total) by mouth daily. Swallow whole. 30 tablet 0   No current facility-administered medications on file prior to visit.    Allergies:  No Known Allergies  Physical Exam General: well developed, well nourished, seated, in no evident distress Head: head normocephalic and atraumatic.   Neck: supple with no carotid or supraclavicular bruits Cardiovascular: regular rate and rhythm, no murmurs Musculoskeletal: no deformity Skin:  no rash/petichiae Vascular:  Normal pulses all  extremities  Neurologic Exam Mental Status: Awake and fully alert. Oriented to place and time. Recent and remote memory intact. Attention span, concentration and fund of knowledge appropriate. Mood and affect appropriate.  Cranial Nerves: Fundoscopic exam reveals sharp disc margins. Pupils equal, briskly reactive to light. Extraocular movements full without nystagmus. Visual fields full to confrontation. Hearing intact. Facial sensation intact. Face, tongue, palate moves normally and symmetrically.  Motor: Normal bulk and tone. Normal strength in all tested extremity muscles. Sensory.: intact to touch , pinprick , position and vibratory sensation.  Coordination: Rapid alternating movements normal in all extremities. Finger-to-nose and heel-to-shin performed accurately bilaterally. Gait and Station: Arises from chair without difficulty. Stance is normal. Gait demonstrates normal stride length and balance . Able to heel, toe and tandem walk without difficulty.  Reflexes: 1+ and symmetric. Toes downgoing.   NIHSS 0 Modified [place] 0   ASSESSMENT: 71 year old pleasant Caucasian lady with recurrent episodes of transient vision difficulties due to horizontal diplopia of unclear etiology.  Possibilities include TIA versus structural lesions and involving the eyes or the orbits.  Complicated migraine will be less likely given absence of any prior migraines or accompanying headaches     PLAN: I had a long discussion with the patient regarding her recurrent transient episodes of isolated diplopia and discussed differential diagnosis and plan for evaluation and answered questions.  I recommend we check MRI scan of the brain and orbits with and without contrast to rule out any structural etiologies.  Check hemoglobin A1c, ESR and ANA panel.  I recommend she stay on aspirin 81 mg daily for stroke prevention and also add Lipitor 40 mg daily given her high coronary calcium score for cardiac and stroke  prevention.  She was encouraged to keep her upcoming appointment with a cardiologist for further cardiac risk stratification.  She will return for follow-up in the future with me in 3 months or call earlier if necessary.  Than 50% time during this 45-minute consultation visit was spent on counseling and coordination of care about her episodes of transient diplopia and discussion about differential diagnosis, evaluation and answering questions. Antony Contras, MD  Note: This document was prepared with digital dictation and possible smart phrase technology. Any transcriptional errors that result from this process are unintentional.

## 2022-06-11 NOTE — Patient Instructions (Signed)
I had a long discussion with the patient regarding her recurrent transient episodes of isolated diplopia and discussed differential diagnosis and plan for evaluation and answered questions.  I recommend we check MRI scan of the brain and orbits with and without contrast to rule out any structural etiologies.  Check hemoglobin A1c, ESR and ANA panel.  I recommend she stay on aspirin 81 mg daily for stroke prevention and also add Lipitor 40 mg daily given her high coronary calcium score for cardiac and stroke prevention.  She was encouraged to keep her upcoming appointment with a cardiologist for further cardiac risk stratification.  She will return for follow-up in the future with me in 3 months or call earlier if necessary.

## 2022-06-12 LAB — HEMOGLOBIN A1C
Est. average glucose Bld gHb Est-mCnc: 111 mg/dL
Hgb A1c MFr Bld: 5.5 % (ref 4.8–5.6)

## 2022-06-12 LAB — SEDIMENTATION RATE: Sed Rate: 2 mm/hr (ref 0–40)

## 2022-06-12 LAB — ANA: Anti Nuclear Antibody (ANA): POSITIVE — AB

## 2022-06-12 NOTE — Progress Notes (Signed)
Kindly inform the patient that screening test for diabetes and ESR were both normal.  Blood test for lupus is not back yet

## 2022-06-13 ENCOUNTER — Ambulatory Visit
Admission: RE | Admit: 2022-06-13 | Discharge: 2022-06-13 | Disposition: A | Discharge status: Home / Self Care | Payer: Medicare Other | Source: Ambulatory Visit | Attending: Neurology | Admitting: Neurology

## 2022-06-13 DIAGNOSIS — H532 Diplopia: Secondary | ICD-10-CM

## 2022-06-13 DIAGNOSIS — G459 Transient cerebral ischemic attack, unspecified: Secondary | ICD-10-CM

## 2022-06-13 MED ORDER — GADOBENATE DIMEGLUMINE 529 MG/ML IV SOLN
13.0000 mL | Freq: Once | INTRAVENOUS | Status: AC | PRN
Start: 2022-06-13 — End: 2022-06-13
  Administered 2022-06-13: 13 mL via INTRAVENOUS

## 2022-06-15 ENCOUNTER — Other Ambulatory Visit: Payer: Self-pay | Admitting: Neurology

## 2022-06-15 DIAGNOSIS — R768 Other specified abnormal immunological findings in serum: Secondary | ICD-10-CM

## 2022-06-16 ENCOUNTER — Telehealth: Payer: Self-pay

## 2022-06-16 DIAGNOSIS — H532 Diplopia: Secondary | ICD-10-CM | POA: Diagnosis not present

## 2022-06-16 NOTE — Telephone Encounter (Signed)
I called patient.  I discussed her MRI brain results and her lab work.  Patient has a cardiology appointment tomorrow at a [facility] health facility.  She will try to have that lab drawn at that office.  If not, she will come to our office for that lab to be drawn.  Patient verbalized understanding of results and had no further questions or concerns at this time.

## 2022-06-16 NOTE — Telephone Encounter (Signed)
-----  Message from Britt Bottom, MD sent at 06/15/2022  4:51 PM EDT ----- Please let the patient know that the MRI of the brain and optic nerves was normal for age.  The 1 blood test (ANA) was abnormal but a related blood tests (ESR) was negative.  I would like to have her get a more detailed test to look for autoimmune issues.  I placed the order for the Ena with antibodies and she can come into the office to get it drawn (if she prefers a [facility] site, please reorder that test for [facility].) ----- Message ----- From: Anda Latina, RN Sent: 06/15/2022   2:41 PM EDT To: Britt Bottom, MD  Could you review for Dr. Leonie Man while out of the clinic. He is on vacation the next two weeks.  ----- Message ----- From: Penni Bombard, MD Sent: 06/14/2022  10:45 PM EDT To: Garvin Fila, MD

## 2022-06-17 ENCOUNTER — Encounter (HOSPITAL_BASED_OUTPATIENT_CLINIC_OR_DEPARTMENT_OTHER): Payer: Self-pay | Admitting: Cardiology

## 2022-06-17 ENCOUNTER — Ambulatory Visit (INDEPENDENT_AMBULATORY_CARE_PROVIDER_SITE_OTHER): Payer: Medicare Other | Admitting: Cardiology

## 2022-06-17 VITALS — BP 136/86 | HR 64 | Ht 65.0 in | Wt 139.5 lb

## 2022-06-17 DIAGNOSIS — I2510 Atherosclerotic heart disease of native coronary artery without angina pectoris: Secondary | ICD-10-CM | POA: Diagnosis not present

## 2022-06-17 DIAGNOSIS — Z79899 Other long term (current) drug therapy: Secondary | ICD-10-CM | POA: Diagnosis not present

## 2022-06-17 DIAGNOSIS — Z1389 Encounter for screening for other disorder: Secondary | ICD-10-CM | POA: Diagnosis not present

## 2022-06-17 DIAGNOSIS — Z712 Person consulting for explanation of examination or test findings: Secondary | ICD-10-CM | POA: Diagnosis not present

## 2022-06-17 DIAGNOSIS — Z7189 Other specified counseling: Secondary | ICD-10-CM | POA: Diagnosis not present

## 2022-06-17 DIAGNOSIS — Z8673 Personal history of transient ischemic attack (TIA), and cerebral infarction without residual deficits: Secondary | ICD-10-CM | POA: Diagnosis not present

## 2022-06-17 MED ORDER — ROSUVASTATIN CALCIUM 5 MG PO TABS: 5.0000 mg | ORAL_TABLET | Freq: Every day | ORAL | 3 refills | Status: DC

## 2022-06-17 NOTE — Patient Instructions (Addendum)
Medication Instructions:  START: Rosuvastatin 5 mg daily  *If you need a refill on your cardiac medications before your next appointment, please call your pharmacy*   Lab Work: Your provider has recommended lab work December, 2023 (fasting lipid). Please have this collected at [facility] [place]. The lab is open 8:00 am - 4:30 pm. Please avoid 12:00p - 1:00p for lunch hour. You do not need an appointment. Please go to [address] [place], [place] [address]. This is in the Primary Care office on the 3rd floor, let them know you are there for blood work and they will direct you to the lab.    Testing/Procedures: None ordered today   Follow-Up: At [facility], you and your health needs are our priority.  As part of our continuing mission to provide you with exceptional heart care, we have created designated Provider Care Teams.  These Care Teams include your primary Cardiologist (physician) and Advanced Practice Providers (APPs -  Physician Assistants and Nurse Practitioners) who all work together to provide you with the care you need, when you need it.  We recommend signing up for the patient portal called "MyChart".  Sign up information is provided on this After Visit Summary.  MyChart is used to connect with patients for Virtual Visits (Telemedicine).  Patients are able to view lab/test results, encounter notes, upcoming appointments, etc.  Non-urgent messages can be sent to your provider as well.   To learn more about what you can do with MyChart, go to [email or web address].    Your next appointment:   1 year(s)  The format for your next appointment:   In Person  Provider:   Buford Dresser, MD     Recent data, summarized from [place] from a study from the [facility]:  https://wilkins.info/  "Researchers at  the [facility] set out to answer this question by comparing statins to supplements in a clinical trial. They tracked the outcomes of 190 adults, ages 21 to 53. Some participants were given a 5 mg daily dose of rosuvastatin, a statin that is sold under the brand name Crestor for 28 days. Others were given supplements, including fish oil, cinnamon, garlic, turmeric, plant sterols or red yeast rice for the same period."  "What we found was that rosuvastatin lowered LDL cholesterol by almost 38% and that was vastly superior to placebo and any of the six supplements studied in the trial," study Despina Hidden, M.D. of the [facility], [place] told NPR. He says this level of reduction is enough to lower the risk of heart attacks and strokes. The findings are published in the Journal of the [employer] of Cardiology.  "Oftentimes these supplements are marketed as 'natural ways' to lower your cholesterol," says Laffin. But he says none of the dietary supplements demonstrated any significant decrease in LDL cholesterol compared with a placebo. LDL cholesterol is considered the 'bad cholesterol' because it can contribute to plaque build-up in the artery walls - which can narrow the arteries, and set the stage for heart attacks and strokes"

## 2022-06-17 NOTE — Progress Notes (Signed)
Cardiology Office Note:    Date:  06/17/2022   ID:  Lauren Calhoun, DOB Sep 18, 1951, MRN [id number]  PCP:  Elby Showers, MD  Cardiologist:  Buford Dresser, MD  Referring MD: Elby Showers, MD   CC: new patient evaluation for abnormal cardiac CT  History of Present Illness:    Lauren Calhoun is a 71 y.o. female with a hx of chronic kidney disease, cancer, who is seen as a new consult at the request of Baxley, Cresenciano Lick, MD for the evaluation and management of abnormal cardiac CT screening.  She presented to the ED on 06/10/2022 for blurred vision and numbness. She reported having a 1 minute episode of left-handed numbness and double vision that day. She was found to have a low ABCD 2 score. CTA imaging revealed no evidence of emergent large vessel occlusion, proximal hemodynamically significant stenosis, or dissection. She was discharged with recommended neurology follow-up.   Today:  She appears to be doing well. She described in more detail her double vision episode that led to her ED visit on 06/10/2022. She states she has not had one of these episodes since.  Her blood pressures for the past few years have been "bouncing" according to her PCP. She has been successful with managing her blood pressures in the last year.   She performs heavy duty yoga and swims for a lot of her exercise.  She works on maintaining a healthy lifestyle overall, with weight loss, exercise, and her diet.  She denies any palpitations, chest pain, shortness of breath, or peripheral edema. No lightheadedness, headaches, syncope, orthopnea, or PND.   She had a paternal aunt who had a stroke in her 72. She has some family history of obesity on both sides, as well as some feminine cancer on her maternal side.  Past Medical History:  Diagnosis Date   Cancer ([place])    Breast,sential node   Chronic kidney disease    kidney stone now ct scan   Ovarian cancer ([place])    Tear of meniscus of knee joint     presently has a tear left knee    Past Surgical History:  Procedure Laterality Date   ABDOMINAL HYSTERECTOMY  08/23/2012   Procedure: HYSTERECTOMY ABDOMINAL;  Surgeon: Alvino Chapel, MD;  Location: [facility];  Service: Gynecology;  Laterality: N/A;  Perineal biopsies   APPENDECTOMY  AGE 18   BREAST LUMPECTOMY  2005   RIGHT   LYMPHADENECTOMY  08/23/2012   Procedure: LYMPHADENECTOMY;  Surgeon: Alvino Chapel, MD;  Location: [facility];  Service: Gynecology;;  Pelvic and para-aortic   OMENTECTOMY  08/23/2012   Procedure: OMENTECTOMY;  Surgeon: Alvino Chapel, MD;  Location: [facility];  Service: Gynecology;;   SALPINGOOPHORECTOMY  08/23/2012   Procedure: SALPINGO OOPHORECTOMY;  Surgeon: Alvino Chapel, MD;  Location: [facility];  Service: Gynecology;  Laterality: Bilateral;    Current Medications: Current Outpatient Medications on File Prior to Visit  Medication Sig   aspirin EC 81 MG tablet Take 1 tablet (81 mg total) by mouth daily. Swallow whole.   atorvastatin (LIPITOR) 40 MG tablet Take 1 tablet (40 mg total) by mouth daily.   No current facility-administered medications on file prior to visit.     Allergies:   Patient has no known allergies.   Social History   Tobacco Use   Smoking status: Never   Smokeless tobacco: Never  Vaping Use   Vaping Use: Never used  Substance Use Topics  Alcohol use: Yes    Comment: RARE MAYBE 2X MONTH   Drug use: No    Family History: family history includes Cancer in her maternal aunt, mother, and sister.  ROS:   Please see the history of present illness.  Additional pertinent ROS: Constitutional: Negative for chills, fever, night sweats, unintentional weight loss  HENT: Negative for ear pain and hearing loss.   Eyes: Negative for loss of vision and eye pain.  Respiratory: Negative for cough, sputum, wheezing.   Cardiovascular: See HPI. Gastrointestinal: Negative for abdominal pain, melena, and hematochezia.   Genitourinary: Negative for dysuria and hematuria.  Musculoskeletal: Negative for falls and myalgias.  Skin: Negative for itching and rash.  Neurological: Negative for focal weakness, focal sensory changes and loss of consciousness.  Endo/Heme/Allergies: Does not bruise/bleed easily.     EKGs/Labs/Other Studies Reviewed:    The following studies were reviewed today: Calcium score 05/15/22 FINDINGS: Coronary arteries: Normal origins.   Coronary Calcium Score:  Left main: 0  Left anterior descending artery: 143  Left circumflex artery: 1  Right coronary artery: 23  Total: 167  Percentile: 79   Pericardium: Normal.  Ascending Aorta: Normal caliber.  Non-cardiac: See separate report from [facility].   IMPRESSION: Coronary calcium score of 167. This was 76 percentile for age-, race-, and sex-matched controls.    EKG:  EKG is personally reviewed.   06/17/2022: not ordered 06/10/22: sinus bradycardia at 53 bpm  Recent Labs: 03/16/2022: TSH 2.12 06/10/2022: ALT 12; BUN 15; Creatinine, Ser 0.97; Hemoglobin 13.8; Platelets 201; Potassium 3.8; Sodium 141  Recent Lipid Panel    Component Value Date/Time   CHOL 203 (H) 03/16/2022 1124   TRIG 78 03/16/2022 1124   HDL 70 03/16/2022 1124   CHOLHDL 2.9 03/16/2022 1124   VLDL 15 12/28/2016 1136   LDLCALC 116 (H) 03/16/2022 1124    Physical Exam:    VS:  BP 136/86 (BP Location: Left Arm, Patient Position: Sitting, Cuff Size: Normal)   Pulse 64   Ht '5\' 5"'$  (1.651 m)   Wt 139 lb 8 oz (63.3 kg)   BMI 23.21 kg/m     Wt Readings from Last 3 Encounters:  06/17/22 139 lb 8 oz (63.3 kg)  06/11/22 140 lb (63.5 kg)  06/10/22 140 lb (63.5 kg)    GEN: Well nourished, well developed in no acute distress HEENT: Normal, moist mucous membranes NECK: No JVD CARDIAC: regular rhythm, normal S1 and S2, no rubs or gallops. No murmur. VASCULAR: Radial and DP pulses 2+ bilaterally. No carotid bruits RESPIRATORY:  Clear to auscultation  without rales, wheezing or rhonchi  ABDOMEN: Soft, non-tender, non-distended MUSCULOSKELETAL:  Ambulates independently SKIN: Warm and dry, no edema NEUROLOGIC:  Alert and oriented x 3. No focal neuro deficits noted. PSYCHIATRIC:  Normal affect    ASSESSMENT:    1. Coronary artery calcification seen on CT scan   2. Nonocclusive coronary atherosclerosis of native coronary artery   3. Encounter to discuss test results   4. History of transient ischemic attack (TIA)   5. Cardiac risk counseling   6. Counseling on health promotion and disease prevention   7. Medication management    PLAN:    Coronary calcification on CT, consistent with nonobstructive CAD History of TIA -We reviewed the calcium score at length, including actual images as well as the graph showing mortality based on calcium score. We discussed the pathophysiology of cholesterol plaque formation, the role of calcium and why it is a marker,  how plaque is key to acute MI/CVA, and how known plaque is managed with medications.   -we discussed statins at length. She was prescribed atorvastatin by her PCP but has not yet started. We talked about options. After shared decision making, will start very low dose rosuvastatin 5 mg daily and recheck lipids after several months. If LDL not <70, will then increase to 10 mg rosuvastatin and titrate up -we discussed the recent study that showed supplements aimed at cholesterol did not have any benefit.  Addended to add: she called the office on 06/19/22 noting concern for medication side effects. She took rosuvastatin and 45 min later noted spaciness and inability to concentrate. Noted headache. Took a walk and did ok. Concerned this was a side effect from the rosuvastatin. We recommended stopping the rosuvastatin and discussing with her PCP, as this is not one of the more common side effects of rosuvastatin, though it is a possibility. With her history of TIA, need to make sure there is not  another etiology. Offered to discuss alternatives at her convenience.  Cardiac risk counseling and prevention recommendations: -recommend heart healthy/Mediterranean diet, with whole grains, fruits, vegetable, fish, lean meats, nuts, and olive oil. Limit salt. -recommend moderate walking, 3-5 times/week for 30-50 minutes each session. Aim for at least 150 minutes.week. Goal should be pace of 3 miles/hours, or walking 1.5 miles in 30 minutes -recommend avoidance of tobacco products. Avoid excess alcohol.  Plan for follow up: initially 1 year, but will see back sooner to discuss alternatives given symptoms on crestor  Buford Dresser, MD, PhD, [facility]    Medication Adjustments/Labs and Tests Ordered: Current medicines are reviewed at length with the patient today.  Concerns regarding medicines are outlined above.  Orders Placed This Encounter  Procedures   Lipid panel   Meds ordered this encounter  Medications   rosuvastatin (CRESTOR) 5 MG tablet    Sig: Take 1 tablet (5 mg total) by mouth daily.    Dispense:  90 tablet    Refill:  3    Patient Instructions  Medication Instructions:  START: Rosuvastatin 5 mg daily  *If you need a refill on your cardiac medications before your next appointment, please call your pharmacy*   Lab Work: Your provider has recommended lab work December, 2023 (fasting lipid). Please have this collected at [facility] [place]. The lab is open 8:00 am - 4:30 pm. Please avoid 12:00p - 1:00p for lunch hour. You do not need an appointment. Please go to [address] [place], [place] [address]. This is in the Primary Care office on the 3rd floor, let them know you are there for blood work and they will direct you to the lab.    Testing/Procedures: None ordered today   Follow-Up: At [facility], you and your health needs are our priority.  As part of our continuing mission to provide  you with exceptional heart care, we have created designated Provider Care Teams.  These Care Teams include your primary Cardiologist (physician) and Advanced Practice Providers (APPs -  Physician Assistants and Nurse Practitioners) who all work together to provide you with the care you need, when you need it.  We recommend signing up for the patient portal called "MyChart".  Sign up information is provided on this After Visit Summary.  MyChart is used to connect with patients for Virtual Visits (Telemedicine).  Patients are able to view lab/test results, encounter notes, upcoming appointments, etc.  Non-urgent messages can be sent to your provider as well.   To learn more about what you can do with MyChart, go to [email or web address].    Your next appointment:   1 year(s)  The format for your next appointment:   In Person  Provider:   Buford Dresser, MD     Recent data, summarized from [place] from a study from the [facility]:  https://wilkins.info/  "Researchers at the [facility] set out to answer this question by comparing statins to supplements in a clinical trial. They tracked the outcomes of 190 adults, ages 27 to 37. Some participants were given a 5 mg daily dose of rosuvastatin, a statin that is sold under the brand name Crestor for 28 days. Others were given supplements, including fish oil, cinnamon, garlic, turmeric, plant sterols or red yeast rice for the same period."  "What we found was that rosuvastatin lowered LDL cholesterol by almost 38% and that was vastly superior to placebo and any of the six supplements studied in the trial," study Despina Hidden, M.D. of the [facility] [place] told NPR. He says this level of reduction is enough to lower the risk of heart attacks and strokes. The findings are  published in the Journal of the [employer] of Cardiology.  "Oftentimes these supplements are marketed as 'natural ways' to lower your cholesterol," says Laffin. But he says none of the dietary supplements demonstrated any significant decrease in LDL cholesterol compared with a placebo. LDL cholesterol is considered the 'bad cholesterol' because it can contribute to plaque build-up in the artery walls - which can narrow the arteries, and set the stage for heart attacks and strokes"     I,Breanna Adamick,acting as a scribe for Buford Dresser, MD.,have documented all relevant documentation on the behalf of Buford Dresser, MD,as directed by  Buford Dresser, MD while in the presence of Buford Dresser, MD.   I, Buford Dresser, MD, have reviewed all documentation for this visit. The documentation on 07/06/22 for the exam, diagnosis, procedures, and orders are all accurate and complete.   Signed, Buford Dresser, MD PhD 06/17/2022     [place]

## 2022-06-18 LAB — LIPID PANEL
Chol/HDL Ratio: 2.9 ratio (ref 0.0–4.4)
Cholesterol, Total: 218 mg/dL — ABNORMAL HIGH (ref 100–199)
HDL: 76 mg/dL (ref >39)
LDL Chol Calc (NIH): 128 mg/dL — ABNORMAL HIGH (ref 0–99)
Triglycerides: 80 mg/dL (ref 0–149)
VLDL Cholesterol Cal: 14 mg/dL (ref 5–40)

## 2022-06-19 ENCOUNTER — Telehealth (INDEPENDENT_AMBULATORY_CARE_PROVIDER_SITE_OTHER): Payer: Medicare Other | Admitting: Internal Medicine

## 2022-06-19 ENCOUNTER — Telehealth: Payer: Self-pay | Admitting: Internal Medicine

## 2022-06-19 ENCOUNTER — Other Ambulatory Visit (HOSPITAL_BASED_OUTPATIENT_CLINIC_OR_DEPARTMENT_OTHER): Payer: Self-pay

## 2022-06-19 ENCOUNTER — Encounter: Payer: Self-pay | Admitting: Internal Medicine

## 2022-06-19 ENCOUNTER — Telehealth: Payer: Self-pay | Admitting: Cardiology

## 2022-06-19 VITALS — BP 138/87 | HR 60

## 2022-06-19 DIAGNOSIS — I2510 Atherosclerotic heart disease of native coronary artery without angina pectoris: Secondary | ICD-10-CM

## 2022-06-19 DIAGNOSIS — T50905A Adverse effect of unspecified drugs, medicaments and biological substances, initial encounter: Secondary | ICD-10-CM | POA: Diagnosis not present

## 2022-06-19 NOTE — Telephone Encounter (Signed)
Scheduled appointment

## 2022-06-19 NOTE — Telephone Encounter (Signed)
Took at 7:15 am and s/s started about 45 min This am blood pressure 104/75 HR 65, 138/78 HR 93 later Did an hour walk and helped briefly, has returned  Denies double vision  Does not feel like she is coming down with anything.  Stated she can not concentrate Feels like if she closes her eyes and lays down going to make worse   Discussed with Dr Harrell Gave, will d/c Rosuvastatin and contact PCP regarding above s/s   Patient would like to know what Dr Harrell Gave would like to do moving forward   Will forward to Dr Harrell Gave for review

## 2022-06-19 NOTE — Progress Notes (Signed)
   Subjective:    Patient ID: Lauren Calhoun, female    DOB: 1951-05-13, 71 y.o.   MRN: [id number]  HPI 71 year old female seen today via interactive audio and video telecommunications.  She is at her home and I am at my office.  She is identified using 2 identifiers as Lauren Calhoun, a patient in this practice.  She is agreeable to visit in this format today as she is not feeling well and does not want to drive to the office.  Patient recently started taking Crestor 5 mg daily for pure hypercholesterolemia.  2 days ago lipid panel showed total cholesterol of 218 and LDL cholesterol of 128.  She saw Dr. Al Pimple who recommended low-dose Crestor 5 mg daily.  In August patient had coronary calcium scoring and total score was 167.  Patient stays in good health, does yoga and stays trim.  This morning, she took rosuvastatin around 7:15am and around 45 minutes later she had some trouble concentrating.  She felt "spacey "and had a low-grade headache.  Patient reported blood pressure was 104/75, pulse 65 early this morning around 6:45 AM.  She took blood pressure and pulse again at 1:27 PM and blood pressure was 127/93 and pulse 69.  Does not feel able to drive just right now.  Patient says she beginning to feel a little better and less lightheaded.  Her general health is excellent and she does not take any routine medications on a chronic basis except for aspirin daily.  She presented to the emergency department September 6 with a 1 minute episode of left-handed numbness and double vision while driving on the freeway around 7:30 AM.  She saw neurologist on September 7 who recommended MRI of the brain.  This study was normal.  MRI of the orbits also normal.  CT angio of head and neck on September 6 was normal.  She is planning a trip to [place] in the near future.  This afternoon, she says she is beginning to feel better.  I looked online at Up-to-date for side effects of rosuvastatin.  It seems  headache and dizziness have been reported with rosuvastatin.  The percentages are low in the range of 4 to 9%.  Review of Systems see above     Objective:   Physical Exam She is seen virtually in no acute distress.  She has no facial weakness or dysarthria.  Her speech is normal.  Her thought process appears to be normal as well.       Assessment & Plan:  Adverse reaction to Crestor-doubt neurological event  Plan: She will stop Crestor.  She will stay well-hydrated.  She will monitor symptoms and let me know if she has any questions or concerns at all or if symptoms worsen.  Have suggested she have consult with lipid clinic for management of lipids.  She is agreeable to this when she returns from [place].  Time spent reviewing chart, interviewing patient virtually and medical decision making is 20 minutes

## 2022-06-19 NOTE — Telephone Encounter (Signed)
   Pt c/o medication issue:  1. Name of Medication: rosuvastatin (CRESTOR) 5 MG tablet  2. How are you currently taking this medication (dosage and times per day)? Take 1 tablet (5 mg total) by mouth daily.  3. Are you having a reaction (difficulty breathing--STAT)? No   4. What is your medication issue? Pt said, she took this medication this morning. She said, after taking it she felt spaciness and cant concentrate. Also, she had a headache and feel like she cant drive. She said, she can walk and eat with no issue. She wanted to know if this is a normal reaction from crestor and if she should just continue taking this medication

## 2022-06-19 NOTE — Telephone Encounter (Signed)
Lauren Calhoun [phone]  Ashawna called to say she started taking rosuvastatin this morning at 7:15 and about 45 minutes later she had trouble concentrating, feeling spacy and low grade headache. Blood pressure was 104/75, pulse 65 at 6:45 am and she took it again at 1:27 and it was 127/93 and pulse 69. She says she is alright to walk and eat, but does not feel she is alright to drive just can not concentrate enough.   She called Dr Harrell Gave office and they said this was not a side effect of this medication and wanted her to call her PCP.

## 2022-06-19 NOTE — Patient Instructions (Signed)
Patient will discontinue low dose statin(rosuvastatin). She is beginning to feel better now. Leaving for trip to [place] for 2 weeks. We can refer her to lipid clinic if she desires when she returns.

## 2022-06-20 NOTE — ED Provider Notes (Signed)
[facility] EMERGENCY DEPT Provider Note   CSN: [id number] Arrival date & time: 06/10/22  1107     History  Chief Complaint  Patient presents with   Diplopia   Numbness    Lauren Calhoun is a 71 y.o. female.  HPI    71 year old female comes in with chief complaint of double vision and numbness.  Patient has history of CKD, ovarian cancer.  She comes to the ER because she had an episode of blurry vision and numbness earlier today.  Patient had 1 minute episode of left hand numbness and binocular diplopia.  Symptoms started while she was driving.  She is back to baseline normal.  She has had 2 other episodes in the last 2 weeks of double vision.  No history of strokes.  Home Medications Prior to Admission medications   Medication Sig Start Date End Date Taking? Authorizing Provider  aspirin EC 81 MG tablet Take 1 tablet (81 mg total) by mouth daily. Swallow whole. 06/10/22  Yes Kadin Canipe, MD  rosuvastatin (CRESTOR) 5 MG tablet Take 1 tablet (5 mg total) by mouth daily. 06/17/22 06/12/23  Buford Dresser, MD      Allergies    Patient has no known allergies.    Review of Systems   Review of Systems  All other systems reviewed and are negative.   Physical Exam Updated Vital Signs BP (!) 141/101   Pulse (!) 51   Temp 97.6 F (36.4 C) (Axillary) Comment: Unable to do oral temp, due to pt drinking iced water  Resp 19   Ht '5\' 5"'$  (1.651 m)   Wt 63.5 kg   SpO2 99%   BMI 23.30 kg/m  Physical Exam Vitals and nursing note reviewed.  Constitutional:      Appearance: She is well-developed.  HENT:     Head: Atraumatic.  Eyes:     Extraocular Movements: Extraocular movements intact.     Pupils: Pupils are equal, round, and reactive to light.  Cardiovascular:     Rate and Rhythm: Normal rate.  Pulmonary:     Effort: Pulmonary effort is normal.  Musculoskeletal:     Cervical back: Normal range of motion and neck supple.  Skin:    General: Skin is  warm and dry.  Neurological:     Mental Status: She is alert and oriented to person, place, and time.     Cranial Nerves: No cranial nerve deficit.     Sensory: No sensory deficit.     Motor: No weakness.     Coordination: Coordination normal.     Comments: No nystagmus, no ophthalmoplegia     ED Results / Procedures / Treatments   Labs (all labs ordered are listed, but only abnormal results are displayed) Labs Reviewed  CBC WITH DIFFERENTIAL/PLATELET - Abnormal; Notable for the following components:      Result Value   WBC 3.6 (*)    All other components within normal limits  COMPREHENSIVE METABOLIC PANEL - Abnormal; Notable for the following components:   Glucose, Bld 101 (*)    All other components within normal limits    EKG EKG Interpretation  Date/Time:  Wednesday June 10 2022 11:17:39 EDT Ventricular Rate:  53 PR Interval:  164 QRS Duration: 70 QT Interval:  430 QTC Calculation: 403 R Axis:   71 Text Interpretation: Sinus bradycardia Low voltage QRS Borderline ECG When compared with ECG of 11-Jun-2004 10:52, Criteria for Septal infarct are no longer Present T wave inversion no longer  evident in Anterior leads No acute changes Confirmed by Varney Biles [phone]) on 06/10/2022 2:56:50 PM  Radiology No results found.  Procedures Procedures    Medications Ordered in ED Medications  iohexol (OMNIPAQUE) 350 MG/ML injection 80 mL (75 mLs Intravenous Contrast Given 06/10/22 1534)    ED Course/ Medical Decision Making/ A&P                           Medical Decision Making Amount and/or Complexity of Data Reviewed Labs: ordered. Radiology: ordered.  Risk OTC drugs. Prescription drug management.   71 year old female comes in with chief complaint of double vision and blurry vision that lasted for few minutes.  She also had left-sided numbness.  Symptoms less than 10 minutes total.  Has had 2 episodes of vision change prior.  Patient has history of  CKD.  Differential diagnosis includes TIA. Although I considered retinal detachment, vitreous hemorrhage, ocular migraine in the differential, they seem to be less likely.  Plan is to get CT angiogram of head and neck.  ABCD2 score is 2. I discussed the case with neurology service.  They recommend that if the CT angio is negative, patient can be safely discharged with outpatient follow-up with strict ER return precautions.  Patient's care has been signed out to incoming team.  I have discussed the plan with the patient as well along with return precautions if she is dc.  Final Clinical Impression(s) / ED Diagnoses Final diagnoses:  Diplopia    Rx / DC Orders ED Discharge Orders          Ordered    aspirin EC 81 MG tablet  Daily,   Status:  Discontinued        06/10/22 1603    aspirin EC 81 MG tablet  Daily        06/10/22 1603              Varney Biles, MD 06/20/22 1953

## 2022-06-22 NOTE — Telephone Encounter (Signed)
Left message to call back  

## 2022-06-22 NOTE — Telephone Encounter (Signed)
For now, I would stay off of the rosuvastatin. Once she makes sure there is no other potential cause with her PCP, we can discuss other options. I would give her several weeks off before we talk about other options. Happy to discuss in person or a virtual visit in a few weeks, whatever she prefers.

## 2022-06-24 ENCOUNTER — Telehealth: Payer: Self-pay | Admitting: Neurology

## 2022-06-24 NOTE — Telephone Encounter (Signed)
Spoke to pt lab work is not back yet

## 2022-06-24 NOTE — Telephone Encounter (Signed)
Pt is asking for a call with results to lab work from last week

## 2022-06-25 NOTE — Telephone Encounter (Signed)
Pt updated and stated PCP is referring her to a lipid specialist but will call to provide Dr. Harrell Gave with an update.

## 2022-06-29 ENCOUNTER — Other Ambulatory Visit (HOSPITAL_BASED_OUTPATIENT_CLINIC_OR_DEPARTMENT_OTHER): Payer: Self-pay

## 2022-07-06 ENCOUNTER — Telehealth: Payer: Self-pay | Admitting: Cardiology

## 2022-07-06 ENCOUNTER — Encounter (HOSPITAL_BASED_OUTPATIENT_CLINIC_OR_DEPARTMENT_OTHER): Payer: Self-pay | Admitting: Cardiology

## 2022-07-06 ENCOUNTER — Telehealth: Payer: Self-pay | Admitting: Neurology

## 2022-07-06 NOTE — Telephone Encounter (Signed)
We have not received results.

## 2022-07-06 NOTE — Telephone Encounter (Signed)
Patient states in her chart is listed she has a history of TIA's which she does not.  He mother had a history them.  So it should say family history of TIA's.

## 2022-07-06 NOTE — Telephone Encounter (Signed)
Pt is calling. Stated she wanted to follow-up on lab results. Pt is requesting a call-back  from the nurse.

## 2022-07-07 NOTE — Telephone Encounter (Signed)
I have reached out via my chart on this.

## 2022-07-13 ENCOUNTER — Telehealth: Payer: Self-pay | Admitting: Internal Medicine

## 2022-07-13 NOTE — Telephone Encounter (Signed)
Lauren Calhoun [phone]  Micayla called to say she is back from her trip and she would like to wait until her lab appointment in December to see what the results are before pursuing the [facility].

## 2022-07-13 NOTE — Telephone Encounter (Signed)
Called patient to let her know waiting till December is fine. She verbalized understanding

## 2022-07-17 ENCOUNTER — Other Ambulatory Visit: Payer: Self-pay | Admitting: Neurology

## 2022-07-17 DIAGNOSIS — R768 Other specified abnormal immunological findings in serum: Secondary | ICD-10-CM | POA: Diagnosis not present

## 2022-07-20 DIAGNOSIS — F4312 Post-traumatic stress disorder, chronic: Secondary | ICD-10-CM | POA: Diagnosis not present

## 2022-07-20 LAB — ANA+ENA+DNA/DS+SCL 70+SJOSSA/B
ANA Titer 1: NEGATIVE
ENA RNP Ab: 2.8 AI — ABNORMAL HIGH (ref 0.0–0.9)
ENA SM Ab Ser-aCnc: <0.2 AI (ref 0.0–0.9)
ENA SSA (RO) Ab: <0.2 AI (ref 0.0–0.9)
ENA SSB (LA) Ab: <0.2 AI (ref 0.0–0.9)
Scleroderma (Scl-70) (ENA) Antibody, IgG: <0.2 AI (ref 0.0–0.9)
dsDNA Ab: 1 IU/mL (ref 0–9)

## 2022-07-20 NOTE — Progress Notes (Signed)
I think this is actually Dr. Clydene Fake patient, looks like she was last seen by him in September

## 2022-07-24 ENCOUNTER — Telehealth: Payer: Self-pay | Admitting: Internal Medicine

## 2022-07-24 NOTE — Telephone Encounter (Signed)
ANA+ENA+DNA/DS+Scl 70+SjoSSA/B   Lauren Calhoun called wanting to go over labs that was done in Neurology office, she said that she has tried to get in touch with them and no one has called her back. She has also sent them My Chart message. She is Dr Leonie Man patient.

## 2022-07-27 NOTE — Telephone Encounter (Signed)
Patient called and said she wanted to thank Dr Renold Genta for calling her and also wanted to let her know that Dr Leonie Man said no worry on the labs

## 2022-07-27 NOTE — Telephone Encounter (Signed)
Response of labs sent via my chart.

## 2022-08-06 ENCOUNTER — Telehealth: Payer: Self-pay | Admitting: Gastroenterology

## 2022-08-06 NOTE — Telephone Encounter (Signed)
Good Afternoon Dr.Armbruster,  We received a referral on this patient to have a screening colonoscopy. She had one done in 2018 by Dr. Earlean Shawl and it was recommended she have a repeat in 5 years. He is now retired which is why she is requesting this transfer, she would like to be seen with you.  We have records for review. Please advise on scheduling. Thank you.

## 2022-08-06 NOTE — Telephone Encounter (Deleted)
Good Morning Dr.Cunningham,  We received a referral on this patient to have a screening colonoscopy. She had one done by Dr. Earlean Shawl in 2018 and it was recommend she have a repeat in 5 years. He is now retired which is why she is requesting the transfer, she would like to be seen with you.   We have records for review. Please advise on scheduling, thank you.

## 2022-08-12 NOTE — Telephone Encounter (Signed)
Ok to schedule direct per Dr.Armbruster.

## 2022-08-17 DIAGNOSIS — F4312 Post-traumatic stress disorder, chronic: Secondary | ICD-10-CM | POA: Diagnosis not present

## 2022-08-20 NOTE — Telephone Encounter (Signed)
Patient will call back to schedule after husband has hand surgery.

## 2022-08-31 ENCOUNTER — Encounter: Payer: Self-pay | Admitting: Gastroenterology

## 2022-09-01 ENCOUNTER — Ambulatory Visit (INDEPENDENT_AMBULATORY_CARE_PROVIDER_SITE_OTHER): Payer: Medicare Other | Admitting: Sports Medicine

## 2022-09-01 ENCOUNTER — Telehealth: Payer: Self-pay | Admitting: Internal Medicine

## 2022-09-01 VITALS — BP 122/70 | Ht 66.0 in | Wt 132.0 lb

## 2022-09-01 DIAGNOSIS — S76312A Strain of muscle, fascia and tendon of the posterior muscle group at thigh level, left thigh, initial encounter: Secondary | ICD-10-CM | POA: Diagnosis not present

## 2022-09-01 DIAGNOSIS — I2510 Atherosclerotic heart disease of native coronary artery without angina pectoris: Secondary | ICD-10-CM | POA: Diagnosis not present

## 2022-09-01 NOTE — Telephone Encounter (Signed)
Lauren Calhoun [phone]  [name] called to say she has been coughing for 1 1/2 weeks and she is now hearing wheezing and coughing up lemon to dark yellow mucus, should she come in to be seen or wait it out a little longer?

## 2022-09-01 NOTE — Progress Notes (Signed)
   Subjective:    Patient ID: Lauren Calhoun, female    DOB: [birth year], 71 y.o.   MRN: [id number]  HPI chief complaint: Left hamstring pain  Lauren Calhoun presents today with left hamstring pain has been present for about a week or week and a half.  No known trauma.  She describes a tightness in the mid body of the left hamstring with a sensation of feeling like the hamstring tendon is about the tear.  It does radiate up to the origin of the hamstring at the ischial tuberosity but does not radiate past the knee.  She is very active and has a history of a medial meniscal tear for which she does daily home exercises for.  She has noticed that she has been unable to do some of those exercises due to her hamstring pain.  She denies any recent increase in activity other than trying to learn how to do a handstand away from the wall.  She has not noticed any swelling or bruising.  She does endorse pain with exercise as well as with sitting.    Review of Systems As above    Objective:   Physical Exam  Well-developed, fit appearing.  No acute distress  Left hamstring: There is tenderness to palpation in the mid belly of the hamstring muscles.  No palpable defect.  Reproducible pain with knee flexion but good strength.  Negative logroll.  She has definite tightness with hamstring stretch.  Neurovascularly intact distally.      Assessment & Plan:   Left hamstring strain  [name] will start home exercises in the form of [place] exercises.  She will also exercise with a body helix thigh compression sleeve.  She will limit all other exercise based on her pain and will follow-up with me again in 3 weeks for reevaluation.  If symptoms are not improving then we will consider formal physical therapy at that time (she has worked with Barbaraann Barthel in the past).  She will call with questions or concerns in the interim.  This note was dictated using Dragon naturally speaking software and may contain errors in syntax,  spelling, or content which have not been identified prior to signing this note.

## 2022-09-02 ENCOUNTER — Encounter: Payer: Self-pay | Admitting: Internal Medicine

## 2022-09-02 ENCOUNTER — Ambulatory Visit (INDEPENDENT_AMBULATORY_CARE_PROVIDER_SITE_OTHER): Payer: Medicare Other | Admitting: Internal Medicine

## 2022-09-02 VITALS — BP 104/78 | HR 61 | Temp 98.8°F

## 2022-09-02 DIAGNOSIS — R0989 Other specified symptoms and signs involving the circulatory and respiratory systems: Secondary | ICD-10-CM | POA: Diagnosis not present

## 2022-09-02 DIAGNOSIS — I2510 Atherosclerotic heart disease of native coronary artery without angina pectoris: Secondary | ICD-10-CM | POA: Diagnosis not present

## 2022-09-02 DIAGNOSIS — J22 Unspecified acute lower respiratory infection: Secondary | ICD-10-CM

## 2022-09-02 DIAGNOSIS — J99 Respiratory disorders in diseases classified elsewhere: Secondary | ICD-10-CM | POA: Diagnosis not present

## 2022-09-02 LAB — POC COVID19 BINAXNOW: SARS Coronavirus 2 Ag: NEGATIVE

## 2022-09-02 MED ORDER — METHYLPREDNISOLONE ACETATE 80 MG/ML IJ SUSP
80.0000 mg | Freq: Once | INTRAMUSCULAR | Status: AC
  Administered 2022-09-02: 80 mg via INTRAMUSCULAR

## 2022-09-02 MED ORDER — AZITHROMYCIN 250 MG PO TABS: ORAL_TABLET | ORAL | 0 refills | Status: AC

## 2022-09-02 MED ORDER — BENZONATATE 100 MG PO CAPS: 100.0000 mg | ORAL_CAPSULE | Freq: Two times a day (BID) | ORAL | 0 refills | Status: DC | PRN

## 2022-09-02 NOTE — Progress Notes (Signed)
   Subjective:    Patient ID: Lauren Calhoun, female    DOB: May 17, 1951, 71 y.o.   MRN: [id number]  HPI 2 weeks ago,  she mulched leaves and wore googles and a bandana. There was lots of dust.  Subsequently developed dry cough. Then, cough became productive of dark sputum. No fever or chills. Energy level is Ok. She has slight sore throat Seemed to get better. Yesterday, she  had wheezing and SOB. This was concerning to her.    Review of Systems no fever or shortness of breath     Objective:   Physical Exam VS reviewed.  Blood pressure 104/78, pulse 61, temperature 98.8 degrees pulse oximetry 97% Skin warm and dry.  No cervical adenopathy.  TMs clear.  Neck supple.  Chest clear.      Assessment & Plan:   Acute lower respiratory infection  Plan: Zithromax Z-PAK 2 tabs day 1 followed by 1 tab days 2 through 5.  Given prescription for Tessalon Perles 100 mg to take up to 3 times daily as needed for cough.  Rapid COVID test is negative.  Respiratory virus panel obtained and is pending.  Was given Depo-Medrol 80 mg IM in the office today.  I think this is more of an allergic respiratory illness rather than a viral respiratory infection.  Addendum: Respiratory virus panel is negative

## 2022-09-03 ENCOUNTER — Ambulatory Visit: Payer: Medicare Other | Admitting: Sports Medicine

## 2022-09-04 LAB — RESPIRATORY VIRUS PANEL

## 2022-09-22 ENCOUNTER — Ambulatory Visit (INDEPENDENT_AMBULATORY_CARE_PROVIDER_SITE_OTHER): Payer: Medicare Other | Admitting: Sports Medicine

## 2022-09-22 VITALS — BP 112/78 | Ht 65.5 in | Wt 131.0 lb

## 2022-09-22 DIAGNOSIS — S76312D Strain of muscle, fascia and tendon of the posterior muscle group at thigh level, left thigh, subsequent encounter: Secondary | ICD-10-CM

## 2022-09-22 DIAGNOSIS — I2510 Atherosclerotic heart disease of native coronary artery without angina pectoris: Secondary | ICD-10-CM | POA: Diagnosis not present

## 2022-09-22 NOTE — Progress Notes (Signed)
   Subjective:    Patient ID: Lauren Calhoun, female    DOB: [birth year], 71 y.o.   MRN: [id number]  HPI  Lauren Calhoun presents today for follow-up on left hamstring pain.  She admits that she has not been quite as diligent about doing her home exercises as she should.  Despite that, she does feel like she has made some improvement.  She is doing well with her [place] exercises in general but did find that Diver to be somewhat of a struggle early on.  We had previously discussed a referral to Lauren Calhoun and she would like to go ahead and schedule that.  Review of Systems As above    Objective:   Physical Exam  Well-developed, fit appearing.  No acute distress  Left hamstring: There is still some pain with resisted knee flexion at 90 degrees.  This pain is localized to the midportion of the left hamstring.  No palpable defect.  Neurovascular intact distally.       Assessment & Plan:   Left hamstring strain  Although Lauren Calhoun is slowly improving she may want to work with Lauren Calhoun not only on her rehabilitation but also on education regarding a preventative program.  I provided her with a prescription and she may wait until the new year to schedule this.  She will continue with her body helix compression sleeve when active and also continue with her home exercises until she sees Lauren Calhoun.  She will follow-up for ongoing or recalcitrant issues.  This note was dictated using Dragon naturally speaking software and may contain errors in syntax, spelling, or content which have not been identified prior to signing this note.

## 2022-09-23 ENCOUNTER — Other Ambulatory Visit: Payer: Self-pay

## 2022-09-23 ENCOUNTER — Ambulatory Visit (AMBULATORY_SURGERY_CENTER): Payer: Self-pay | Admitting: *Deleted

## 2022-09-23 ENCOUNTER — Other Ambulatory Visit: Payer: Self-pay | Admitting: *Deleted

## 2022-09-23 VITALS — Ht 66.0 in | Wt 131.0 lb

## 2022-09-23 DIAGNOSIS — Z1231 Encounter for screening mammogram for malignant neoplasm of breast: Secondary | ICD-10-CM | POA: Diagnosis not present

## 2022-09-23 DIAGNOSIS — Z1211 Encounter for screening for malignant neoplasm of colon: Secondary | ICD-10-CM

## 2022-09-23 LAB — HM MAMMOGRAPHY

## 2022-09-23 MED ORDER — NA SULFATE-K SULFATE-MG SULF 17.5-3.13-1.6 GM/177ML PO SOLN: 1.0000 | Freq: Once | ORAL | 0 refills | Status: DC

## 2022-09-23 MED ORDER — NA SULFATE-K SULFATE-MG SULF 17.5-3.13-1.6 GM/177ML PO SOLN: 1.0000 | Freq: Once | ORAL | 0 refills | Status: AC

## 2022-09-23 NOTE — Progress Notes (Signed)
  Previsit over phone Instructions sent via my chart and mailed to verified address No egg or soy allergy known to patient  No issues known to pt with past sedation with any surgeries or procedures Patient denies ever being told they had issues or difficulty with intubation  No FH of Malignant Hyperthermia Pt is not on diet pills Pt is not on  home 02  Pt is not on blood thinners  Pt denies issues with constipation  Pt encouraged to use to use Singlecare or Goodrx to reduce cost  Patient's chart reviewed by Osvaldo Angst CNRA prior to previsit and patient appropriate for the [place].  Previsit completed and red dot placed by patient's name on their procedure day (on provider's schedule).  Lauren Calhoun

## 2022-09-25 ENCOUNTER — Other Ambulatory Visit (HOSPITAL_BASED_OUTPATIENT_CLINIC_OR_DEPARTMENT_OTHER): Payer: Self-pay | Admitting: Cardiology

## 2022-09-25 ENCOUNTER — Encounter: Payer: Self-pay | Admitting: Internal Medicine

## 2022-09-25 DIAGNOSIS — Z79899 Other long term (current) drug therapy: Secondary | ICD-10-CM | POA: Diagnosis not present

## 2022-09-26 LAB — LIPID PANEL
Chol/HDL Ratio: 2.5 ratio (ref 0.0–4.4)
Cholesterol, Total: 204 mg/dL — ABNORMAL HIGH (ref 100–199)
HDL: 82 mg/dL (ref >39)
LDL Chol Calc (NIH): 110 mg/dL — ABNORMAL HIGH (ref 0–99)
Triglycerides: 65 mg/dL (ref 0–149)
VLDL Cholesterol Cal: 12 mg/dL (ref 5–40)

## 2022-09-29 ENCOUNTER — Encounter: Payer: Self-pay | Admitting: Internal Medicine

## 2022-09-29 NOTE — Patient Instructions (Addendum)
Patient will take Zithromax Z-PAK as directed.  Was given prescription for Tessalon Perles.  Depo-Medrol 80 mg IM.  Rapid COVID test is negative.  Respiratory virus panel is negative.

## 2022-10-06 ENCOUNTER — Telehealth (HOSPITAL_BASED_OUTPATIENT_CLINIC_OR_DEPARTMENT_OTHER): Payer: Self-pay

## 2022-10-06 NOTE — Telephone Encounter (Addendum)
Seen by patient [name] on 10/03/2022  3:06 PM; follow up mychart message sent to patient with the below options.    ----- Message from Loel Dubonnet, NP sent at 10/03/2022  3:04 PM EST ----- LDL (bad cholesterol) not of goal at <70. Did not tolerate Rosuvastatin. Recommend either referral to lipid clinic pharmacist or scheduling a follow up visit with Dr. Harrell Gave. Whichever she prefers.

## 2022-10-07 ENCOUNTER — Encounter: Payer: Self-pay | Admitting: Sports Medicine

## 2022-10-07 ENCOUNTER — Telehealth: Payer: Self-pay | Admitting: Internal Medicine

## 2022-10-07 NOTE — Telephone Encounter (Signed)
Lauren Calhoun [phone]  [name] called to see if you had gone over her lab results from 09/25/2022 that Dr Harrell Gave had run. She wanted to know if you are okay with her to continue what she is doing, with diet and exercise or should she come in for office visit to discuss going to the [facility].

## 2022-10-10 DIAGNOSIS — R269 Unspecified abnormalities of gait and mobility: Secondary | ICD-10-CM | POA: Diagnosis not present

## 2022-10-10 DIAGNOSIS — M79605 Pain in left leg: Secondary | ICD-10-CM | POA: Diagnosis not present

## 2022-10-13 ENCOUNTER — Encounter: Payer: Self-pay | Admitting: Internal Medicine

## 2022-10-13 ENCOUNTER — Ambulatory Visit (INDEPENDENT_AMBULATORY_CARE_PROVIDER_SITE_OTHER): Payer: Medicare Other | Admitting: Internal Medicine

## 2022-10-13 ENCOUNTER — Telehealth: Payer: Self-pay | Admitting: Internal Medicine

## 2022-10-13 VITALS — BP 104/68 | HR 62 | Temp 98.8°F

## 2022-10-13 DIAGNOSIS — I2510 Atherosclerotic heart disease of native coronary artery without angina pectoris: Secondary | ICD-10-CM

## 2022-10-13 DIAGNOSIS — H6691 Otitis media, unspecified, right ear: Secondary | ICD-10-CM | POA: Diagnosis not present

## 2022-10-13 DIAGNOSIS — H65112 Acute and subacute allergic otitis media (mucoid) (sanguinous) (serous), left ear: Secondary | ICD-10-CM

## 2022-10-13 DIAGNOSIS — J029 Acute pharyngitis, unspecified: Secondary | ICD-10-CM | POA: Diagnosis not present

## 2022-10-13 LAB — POCT RAPID STREP A (OFFICE): Rapid Strep A Screen: NEGATIVE

## 2022-10-13 MED ORDER — AZITHROMYCIN 250 MG PO TABS: ORAL_TABLET | ORAL | 0 refills | Status: AC

## 2022-10-13 NOTE — Telephone Encounter (Signed)
Patient called and said drainage flem, cough and right ear ache, this has been going on since 10/08/22. She wanted to know if she can be seen either this afternoon. No test done

## 2022-10-13 NOTE — Patient Instructions (Signed)
Zithromax Z pak : 2 tabs day 1 followed by one tab days 2-5. Rest and drink fluids. May take decongestant if needed.

## 2022-10-13 NOTE — Progress Notes (Signed)
   Subjective:    Patient ID: Lauren Calhoun, female    DOB: 1951-09-02, 72 y.o.   MRN: [id number]  HPI 72 year old Female seen for right ear discomfort. Onset last Thursday with ear pain, laryngitis and right ear discomfort. Has been swimming in a pool as well.  She has some concerns about her lipids.  Significant improvement in lipid panel from September 2023.  This is with diet and exercise alone.  Review of Systems Coronary calcium score reviewed.  Total coronary calcium score was 167.  Lipid panel has improved.  She really does not want to take statin medication.  Continue to monitor with diet and exercise only.     Objective:   Physical Exam Temperature 98.8 degrees pulse oximetry 97%, pulse 62, blood pressure 104/68 Skin: Warm and dry.  Right TM is red and slightly full.  Left TM is slightly full and pink.  Neck is supple.  Pharynx is slightly injected.  No exudate noted.  Rapid strep screen is negative.      Assessment & Plan:   Acute right otitis media  Acute serous left otitis media  Pharyngitis-tested negative for strep  Plan: We discussed various treatment options with shared decision making decided on Zithromax Z-PAK 2 tabs day 1 followed by 1 tab days 2 through 5.  Rest and stay well-hydrated.  Call if not better in 5 days or sooner if worse.  Avoid swimming for about a week.  May take decongestant if needed for ear discomfort/congestion

## 2022-10-13 NOTE — Telephone Encounter (Signed)
Scheduled and lvm for patient to be aware or call us back if not able to make

## 2022-10-14 ENCOUNTER — Telehealth: Payer: Self-pay | Admitting: Neurology

## 2022-10-14 NOTE — Telephone Encounter (Signed)
Pt said having no more symptoms from double vision or related symptoms. Working with GP and cardiologist. If you need to see me please call back. Thank you so much.

## 2022-10-15 ENCOUNTER — Ambulatory Visit: Payer: Medicare Other | Admitting: Neurology

## 2022-10-16 NOTE — Telephone Encounter (Signed)
Patient was in for something else and this was taking care of at that time

## 2022-10-22 DIAGNOSIS — M79605 Pain in left leg: Secondary | ICD-10-CM | POA: Diagnosis not present

## 2022-10-22 DIAGNOSIS — R269 Unspecified abnormalities of gait and mobility: Secondary | ICD-10-CM | POA: Diagnosis not present

## 2022-10-23 ENCOUNTER — Ambulatory Visit (INDEPENDENT_AMBULATORY_CARE_PROVIDER_SITE_OTHER): Payer: Medicare Other | Admitting: Cardiology

## 2022-10-23 ENCOUNTER — Encounter (HOSPITAL_BASED_OUTPATIENT_CLINIC_OR_DEPARTMENT_OTHER): Payer: Self-pay | Admitting: Cardiology

## 2022-10-23 VITALS — BP 120/82 | HR 66 | Ht 66.0 in | Wt 136.8 lb

## 2022-10-23 DIAGNOSIS — I2510 Atherosclerotic heart disease of native coronary artery without angina pectoris: Secondary | ICD-10-CM

## 2022-10-23 DIAGNOSIS — Z7189 Other specified counseling: Secondary | ICD-10-CM | POA: Diagnosis not present

## 2022-10-23 NOTE — Patient Instructions (Signed)
Medication Instructions:  Your Physician recommend you continue on your current medication as directed.    *If you need a refill on your cardiac medications before your next appointment, please call your pharmacy*  Follow-Up: At [facility], you and your health needs are our priority.  As part of our continuing mission to provide you with exceptional heart care, we have created designated Provider Care Teams.  These Care Teams include your primary Cardiologist (physician) and Advanced Practice Providers (APPs -  Physician Assistants and Nurse Practitioners) who all work together to provide you with the care you need, when you need it.  We recommend signing up for the patient portal called "MyChart".  Sign up information is provided on this After Visit Summary.  MyChart is used to connect with patients for Virtual Visits (Telemedicine).  Patients are able to view lab/test results, encounter notes, upcoming appointments, etc.  Non-urgent messages can be sent to your provider as well.   To learn more about what you can do with MyChart, go to [email or web address].    Your next appointment:   Call us if you need [place]

## 2022-10-23 NOTE — Progress Notes (Signed)
Cardiology Office Note:    Date:  10/23/2022   ID:  Lauren Calhoun, DOB December 13, 1950, MRN [id number]  PCP:  Elby Showers, MD  Cardiologist:  Buford Dresser, MD  Referring MD: Elby Showers, MD   CC: follow up  History of Present Illness:    Lauren Calhoun is a 72 y.o. female who is seen for follow up. I initially met her as a new consult at the request of Baxley, Cresenciano Lick, MD for the evaluation and management of abnormal cardiac CT screening.  History: 06/10/2022 presented to ER for blurred vision and numbness. She reported having a 1 minute episode of left-handed numbness and double vision that day. She was found to have a low ABCD 2 score. CTA imaging revealed no evidence of emergent large vessel occlusion, proximal hemodynamically significant stenosis, or dissection. She was discharged with recommended neurology follow-up.   FH: paternal aunt who had a stroke in her 59.  Today: Doing very well. Changed to a Mediterranean based diet. Avoiding meat and processed food. Lost 10 lbs without trying. Remains very active.   Reviewed recent lipid panel and changes. Reviewed guidelines and options. After shared decision making, she will continue to pursue lifestyle management of her CV risk.  Denies chest pain, shortness of breath at rest or with normal exertion. No PND, orthopnea, LE edema or unexpected weight gain. No syncope or palpitations.   Past Medical History:  Diagnosis Date   Cancer ([place])    Breast,sential node   Ovarian cancer ([place])    Tear of meniscus of knee joint    presently has a tear left knee    Past Surgical History:  Procedure Laterality Date   ABDOMINAL HYSTERECTOMY  08/23/2012   Procedure: HYSTERECTOMY ABDOMINAL;  Surgeon: Alvino Chapel, MD;  Location: [facility];  Service: Gynecology;  Laterality: N/A;  Perineal biopsies   APPENDECTOMY  AGE 41   BREAST LUMPECTOMY  2005   RIGHT   LYMPHADENECTOMY  08/23/2012   Procedure: LYMPHADENECTOMY;   Surgeon: Alvino Chapel, MD;  Location: [facility];  Service: Gynecology;;  Pelvic and para-aortic   OMENTECTOMY  08/23/2012   Procedure: OMENTECTOMY;  Surgeon: Alvino Chapel, MD;  Location: [facility];  Service: Gynecology;;   SALPINGOOPHORECTOMY  08/23/2012   Procedure: SALPINGO OOPHORECTOMY;  Surgeon: Alvino Chapel, MD;  Location: [facility];  Service: Gynecology;  Laterality: Bilateral;    Current Medications: No current outpatient medications on file prior to visit.   No current facility-administered medications on file prior to visit.     Allergies:   Patient has no known allergies.   Social History   Tobacco Use   Smoking status: Never   Smokeless tobacco: Never  Vaping Use   Vaping Use: Never used  Substance Use Topics   Alcohol use: Yes    Comment: RARE MAYBE 2X MONTH   Drug use: No    Family History: family history includes Cancer in her maternal aunt, mother, and sister. There is no history of Colon cancer, Colon polyps, Diabetes, Rectal cancer, or Stomach cancer.  ROS:   Please see the history of present illness.  Additional pertinent ROS otherwise unremarkable.  EKGs/Labs/Other Studies Reviewed:    The following studies were reviewed today: Calcium score 05/15/22 FINDINGS: Coronary arteries: Normal origins.   Coronary Calcium Score:  Left main: 0  Left anterior descending artery: 143  Left circumflex artery: 1  Right coronary artery: 23  Total: 167  Percentile: 79   Pericardium:  Normal.  Ascending Aorta: Normal caliber.  Non-cardiac: See separate report from [facility].   IMPRESSION: Coronary calcium score of 167. This was 56 percentile for age-, race-, and sex-matched controls.    EKG:  EKG is personally reviewed.   10/23/22: not ordered today 06/10/22: sinus bradycardia at 53 bpm  Recent Labs: 03/16/2022: TSH 2.12 06/10/2022: ALT 12; BUN 15; Creatinine, Ser 0.97; Hemoglobin 13.8; Platelets 201; Potassium 3.8; Sodium  141  Recent Lipid Panel    Component Value Date/Time   CHOL 204 (H) 09/25/2022 0818   TRIG 65 09/25/2022 0818   HDL 82 09/25/2022 0818   CHOLHDL 2.5 09/25/2022 0818   CHOLHDL 2.9 03/16/2022 1124   VLDL 15 12/28/2016 1136   LDLCALC 110 (H) 09/25/2022 0818   LDLCALC 116 (H) 03/16/2022 1124    Physical Exam:    VS:  BP 120/82 (BP Location: Left Arm, Patient Position: Sitting, Cuff Size: Normal)   Pulse 66   Ht '5\' 6"'$  (1.676 m)   Wt 136 lb 12.8 oz (62.1 kg)   SpO2 99%   BMI 22.08 kg/m     Wt Readings from Last 3 Encounters:  10/23/22 136 lb 12.8 oz (62.1 kg)  09/23/22 131 lb (59.4 kg)  09/22/22 131 lb (59.4 kg)    GEN: Well nourished, well developed in no acute distress HEENT: Normal, moist mucous membranes NECK: No JVD CARDIAC: regular rhythm, normal S1 and S2, no rubs or gallops. No murmur. VASCULAR: Radial and DP pulses 2+ bilaterally. No carotid bruits RESPIRATORY:  Clear to auscultation without rales, wheezing or rhonchi  ABDOMEN: Soft, non-tender, non-distended MUSCULOSKELETAL:  Ambulates independently SKIN: Warm and dry, no edema NEUROLOGIC:  Alert and oriented x 3. No focal neuro deficits noted. PSYCHIATRIC:  Normal affect    ASSESSMENT:    1. Coronary artery calcification seen on CT scan   2. Nonocclusive coronary atherosclerosis of native coronary artery   3. Cardiac risk counseling   4. Counseling on health promotion and disease prevention     PLAN:    Coronary calcification on CT, consistent with nonobstructive CAD -we have discussed her results, guideline recommendations for medical management -she trialed rosuvastatin but was concerned about side effects.  -she has worked hard on lifestyle change.  -with coronary plaque, recommendation is for aspirin, statin, and LDL <70. She understands this but prefers to manage with lifestyle. She will contact me with new questions or concerns -reviewed red flag warning signs that need immediate medical  attention  Transient neurologic symptoms 04/3219: I am not certain what caused her symptoms, but she has seen Dr. Leonie Man. Defer to neurology on formal diagnosis.   Cardiac risk counseling and prevention recommendations: -recommend heart healthy/Mediterranean diet, with whole grains, fruits, vegetable, fish, lean meats, nuts, and olive oil. Limit salt. -recommend moderate walking, 3-5 times/week for 30-50 minutes each session. Aim for at least 150 minutes.week. Goal should be pace of 3 miles/hours, or walking 1.5 miles in 30 minutes -recommend avoidance of tobacco products. Avoid excess alcohol.  Plan for follow up: As needed  Buford Dresser, MD, PhD, [facility]    Medication Adjustments/Labs and Tests Ordered: Current medicines are reviewed at length with the patient today.  Concerns regarding medicines are outlined above.  No orders of the defined types were placed in this encounter.  No orders of the defined types were placed in this encounter.   Patient Instructions  Medication Instructions:  Your Physician recommend you continue on your current medication  as directed.    *If you need a refill on your cardiac medications before your next appointment, please call your pharmacy*  Follow-Up: At [facility], you and your health needs are our priority.  As part of our continuing mission to provide you with exceptional heart care, we have created designated Provider Care Teams.  These Care Teams include your primary Cardiologist (physician) and Advanced Practice Providers (APPs -  Physician Assistants and Nurse Practitioners) who all work together to provide you with the care you need, when you need it.  We recommend signing up for the patient portal called "MyChart".  Sign up information is provided on this After Visit Summary.  MyChart is used to connect with patients for Virtual Visits (Telemedicine).  Patients are able to view lab/test results,  encounter notes, upcoming appointments, etc.  Non-urgent messages can be sent to your provider as well.   To learn more about what you can do with MyChart, go to [email or web address].    Your next appointment:   Call us if you need [place]   Signed, Buford Dresser, MD PhD 10/23/2022     [place]

## 2022-10-28 ENCOUNTER — Encounter: Payer: Self-pay | Admitting: Gastroenterology

## 2022-10-28 ENCOUNTER — Ambulatory Visit (AMBULATORY_SURGERY_CENTER): Payer: Medicare Other | Admitting: Gastroenterology

## 2022-10-28 VITALS — BP 97/53 | HR 60 | Temp 97.5°F | Resp 26 | Ht 66.0 in | Wt 131.0 lb

## 2022-10-28 DIAGNOSIS — Z1211 Encounter for screening for malignant neoplasm of colon: Secondary | ICD-10-CM | POA: Diagnosis not present

## 2022-10-28 DIAGNOSIS — D125 Benign neoplasm of sigmoid colon: Secondary | ICD-10-CM

## 2022-10-28 DIAGNOSIS — D122 Benign neoplasm of ascending colon: Secondary | ICD-10-CM

## 2022-10-28 DIAGNOSIS — K5140 Inflammatory polyps of colon without complications: Secondary | ICD-10-CM | POA: Diagnosis not present

## 2022-10-28 MED ORDER — SODIUM CHLORIDE 0.9 % IV SOLN: 500.0000 mL | INTRAVENOUS | Status: DC

## 2022-10-28 NOTE — Op Note (Signed)
[place] Patient Name: Lauren Calhoun Procedure Date: 10/28/2022 8:32 AM MRN: [id number] Endoscopist: Remo Lipps P. Havery Moros , MD, [id number] Age: 72 Referring MD:  Date of Birth: [birth year] Gender: Female Account #: [id number] Procedure:                Colonoscopy Indications:              Screening for colorectal malignant neoplasm Medicines:                Monitored Anesthesia Care Procedure:                Pre-Anesthesia Assessment:                           - Prior to the procedure, a History and Physical                            was performed, and patient medications and                            allergies were reviewed. The patient's tolerance of                            previous anesthesia was also reviewed. The risks                            and benefits of the procedure and the sedation                            options and risks were discussed with the patient.                            All questions were answered, and informed consent                            was obtained. Prior Anticoagulants: The patient has                            taken no anticoagulant or antiplatelet agents. ASA                            Grade Assessment: II - A patient with mild systemic                            disease. After reviewing the risks and benefits,                            the patient was deemed in satisfactory condition to                            undergo the procedure.                           After obtaining informed consent, the colonoscope  was passed under direct vision. Throughout the                            procedure, the patient's blood pressure, pulse, and                            oxygen saturations were monitored continuously. The                            Olympus PCF-H190DL ([id number]) Colonoscope was                            introduced through the anus and advanced to the the                            cecum,  identified by appendiceal orifice and                            ileocecal valve. The colonoscopy was performed                            without difficulty. The patient tolerated the                            procedure well. The quality of the bowel                            preparation was good. The ileocecal valve,                            appendiceal orifice, and rectum were photographed. Scope In: 8:39:24 AM Scope Out: 9:02:12 AM Scope Withdrawal Time: 0 hours 13 minutes 19 seconds  Total Procedure Duration: 0 hours 22 minutes 48 seconds  Findings:                 The perianal and digital rectal examinations were                            normal.                           A 3 to 4 mm polyp was found in the ascending colon.                            The polyp was flat. The polyp was removed with a                            cold snare. Resection and retrieval were complete.                           A 4 mm polyp was found in the sigmoid colon. The                            polyp was sessile. The polyp  was removed with a                            cold snare. Resection and retrieval were complete.                           Multiple small-mouthed diverticula were found in                            the left colon.                           The colon was extremely tortuous.                           Internal hemorrhoids were found during                            retroflexion. The hemorrhoids were small.                           The exam was otherwise without abnormality. Complications:            No immediate complications. Estimated blood loss:                            Minimal. Estimated Blood Loss:     Estimated blood loss was minimal. Impression:               - One 3 to 4 mm polyp in the ascending colon,                            removed with a cold snare. Resected and retrieved.                           - One 4 mm polyp in the sigmoid colon, removed with                             a cold snare. Resected and retrieved.                           - Diverticulosis in the left colon.                           - Tortuous colon.                           - Internal hemorrhoids.                           - The examination was otherwise normal. Recommendation:           - Patient has a contact number available for                            emergencies. The signs and symptoms of potential  delayed complications were discussed with the                            patient. Return to normal activities tomorrow.                            Written discharge instructions were provided to the                            patient.                           - Resume previous diet.                           - Continue present medications.                           - Await pathology results. Remo Lipps P. Cyree Chuong, MD 10/28/2022 9:06:34 AM This report has been signed electronically.

## 2022-10-28 NOTE — Progress Notes (Signed)
[facility] History and Physical   Primary Care Physician:  Elby Showers, MD   Reason for Procedure:   Colon cancer screening  Plan:    colonoscopy     HPI: Lauren Calhoun is a 72 y.o. female  here for colonoscopy screening - last exam with Dr. Earlean Shawl without polyps in 2018 but he told her to repeat it in 5 years due to her history of breast and ovarian cancer. She has had negative genetic testing for this.   Patient denies any bowel symptoms at this time. No family history of colon cancer known. Otherwise feels well without any cardiopulmonary symptoms.   I have discussed risks / benefits of anesthesia and endoscopic procedure with [name] and they wish to proceed with the exams as outlined today.    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,sential node   Ovarian cancer ([place])    Tear of meniscus of knee joint    presently has a tear left knee    Past Surgical History:  Procedure Laterality Date   ABDOMINAL HYSTERECTOMY  08/23/2012   Procedure: HYSTERECTOMY ABDOMINAL;  Surgeon: Alvino Chapel, MD;  Location: [facility];  Service: Gynecology;  Laterality: N/A;  Perineal biopsies   APPENDECTOMY  AGE 72   BREAST LUMPECTOMY  2005   RIGHT   LYMPHADENECTOMY  08/23/2012   Procedure: LYMPHADENECTOMY;  Surgeon: Alvino Chapel, MD;  Location: [facility];  Service: Gynecology;;  Pelvic and para-aortic   OMENTECTOMY  08/23/2012   Procedure: OMENTECTOMY;  Surgeon: Alvino Chapel, MD;  Location: [facility];  Service: Gynecology;;   SALPINGOOPHORECTOMY  08/23/2012   Procedure: SALPINGO OOPHORECTOMY;  Surgeon: Alvino Chapel, MD;  Location: [facility];  Service: Gynecology;  Laterality: Bilateral;    Prior to Admission medications   Not on File    No current outpatient medications on file.   Current Facility-Administered Medications  Medication Dose Route Frequency Provider Last Rate Last Admin   0.9 %  sodium chloride  infusion  500 mL Intravenous Continuous Lisa-Marie Rueger, Carlota Raspberry, MD        Allergies as of 10/28/2022   (No Known Allergies)    Family History  Problem Relation Age of Onset   Cancer Mother        UTERINE AGE 86   Cancer Sister        BREAST AGE 66   Cancer Maternal Aunt        Ovarian cancer   Colon cancer Neg Hx    Colon polyps Neg Hx    Diabetes Neg Hx    Rectal cancer Neg Hx    Stomach cancer Neg Hx     Social History   Socioeconomic History   Marital status: Married    Spouse name: Not on file   Number of children: Not on file   Years of education: Not on file   Highest education level: Not on file  Occupational History   Not on file  Tobacco Use   Smoking status: Never   Smokeless tobacco: Never  Vaping Use   Vaping Use: Never used  Substance and Sexual Activity   Alcohol use: Yes    Comment: RARE MAYBE 2X MONTH   Drug use: No   Sexual activity: Yes  Other Topics Concern   Not on file  Social History Narrative   Not on file   Social Determinants of Health   Financial Resource Strain: Not on  file  Food Insecurity: Not on file  Transportation Needs: Not on file  Physical Activity: Not on file  Stress: Not on file  Social Connections: Not on file  Intimate Partner Violence: Not on file    Review of Systems: All other review of systems negative except as mentioned in the HPI.  Physical Exam: Vital signs BP 136/64   Pulse (!) 58   Temp (!) 97.5 F (36.4 C) (Temporal)   Ht '5\' 6"'$  (1.676 m)   Wt 131 lb (59.4 kg)   SpO2 100%   BMI 21.14 kg/m   General:   Alert,  Well-developed, pleasant and cooperative in NAD Lungs:  Clear throughout to auscultation.   Heart:  Regular rate and rhythm Abdomen:  Soft, nontender and nondistended.   Neuro/Psych:  Alert and cooperative. Normal mood and affect. A and O x 3  Jolly Mango, MD [facility]

## 2022-10-28 NOTE — Patient Instructions (Signed)
Await pathology results.  Handout on polyps, diverticulosis, and hemorrhoids provided.  YOU HAD AN ENDOSCOPIC PROCEDURE TODAY AT [facility]:   Refer to the procedure report that was given to you for any specific questions about what was found during the examination.  If the procedure report does not answer your questions, please call your gastroenterologist to clarify.  If you requested that your care partner not be given the details of your procedure findings, then the procedure report has been included in a sealed envelope for you to review at your convenience later.  YOU SHOULD EXPECT: Some feelings of bloating in the abdomen. Passage of more gas than usual.  Walking can help get rid of the air that was put into your GI tract during the procedure and reduce the bloating. If you had a lower endoscopy (such as a colonoscopy or flexible sigmoidoscopy) you may notice spotting of blood in your stool or on the toilet paper. If you underwent a bowel prep for your procedure, you may not have a normal bowel movement for a few days.  Please Note:  You might notice some irritation and congestion in your nose or some drainage.  This is from the oxygen used during your procedure.  There is no need for concern and it should clear up in a day or so.  SYMPTOMS TO REPORT IMMEDIATELY:  Following lower endoscopy (colonoscopy or flexible sigmoidoscopy):  Excessive amounts of blood in the stool  Significant tenderness or worsening of abdominal pains  Swelling of the abdomen that is new, acute  Fever of 100F or higher   For urgent or emergent issues, a gastroenterologist can be reached at any hour by calling [phone]. Do not use MyChart messaging for urgent concerns.    DIET:  We do recommend a small meal at first, but then you may proceed to your regular diet.  Drink plenty of fluids but you should avoid alcoholic beverages for 24 hours.  ACTIVITY:  You should plan to take it easy for  the rest of today and you should NOT DRIVE or use heavy machinery until tomorrow (because of the sedation medicines used during the test).    FOLLOW UP: Our staff will call the number listed on your records the next business day following your procedure.  We will call around 7:15- 8:00 am to check on you and address any questions or concerns that you may have regarding the information given to you following your procedure. If we do not reach you, we will leave a message.     If any biopsies were taken you will be contacted by phone or by letter within the next 1-3 weeks.  Please call us at [phone] if you have not heard about the biopsies in 3 weeks.    SIGNATURES/CONFIDENTIALITY: You and/or your care partner have signed paperwork which will be entered into your electronic medical record.  These signatures attest to the fact that that the information above on your After Visit Summary has been reviewed and is understood.  Full responsibility of the confidentiality of this discharge information lies with you and/or your care-partner.

## 2022-10-28 NOTE — Progress Notes (Signed)
Called to room to assist during endoscopic procedure.  Patient ID and intended procedure confirmed with present staff. Received instructions for my participation in the procedure from the performing physician.  

## 2022-10-28 NOTE — Progress Notes (Signed)
Pt's states no medical or surgical changes since previsit or office visit. 

## 2022-10-29 ENCOUNTER — Telehealth: Payer: Self-pay

## 2022-10-29 NOTE — Telephone Encounter (Signed)
  Follow up Call-     10/28/2022    7:37 AM  Call back number  Post procedure Call Back phone  # [phone]  Permission to leave phone message Yes     Patient questions:  Do you have a fever, pain , or abdominal swelling? No. Pain Score  0 *  Have you tolerated food without any problems? Yes.    Have you been able to return to your normal activities? Yes.    Do you have any questions about your discharge instructions: Diet   No. Medications  No. Follow up visit  No.  Do you have questions or concerns about your Care? No.  Actions: * If pain score is 4 or above: No action needed, pain <4.

## 2022-11-02 DIAGNOSIS — M79605 Pain in left leg: Secondary | ICD-10-CM | POA: Diagnosis not present

## 2022-11-02 DIAGNOSIS — R269 Unspecified abnormalities of gait and mobility: Secondary | ICD-10-CM | POA: Diagnosis not present

## 2022-11-19 DIAGNOSIS — Z23 Encounter for immunization: Secondary | ICD-10-CM | POA: Diagnosis not present

## 2022-11-26 ENCOUNTER — Telehealth: Payer: Self-pay | Admitting: Internal Medicine

## 2022-11-26 NOTE — Telephone Encounter (Signed)
Talked to Tedra to get a little clarification on the labs she is talking about. She said there was some labs done on 07/17/2022 ENA-RNP-Ab that was 2.8 by Dr Felecia Shelling who was filling in for Dr Leonie Man and she was told those needed to redone, so she wanted to know what you thought about that. Do you want me to schedule for this afternoon to see about ears or tomorrow morning when she can get labs?

## 2022-11-26 NOTE — Telephone Encounter (Signed)
Lauren Calhoun [phone]  [name] called with 2 things  1 -- she states there her right ear still feels clogged.  2 -- she wants to talk to you about some labs the neurologist has ordered.

## 2022-11-26 NOTE — Telephone Encounter (Signed)
scheduled

## 2022-11-26 NOTE — Telephone Encounter (Signed)
LVM to CB, wanted to check and see if these are labs that have already been done or need to be done.

## 2022-11-27 ENCOUNTER — Encounter: Payer: Self-pay | Admitting: Internal Medicine

## 2022-11-27 ENCOUNTER — Ambulatory Visit (INDEPENDENT_AMBULATORY_CARE_PROVIDER_SITE_OTHER): Payer: Medicare Other | Admitting: Internal Medicine

## 2022-11-27 VITALS — BP 104/72 | HR 67 | Temp 98.7°F | Ht 66.0 in | Wt 138.0 lb

## 2022-11-27 DIAGNOSIS — H6691 Otitis media, unspecified, right ear: Secondary | ICD-10-CM | POA: Diagnosis not present

## 2022-11-27 DIAGNOSIS — H6501 Acute serous otitis media, right ear: Secondary | ICD-10-CM | POA: Diagnosis not present

## 2022-11-27 DIAGNOSIS — I2510 Atherosclerotic heart disease of native coronary artery without angina pectoris: Secondary | ICD-10-CM

## 2022-11-27 MED ORDER — METHYLPREDNISOLONE ACETATE 80 MG/ML IJ SUSP
80.0000 mg | Freq: Once | INTRAMUSCULAR | Status: AC
  Administered 2022-11-27: 80 mg via INTRAMUSCULAR

## 2022-11-27 MED ORDER — AZITHROMYCIN 250 MG PO TABS: ORAL_TABLET | ORAL | 0 refills | Status: AC

## 2022-11-27 NOTE — Progress Notes (Signed)
Patient Care Team: Elby Showers, MD as PCP - General (Internal Medicine) Buford Dresser, MD as PCP - Cardiology (Cardiology)  Visit Date: 11/27/22  Subjective:    Patient ID: Lauren Calhoun , Female   DOB: 03/30/1951, 72 y.o.    MRN: [id number]   72 y.o. Female presents today for right ear pain. Patient has a past medical history of ovarian cancer, meniscus tear.  Experiencing continued right ear pain, hoarseness, post nasal drip, intermittent cough since 10/29/22. Believes it might be swimmer's ear.   Past Medical History:  Diagnosis Date   Cancer ([place])    Breast,sential node   Ovarian cancer ([place])    Tear of meniscus of knee joint    presently has a tear left knee     Family History  Problem Relation Age of Onset   Cancer Mother        UTERINE AGE 87   Cancer Sister        BREAST AGE 22   Cancer Maternal Aunt        Ovarian cancer   Colon cancer Neg Hx    Colon polyps Neg Hx    Diabetes Neg Hx    Rectal cancer Neg Hx    Stomach cancer Neg Hx     Social History   Social History Narrative   Not on file      Review of Systems  Constitutional:  Negative for fever and malaise/fatigue.  HENT:  Positive for ear pain (Right). Negative for congestion.        (+) Hoarseness (+) Post nasal drip  Eyes:  Negative for blurred vision.  Respiratory:  Positive for cough (Intermittent). Negative for shortness of breath.   Cardiovascular:  Negative for chest pain, palpitations and leg swelling.  Gastrointestinal:  Negative for vomiting.  Musculoskeletal:  Negative for back pain.  Skin:  Negative for rash.  Neurological:  Negative for loss of consciousness and headaches.        Objective:   Vitals: BP 104/72   Pulse 67   Temp 98.7 F (37.1 C) (Tympanic)   Ht '5\' 6"'$  (1.676 m)   Wt 138 lb (62.6 kg)   SpO2 99%   BMI 22.27 kg/m    Physical Exam Vitals and nursing note reviewed.  Constitutional:      General: She is not in acute distress.     Appearance: Normal appearance. She is not toxic-appearing.  HENT:     Head: Normocephalic and atraumatic.     Right Ear: Hearing, ear canal and external ear normal. Tympanic membrane is not erythematous.     Left Ear: Hearing, tympanic membrane, ear canal and external ear normal.     Ears:     Comments: Good light reflex, some fluid behind right TM, no erythema. Left TM normal. Pulmonary:     Effort: Pulmonary effort is normal.  Skin:    General: Skin is warm and dry.  Neurological:     Mental Status: She is alert and oriented to person, place, and time. Mental status is at baseline.  Psychiatric:        Mood and Affect: Mood normal.        Behavior: Behavior normal.        Thought Content: Thought content normal.        Judgment: Judgment normal.       Results:   Studies obtained and personally reviewed by me:   Labs:       Component  Value Date/Time   NA 141 06/10/2022 1123   NA 145 08/07/2016 1211   K 3.8 06/10/2022 1123   K 4.1 08/07/2016 1211   CL 107 06/10/2022 1123   CO2 26 06/10/2022 1123   CO2 29 08/07/2016 1211   GLUCOSE 101 (H) 06/10/2022 1123   GLUCOSE 96 08/07/2016 1211   BUN 15 06/10/2022 1123   BUN 12.8 08/07/2016 1211   CREATININE 0.97 06/10/2022 1123   CREATININE 0.94 03/16/2022 1124   CREATININE 0.9 08/07/2016 1211   CALCIUM 9.9 06/10/2022 1123   CALCIUM 10.3 08/07/2016 1211   PROT 7.3 06/10/2022 1123   PROT 7.1 08/07/2016 1212   PROT 7.7 08/07/2016 1211   ALBUMIN 4.6 06/10/2022 1123   ALBUMIN 4.3 08/07/2016 1211   AST 22 06/10/2022 1123   AST 23 08/07/2016 1211   ALT 12 06/10/2022 1123   ALT 18 08/07/2016 1211   ALKPHOS 75 06/10/2022 1123   ALKPHOS 93 08/07/2016 1211   BILITOT 0.8 06/10/2022 1123   BILITOT 0.98 08/07/2016 1211   GFRNONAA >60 06/10/2022 1123   GFRNONAA 62 03/14/2021 0918   GFRAA 72 03/14/2021 0918     Lab Results  Component Value Date   WBC 3.6 (L) 06/10/2022   HGB 13.8 06/10/2022   HCT 41.2 06/10/2022   MCV 89.2  06/10/2022   PLT 201 06/10/2022    Lab Results  Component Value Date   CHOL 204 (H) 09/25/2022   HDL 82 09/25/2022   LDLCALC 110 (H) 09/25/2022   TRIG 65 09/25/2022   CHOLHDL 2.5 09/25/2022    Lab Results  Component Value Date   HGBA1C 5.5 06/11/2022     Lab Results  Component Value Date   TSH 2.12 03/16/2022      Assessment & Plan:   Protracted right otitis media: prescribed azithromycin 250 mg two tablets day 1 and one tablet days 2-5. Administered Depo 80 mg IM.    I,Alexander Ruley,acting as a Education administrator for Elby Showers, MD.,have documented all relevant documentation on the behalf of Elby Showers, MD,as directed by  Elby Showers, MD while in the presence of Elby Showers, MD.   I, Elby Showers, MD, have reviewed all documentation for this visit. The documentation on 12/01/22 for the exam, diagnosis, procedures, and orders are all accurate and complete.

## 2022-12-01 NOTE — Patient Instructions (Signed)
Depomedrol 80 mg IM. Take Zithromax as directed for otitis media.

## 2022-12-04 DIAGNOSIS — R269 Unspecified abnormalities of gait and mobility: Secondary | ICD-10-CM | POA: Diagnosis not present

## 2022-12-04 DIAGNOSIS — M79605 Pain in left leg: Secondary | ICD-10-CM | POA: Diagnosis not present

## 2022-12-21 DIAGNOSIS — F4312 Post-traumatic stress disorder, chronic: Secondary | ICD-10-CM | POA: Diagnosis not present

## 2023-01-12 DIAGNOSIS — H6991 Unspecified Eustachian tube disorder, right ear: Secondary | ICD-10-CM | POA: Diagnosis not present

## 2023-01-12 DIAGNOSIS — H6591 Unspecified nonsuppurative otitis media, right ear: Secondary | ICD-10-CM | POA: Diagnosis not present

## 2023-02-08 DIAGNOSIS — F4312 Post-traumatic stress disorder, chronic: Secondary | ICD-10-CM | POA: Diagnosis not present

## 2023-03-11 DIAGNOSIS — H938X1 Other specified disorders of right ear: Secondary | ICD-10-CM | POA: Diagnosis not present

## 2023-03-15 NOTE — Progress Notes (Signed)
Annual Wellness Visit    Patient Care Team: Kay Shippy, Luanna Cole, MD as PCP - General (Internal Medicine) Jodelle Red, MD as PCP - Cardiology (Cardiology)  Visit Date: 03/22/23   Chief Complaint  Patient presents with   Medicare Wellness    Subjective:   Patient: Lauren Calhoun, Female    DOB: [birth year], 72 y.o.   MRN: [id number]  SERI TARPINIAN is a 72 y.o. Female who presents today for her Annual Wellness Visit.  She has history of stage Ic ovarian tumor and history of stage I right breast cancer.  She was referred here by Dr. Darnelle Catalan for primary care in 2012.  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  0 out of 1 sentinel lymph nodes involved.  She received Taxol/Herceptin followed by radiation all of which she completed in April 2006.  She was found to have bilateral ovarian neoplasms in 2013 with an elevated CEA of 125 by Dr. Leda Quail.  She underwent a total abdominal hysterectomy BSO and bilateral pelvic lymphadenectomy, periaortic lymphadenectomy, omentectomy, multiple peritoneal biopsies in November 2013.  All biopsies were negative for metastatic disease.  Pathology showed low malignant potential tumor with focal areas of microinvasion.  No adjuvant therapy was recommended.  She has been followed regularly since that time.  CA125 returned to normal.  History of osteopenia with bone density study -2.3 in 2017.  Has not wanted to be on bone sparing medication.  History of bladder dysfunction seen by Dr. Perley Jain in 2014.  No known drug allergies.  Right ankle fracture at age 5.  Appendectomy at age 76.  History of kidney stones seen on CT scan.  These have been asymptomatic.  Has been tested by Dr. Kieth Brightly in 2020 for complaint of mild cognitive defects but did well on testing.  Perhaps was related to chemotherapy in the past.   03/11/23  saw PA Sevier Spainhour for right ear fullness, sinus congestion. Given fluticasone nasal spray.  Glucose normal. Kidney, liver functions normal. Electrolytes normal. Blood proteins normal. WBC low at 3.2. CHOL elevated at 214, LDL at 114. Does not tolerate statins. TSH at 1.97.   Mammogram last competed 09/23/22. No mammographic evidence of malignancy. Recommended repeat in 2024.  Colonoscopy last completed 10/28/22. Showed one 3-4 mm polyp in ascending colon, one 4 mm polyp in sigmoid colon, diverticulosis in left colon, tortuous colon, internal hemorrhoids. Examination otherwise normal. Pathology showed inflammatory polyps. Recommended repeat in 2034.  Social history: She is married and has 3 adult daughters.  She is now retired.  Family history: Father died at age 5.  Mother died at age 41 of uterine cancer.  Sister died at age 34 of breast cancer.  1 sister with history of hypertension.  3 adult daughters in good health.   Past Medical History:  Diagnosis Date   Cancer (HCC)    Breast,sential node   Ovarian cancer (HCC)    Tear of meniscus of knee joint    presently has a tear left knee     Family History  Problem Relation Age of Onset   Cancer Mother        UTERINE AGE 4   Cancer Sister        BREAST AGE 53   Cancer Maternal Aunt        Ovarian cancer   Colon cancer Neg Hx    Colon  polyps Neg Hx    Diabetes Neg Hx    Rectal cancer Neg Hx    Stomach cancer Neg Hx      Social History   Social History Narrative   Not on file     Review of Systems  Constitutional:  Negative for chills, fever, malaise/fatigue and weight loss.  HENT:  Negative for hearing loss, sinus pain and sore throat.   Respiratory:  Negative for cough, hemoptysis and shortness of breath.   Cardiovascular:  Negative for chest pain, palpitations, leg swelling and PND.  Gastrointestinal:  Negative for abdominal pain, constipation, diarrhea, heartburn, nausea and vomiting.  Genitourinary:  Negative  for dysuria, frequency and urgency.  Musculoskeletal:  Negative for back pain, myalgias and neck pain.  Skin:  Negative for itching and rash.  Neurological:  Negative for dizziness, tingling, seizures and headaches.  Endo/Heme/Allergies:  Negative for polydipsia.  Psychiatric/Behavioral:  Negative for depression. The patient is not nervous/anxious.       Objective:   Vitals: BP 98/68   Pulse (!) 57   Temp 98.1 F (36.7 C) (Tympanic)   Resp 16   Ht 5\' 5"  (1.651 m)   Wt 136 lb 12 oz (62 kg)   SpO2 99%   BMI 22.76 kg/m   Physical Exam Vitals and nursing note reviewed.  Constitutional:      General: She is not in acute distress.    Appearance: Normal appearance. She is not ill-appearing or toxic-appearing.  HENT:     Head: Normocephalic and atraumatic.     Right Ear: Hearing, tympanic membrane, ear canal and external ear normal.     Left Ear: Hearing, tympanic membrane, ear canal and external ear normal.     Mouth/Throat:     Pharynx: Oropharynx is clear.  Eyes:     Extraocular Movements: Extraocular movements intact.     Pupils: Pupils are equal, round, and reactive to light.  Neck:     Thyroid: No thyroid mass, thyromegaly or thyroid tenderness.     Vascular: No carotid bruit.  Cardiovascular:     Rate and Rhythm: Normal rate and regular rhythm. No extrasystoles are present.    Pulses:          Dorsalis pedis pulses are 1+ on the right side and 1+ on the left side.     Heart sounds: Normal heart sounds. No murmur heard.    No friction rub. No gallop.  Pulmonary:     Effort: Pulmonary effort is normal.     Breath sounds: Normal breath sounds. No decreased breath sounds, wheezing, rhonchi or rales.  Chest:     Chest wall: No mass.  Abdominal:     Palpations: Abdomen is soft. There is no hepatomegaly, splenomegaly or mass.     Tenderness: There is no abdominal tenderness.     Hernia: No hernia is present.  Musculoskeletal:     Cervical back: Normal range of motion.      Right lower leg: No edema.     Left lower leg: No edema.  Lymphadenopathy:     Cervical: No cervical adenopathy.     Upper Body:     Right upper body: No supraclavicular adenopathy.     Left upper body: No supraclavicular adenopathy.  Skin:    General: Skin is warm and dry.  Neurological:     General: No focal deficit present.     Mental Status: She is alert and oriented to person, place, and time. Mental status is  at baseline.     Sensory: Sensation is intact.     Motor: Motor function is intact. No weakness.     Deep Tendon Reflexes: Reflexes are normal and symmetric.  Psychiatric:        Attention and Perception: Attention normal.        Mood and Affect: Mood normal.        Speech: Speech normal.        Behavior: Behavior normal.        Thought Content: Thought content normal.        Cognition and Memory: Cognition normal.        Judgment: Judgment normal.      Most recent functional status assessment:    03/22/2023   11:04 AM  In your present state of health, do you have any difficulty performing the following activities:  Hearing? 0  Vision? 0  Walking or climbing stairs? 0  Dressing or bathing? 0  Doing errands, shopping? 0  Using the Toilet? N  In the past six months, have you accidently leaked urine? N  Do you have problems with loss of bowel control? N  Managing your Medications? N  Managing your Finances? N  Housekeeping or managing your Housekeeping? N   Most recent fall risk assessment:    03/22/2023   11:04 AM  Fall Risk   Falls in the past year? 0  Number falls in past yr: 0  Injury with Fall? 0  Risk for fall due to : No Fall Risks  Follow up Falls evaluation completed    Most recent depression screenings:    03/22/2023   11:04 AM 11/27/2022   10:38 AM  PHQ 2/9 Scores  PHQ - 2 Score 0 0   Most recent cognitive screening:    03/22/2023   11:05 AM  6CIT Screen  What Year? 0 points  What month? 0 points  What time? 0 points  Count  back from 20 0 points  Months in reverse 0 points  Repeat phrase 2 points  Total Score 2 points     Results:   Studies obtained and personally reviewed by me:  Mammogram last competed 09/23/22. No mammographic evidence of malignancy. Recommended repeat in 2024.  Colonoscopy last completed 10/28/22. Showed one 3-4 mm polyp in ascending colon, one 4 mm polyp in sigmoid colon, diverticulosis in left colon, tortuous colon, internal hemorrhoids. Examination otherwise normal. Pathology showed inflammatory polyps. Recommended repeat in 2034.  Labs:       Component Value Date/Time   NA 142 03/19/2023 0912   NA 145 08/07/2016 1211   K 5.3 03/19/2023 0912   K 4.1 08/07/2016 1211   CL 107 03/19/2023 0912   CO2 29 03/19/2023 0912   CO2 29 08/07/2016 1211   GLUCOSE 85 03/19/2023 0912   GLUCOSE 96 08/07/2016 1211   BUN 17 03/19/2023 0912   BUN 12.8 08/07/2016 1211   CREATININE 0.85 03/19/2023 0912   CREATININE 0.9 08/07/2016 1211   CALCIUM 10.3 03/19/2023 0912   CALCIUM 10.3 08/07/2016 1211   PROT 6.7 03/19/2023 0912   PROT 7.1 08/07/2016 1212   PROT 7.7 08/07/2016 1211   ALBUMIN 4.6 06/10/2022 1123   ALBUMIN 4.3 08/07/2016 1211   AST 20 03/19/2023 0912   AST 23 08/07/2016 1211   ALT 16 03/19/2023 0912   ALT 18 08/07/2016 1211   ALKPHOS 75 06/10/2022 1123   ALKPHOS 93 08/07/2016 1211   BILITOT 0.9 03/19/2023 0912   BILITOT 0.98 08/07/2016  1211   GFRNONAA >60 06/10/2022 1123   GFRNONAA 62 03/14/2021 0918   GFRAA 72 03/14/2021 0918     Lab Results  Component Value Date   WBC 3.2 (L) 03/19/2023   HGB 14.0 03/19/2023   HCT 41.9 03/19/2023   MCV 90.1 03/19/2023   PLT 216 03/19/2023    Lab Results  Component Value Date   CHOL 214 (H) 03/19/2023   HDL 81 03/19/2023   LDLCALC 114 (H) 03/19/2023   TRIG 87 03/19/2023   CHOLHDL 2.6 03/19/2023    Lab Results  Component Value Date   HGBA1C 5.5 06/11/2022     Lab Results  Component Value Date   TSH 1.97 03/19/2023     Assessment & Plan:   Bladder dysfunction: seen by Dr. McDiarmid in 2014.  Mild elevation of lipids: referral for lipid clinic- Dr. Rennis Golden. CHOL elevated at 214, LDL at 114. Does not tolerate statins.   Will not check cancer markers this year after discussion.  Status post abdominal hysterectomy 2013.  Mammogram: last competed 09/23/22. No mammographic evidence of malignancy. Recommended repeat in 2024.  Colonoscopy: last completed 10/28/22. Showed one 3-4 mm polyp in ascending colon, one 4 mm polyp in sigmoid colon, diverticulosis in left colon, tortuous colon, internal hemorrhoids. Examination otherwise normal. Pathology showed inflammatory polyps. Recommended repeat in 2034.  Bone density study -2.3 in 2017.  Vaccine counseling: will go to pharmacy for shingles vaccine. UTD on flu, tetanus vaccines. Administered pneumococcal 20 vaccine.  Return in 1 year for health maintenance exam or as needed.     Annual wellness visit done today including the all of the following: Reviewed patient's Family Medical History Reviewed and updated list of patient's medical providers Assessment of cognitive impairment was done Assessed patient's functional ability Established a written schedule for health screening services Health Risk Assessent Completed and Reviewed  Discussed health benefits of physical activity, and encouraged her to engage in regular exercise appropriate for her age and condition.        I,Alexander Ruley,acting as a Neurosurgeon for Margaree Mackintosh, MD.,have documented all relevant documentation on the behalf of Margaree Mackintosh, MD,as directed by  Margaree Mackintosh, MD while in the presence of Margaree Mackintosh, MD.   I, Margaree Mackintosh, MD, have reviewed all documentation for this visit. The documentation on 04/03/23 for the exam, diagnosis, procedures, and orders are all accurate and complete.

## 2023-03-16 ENCOUNTER — Ambulatory Visit: Payer: Medicare Other | Admitting: Sports Medicine

## 2023-03-19 ENCOUNTER — Other Ambulatory Visit: Payer: Medicare Other

## 2023-03-19 DIAGNOSIS — E7800 Pure hypercholesterolemia, unspecified: Secondary | ICD-10-CM | POA: Diagnosis not present

## 2023-03-19 DIAGNOSIS — Z0000 Encounter for general adult medical examination without abnormal findings: Secondary | ICD-10-CM

## 2023-03-19 DIAGNOSIS — Z79899 Other long term (current) drug therapy: Secondary | ICD-10-CM

## 2023-03-19 DIAGNOSIS — Z1329 Encounter for screening for other suspected endocrine disorder: Secondary | ICD-10-CM

## 2023-03-20 LAB — COMPLETE METABOLIC PANEL WITH GFR
AG Ratio: 1.8 (calc) (ref 1.0–2.5)
ALT: 16 U/L (ref 6–29)
AST: 20 U/L (ref 10–35)
Albumin: 4.3 g/dL (ref 3.6–5.1)
Alkaline phosphatase (APISO): 94 U/L (ref 37–153)
BUN: 17 mg/dL (ref 7–25)
CO2: 29 mmol/L (ref 20–32)
Calcium: 10.3 mg/dL (ref 8.6–10.4)
Chloride: 107 mmol/L (ref 98–110)
Creat: 0.85 mg/dL (ref 0.60–1.00)
Globulin: 2.4 g/dL (calc) (ref 1.9–3.7)
Glucose, Bld: 85 mg/dL (ref 65–99)
Potassium: 5.3 mmol/L (ref 3.5–5.3)
Sodium: 142 mmol/L (ref 135–146)
Total Bilirubin: 0.9 mg/dL (ref 0.2–1.2)
Total Protein: 6.7 g/dL (ref 6.1–8.1)
eGFR: 73 mL/min/{1.73_m2} (ref >=60)

## 2023-03-20 LAB — CBC WITH DIFFERENTIAL/PLATELET
Absolute Monocytes: 253 cells/uL (ref 200–950)
Basophils Absolute: 10 cells/uL (ref 0–200)
Basophils Relative: 0.3 %
Eosinophils Absolute: 51 cells/uL (ref 15–500)
Eosinophils Relative: 1.6 %
HCT: 41.9 % (ref 35.0–45.0)
Hemoglobin: 14 g/dL (ref 11.7–15.5)
Lymphs Abs: 1318 cells/uL (ref 850–3900)
MCH: 30.1 pg (ref 27.0–33.0)
MCHC: 33.4 g/dL (ref 32.0–36.0)
MCV: 90.1 fL (ref 80.0–100.0)
MPV: 10.7 fL (ref 7.5–12.5)
Monocytes Relative: 7.9 %
Neutro Abs: 1568 cells/uL (ref 1500–7800)
Neutrophils Relative %: 49 %
Platelets: 216 10*3/uL (ref 140–400)
RBC: 4.65 10*6/uL (ref 3.80–5.10)
RDW: 12.5 % (ref 11.0–15.0)
Total Lymphocyte: 41.2 %
WBC: 3.2 10*3/uL — ABNORMAL LOW (ref 3.8–10.8)

## 2023-03-20 LAB — LIPID PANEL
Cholesterol: 214 mg/dL — ABNORMAL HIGH (ref <200)
HDL: 81 mg/dL (ref >=50)
LDL Cholesterol (Calc): 114 mg/dL (calc) — ABNORMAL HIGH
Non-HDL Cholesterol (Calc): 133 mg/dL (calc) — ABNORMAL HIGH (ref <130)
Total CHOL/HDL Ratio: 2.6 (calc) (ref <5.0)
Triglycerides: 87 mg/dL (ref <150)

## 2023-03-20 LAB — TSH: TSH: 1.97 mIU/L (ref 0.40–4.50)

## 2023-03-22 ENCOUNTER — Encounter: Payer: Self-pay | Admitting: Internal Medicine

## 2023-03-22 ENCOUNTER — Ambulatory Visit (INDEPENDENT_AMBULATORY_CARE_PROVIDER_SITE_OTHER): Payer: Medicare Other | Admitting: Internal Medicine

## 2023-03-22 VITALS — BP 98/68 | HR 57 | Temp 98.1°F | Resp 16 | Ht 65.0 in | Wt 136.8 lb

## 2023-03-22 DIAGNOSIS — Z8543 Personal history of malignant neoplasm of ovary: Secondary | ICD-10-CM

## 2023-03-22 DIAGNOSIS — E7800 Pure hypercholesterolemia, unspecified: Secondary | ICD-10-CM | POA: Diagnosis not present

## 2023-03-22 DIAGNOSIS — Z853 Personal history of malignant neoplasm of breast: Secondary | ICD-10-CM

## 2023-03-22 DIAGNOSIS — Z23 Encounter for immunization: Secondary | ICD-10-CM

## 2023-03-22 DIAGNOSIS — M8580 Other specified disorders of bone density and structure, unspecified site: Secondary | ICD-10-CM | POA: Diagnosis not present

## 2023-03-22 DIAGNOSIS — Z0000 Encounter for general adult medical examination without abnormal findings: Secondary | ICD-10-CM

## 2023-03-22 LAB — POCT URINALYSIS DIPSTICK
Bilirubin, UA: NEGATIVE
Blood, UA: NEGATIVE
Glucose, UA: NEGATIVE
Ketones, UA: NEGATIVE
Leukocytes, UA: NEGATIVE
Nitrite, UA: NEGATIVE
Protein, UA: NEGATIVE
Spec Grav, UA: 1.015 (ref 1.010–1.025)
Urobilinogen, UA: 0.2 E.U./dL
pH, UA: 6.5 (ref 5.0–8.0)

## 2023-04-03 NOTE — Patient Instructions (Addendum)
It was a pleasure to see you today. Labs are stable. RTC in one year or as needed. Referral to Dr. Blanchie Dessert lipid clinic. Have Shingles vaccine. Given pneumococcal vaccine here today.

## 2023-04-14 ENCOUNTER — Encounter (HOSPITAL_BASED_OUTPATIENT_CLINIC_OR_DEPARTMENT_OTHER): Payer: Self-pay | Admitting: Pharmacist Clinician (PhC)/ Clinical Pharmacy Specialist

## 2023-04-14 ENCOUNTER — Ambulatory Visit (HOSPITAL_BASED_OUTPATIENT_CLINIC_OR_DEPARTMENT_OTHER): Payer: Medicare Other | Admitting: Pharmacist Clinician (PhC)/ Clinical Pharmacy Specialist

## 2023-04-14 DIAGNOSIS — E785 Hyperlipidemia, unspecified: Secondary | ICD-10-CM | POA: Diagnosis not present

## 2023-04-14 MED ORDER — ATORVASTATIN CALCIUM 10 MG PO TABS: ORAL_TABLET | ORAL | 3 refills | Status: DC

## 2023-04-14 NOTE — Patient Instructions (Signed)
Your Results:             Your most recent labs Goal  Total Cholesterol 214 < 200  Triglycerides 87 < 150  HDL (happy/good cholesterol) 81 > 40  LDL (lousy/bad cholesterol 114 < 70   Medication changes:   You will take atorvastatin 10 mg twice weekly.    Lab orders:  We want to repeat labs after 2-3 months.  We will send you a lab order to remind you once we get closer to that time.     Thank you for choosing [facility]

## 2023-04-14 NOTE — Progress Notes (Signed)
   Office Visit    Patient Name: Lauren Calhoun Date of Encounter: 04/14/2023  Primary Care Provider:  Margaree Mackintosh, MD Primary Cardiologist:  Jodelle Red, MD  Chief Complaint    Hyperlipidemia   Significant Past Medical History   CAD Calcium score 167 (79th percentile); mostly in LAD     No Known Allergies  History of Present Illness    Lauren Calhoun is a 72 y.o. female patient of Dr Cristal Deer, in the office to discuss options for cholesterol management.    Insurance Carrier:  Personal assistant PDP ([employer])  LDL Cholesterol goal:  LDL <70  Current Medications: none  Previously tried:  rosuvastatin  - out of body experience, headache  Family Hx:  mother died at 59- cancer, dad died at 65, some heart disease in dad's family but not until late 68's or 5's ;  3 sisters - one died at 35 breast cancer others living, younger one 4122) with heart issues (heavy drinker, overweight); 3 daughters healthy  Social Hx: Tobacco: no  Alcohol: occasionally (shot of Baileys)  Diet:   rarely eats out, 99% at home, Mediterranean diet, high in vegetables/fruits/grains; occasional treats   Exercise: aerobics 40 min 4 x week, power yoga 4 x week, walks 4-7 miles 3-4 times week, occasional swim  Accessory Clinical Findings   Lab Results  Component Value Date   CHOL 214 (H) 03/19/2023   HDL 81 03/19/2023   LDLCALC 114 (H) 03/19/2023   TRIG 87 03/19/2023   CHOLHDL 2.6 03/19/2023    No results found for: "LIPOA"  Lab Results  Component Value Date   ALT 16 03/19/2023   AST 20 03/19/2023   ALKPHOS 75 06/10/2022   BILITOT 0.9 03/19/2023   Lab Results  Component Value Date   CREATININE 0.85 03/19/2023   BUN 17 03/19/2023   NA 142 03/19/2023   K 5.3 03/19/2023   CL 107 03/19/2023   CO2 29 03/19/2023   Lab Results  Component Value Date   HGBA1C 5.5 06/11/2022    Home Medications    No current outpatient medications on file.   No current  facility-administered medications for this visit.     Assessment & Plan    Hyperlipidemia LDL goal <70 Assessment: Patient with ASCVD not at LDL goal of < 70 Most recent LDL 114 on 03/19/23 Not able to tolerate rosuvastatin secondary to out of body experience Reviewed options for lowering LDL cholesterol, including ezetimibe, PCSK-9 inhibitors, bempedoic acid and inclisiran.  Discussed mechanisms of action, dosing, side effects, potential decreases in LDL cholesterol and costs.  Also reviewed potential options for patient assistance.  Plan: Patient has not failed 2 different statin drugs. Will start atorvastatin 10 mg twice weekly Repeat labs after:  2-3 months Lipid Liver  If tolerating will titrate up to reach desired LDL goal, if not tolerated will consider Repatha 140 mg    Phillips Hay, PharmD CPP [facility] [address]  [place], [place] [address] [phone]  04/14/2023, 4:46 PM

## 2023-04-14 NOTE — Assessment & Plan Note (Signed)
Assessment: Patient with ASCVD not at LDL goal of < 70 Most recent LDL 114 on 03/19/23 Not able to tolerate rosuvastatin secondary to out of body experience Reviewed options for lowering LDL cholesterol, including ezetimibe, PCSK-9 inhibitors, bempedoic acid and inclisiran.  Discussed mechanisms of action, dosing, side effects, potential decreases in LDL cholesterol and costs.  Also reviewed potential options for patient assistance.  Plan: Patient has not failed 2 different statin drugs. Will start atorvastatin 10 mg twice weekly Repeat labs after:  2-3 months Lipid Liver  If tolerating will titrate up to reach desired LDL goal, if not tolerated will consider Repatha 140 mg

## 2023-06-11 ENCOUNTER — Other Ambulatory Visit: Payer: Self-pay

## 2023-06-11 ENCOUNTER — Emergency Department (HOSPITAL_COMMUNITY)
Admission: EM | Admit: 2023-06-11 | Discharge: 2023-06-11 | Disposition: A | Discharge status: Home / Self Care | Payer: Medicare Other | Attending: Emergency Medicine | Admitting: Emergency Medicine

## 2023-06-11 ENCOUNTER — Emergency Department (HOSPITAL_COMMUNITY): Payer: Medicare Other

## 2023-06-11 ENCOUNTER — Telehealth: Payer: Self-pay | Admitting: Internal Medicine

## 2023-06-11 ENCOUNTER — Encounter (HOSPITAL_COMMUNITY): Payer: Self-pay | Admitting: Emergency Medicine

## 2023-06-11 DIAGNOSIS — R079 Chest pain, unspecified: Secondary | ICD-10-CM

## 2023-06-11 DIAGNOSIS — I771 Stricture of artery: Secondary | ICD-10-CM | POA: Diagnosis not present

## 2023-06-11 DIAGNOSIS — R0789 Other chest pain: Secondary | ICD-10-CM | POA: Diagnosis not present

## 2023-06-11 DIAGNOSIS — C569 Malignant neoplasm of unspecified ovary: Secondary | ICD-10-CM | POA: Diagnosis not present

## 2023-06-11 DIAGNOSIS — C50919 Malignant neoplasm of unspecified site of unspecified female breast: Secondary | ICD-10-CM | POA: Diagnosis not present

## 2023-06-11 LAB — BASIC METABOLIC PANEL
Anion gap: 7 (ref 5–15)
BUN: 20 mg/dL (ref 8–23)
CO2: 26 mmol/L (ref 22–32)
Calcium: 10.1 mg/dL (ref 8.9–10.3)
Chloride: 105 mmol/L (ref 98–111)
Creatinine, Ser: 0.75 mg/dL (ref 0.44–1.00)
GFR, Estimated: >60 mL/min (ref >60)
Glucose, Bld: 104 mg/dL — ABNORMAL HIGH (ref 70–99)
Potassium: 3.6 mmol/L (ref 3.5–5.1)
Sodium: 138 mmol/L (ref 135–145)

## 2023-06-11 LAB — CBC
HCT: 43 % (ref 36.0–46.0)
Hemoglobin: 14 g/dL (ref 12.0–15.0)
MCH: 30.2 pg (ref 26.0–34.0)
MCHC: 32.6 g/dL (ref 30.0–36.0)
MCV: 92.9 fL (ref 80.0–100.0)
Platelets: 211 10*3/uL (ref 150–400)
RBC: 4.63 MIL/uL (ref 3.87–5.11)
RDW: 12.4 % (ref 11.5–15.5)
WBC: 4.3 10*3/uL (ref 4.0–10.5)
nRBC: 0 % (ref 0.0–0.2)

## 2023-06-11 LAB — TROPONIN I (HIGH SENSITIVITY)
Troponin I (High Sensitivity): 3 ng/L (ref <18)
Troponin I (High Sensitivity): 3 ng/L (ref <18)

## 2023-06-11 NOTE — ED Triage Notes (Signed)
Pt reports while driving today she had chest pain that lasted approx 5 minutes. Denies SHOB, diaphoresis, and nausea. Denies chest pain at this time.

## 2023-06-11 NOTE — Discharge Instructions (Signed)
If you develop recurrent, continued, or worsening chest pain, shortness of breath, fever, vomiting, abdominal or back pain, or any other new/concerning symptoms then return to the ER for evaluation.  

## 2023-06-11 NOTE — Telephone Encounter (Signed)
Lauren Calhoun [phone]  [name] called to say she was driving and started having chest pain and it moved into her right arm, I did make sure she was not having any other symptoms like headache, dizziness, numbness, (no other symptoms) she has stopped on side of road in a safe place. She is about 45 minutes from [place] and wanted to know what she should do. She does have someone headed her way.

## 2023-06-11 NOTE — Telephone Encounter (Signed)
Called back to check on patient, she said EMS can checked her out and said EKG was good and she would be okay to continue on and go get blood work and be checked out. She was advised thru me per Dr Lenord Fellers to go to Emergency Department to be evaluated not an Urgent Care. She verbalized understanding and agreed to go.

## 2023-06-11 NOTE — Telephone Encounter (Signed)
After speaking to Dr. Lenord Fellers she advised that [facility], so that the ambulance can come and evaluate her and decide if she needs to be taking to nearest hospital or is safe to travel on. I let patient know this and she verbalized understanding.

## 2023-06-11 NOTE — ED Provider Notes (Signed)
[facility] AT [facility] Provider Note   CSN: [id number] Arrival date & time: 06/11/23  1747     History  Chief Complaint  Patient presents with   Chest Pain    Lauren Calhoun is a 72 y.o. female.  HPI 72 year old female presents with chest pain.  At around 3 PM she was driving and felt a sudden onset of chest discomfort that felt sharp, tight, squeezing.  It was across her entire chest but mostly in the middle.  Lasted about 5 minutes.  It then came back and lasted another 5 minutes or so.  Pulled over and called EMS who checked her out and said she was appearing well and her EKG was okay.  During the episode she did have some right arm tingling and some right neck discomfort.  All of her symptoms are resolved and have been for the last couple hours.  No shortness of breath or diaphoresis or nausea/vomiting associated with it.  She was recently put on Lipitor at a low dose but denies a history of other medical problems.  Home Medications Prior to Admission medications   Medication Sig Start Date End Date Taking? Authorizing Provider  atorvastatin (LIPITOR) 10 MG tablet Take 1 tablet by mouth twice weekly 04/14/23   Jodelle Red, MD      Allergies    Patient has no known allergies.    Review of Systems   Review of Systems  Respiratory:  Negative for cough and shortness of breath.   Cardiovascular:  Positive for chest pain. Negative for leg swelling.  Gastrointestinal:  Negative for abdominal pain.  Musculoskeletal:  Negative for back pain.    Physical Exam Updated Vital Signs BP 132/79   Pulse (!) 46   Temp 98.2 F (36.8 C) (Oral)   Resp 15   SpO2 100%  Physical Exam Vitals and nursing note reviewed.  Constitutional:      Appearance: She is well-developed.  HENT:     Head: Normocephalic and atraumatic.  Cardiovascular:     Rate and Rhythm: Normal rate and regular rhythm.     Pulses:          Radial pulses are 2+ on the  right side.     Heart sounds: Normal heart sounds.  Pulmonary:     Effort: Pulmonary effort is normal.     Breath sounds: Normal breath sounds.  Abdominal:     General: There is no distension.  Musculoskeletal:     Right lower leg: No edema.     Left lower leg: No edema.  Skin:    General: Skin is warm and dry.  Neurological:     Mental Status: She is alert.     ED Results / Procedures / Treatments   Labs (all labs ordered are listed, but only abnormal results are displayed) Labs Reviewed  BASIC METABOLIC PANEL - Abnormal; Notable for the following components:      Result Value   Glucose, Bld 104 (*)    All other components within normal limits  CBC  TROPONIN I (HIGH SENSITIVITY)  TROPONIN I (HIGH SENSITIVITY)    EKG EKG Interpretation Date/Time:  Friday June 11 2023 21:12:04 EDT Ventricular Rate:  47 PR Interval:  173 QRS Duration:  75 QT Interval:  468 QTC Calculation: 414 R Axis:   76  Text Interpretation: Sinus bradycardia Low voltage, precordial leads no acute ST/T changes Confirmed by Pricilla Loveless [phone]) on 06/11/2023 9:20:28 PM  Radiology DG  Chest 2 View  Result Date: 06/11/2023 CLINICAL DATA:  Sudden onset chest and right arm pain radiating to the elbow. This lasted approximately 15 minutes. History of breast and ovarian cancer with breast lumpectomy. EXAM: CHEST - 2 VIEW COMPARISON:  11/13/2014 FINDINGS: Normal sized heart. Mildly tortuous aorta due to mild levoconvex thoracic scoliosis. Clear lungs with normal vascularity. Mild thoracic and upper lumbar spine degenerative changes. IMPRESSION: No acute abnormality. Electronically Signed   By: Beckie Salts M.D.   On: 06/11/2023 19:31    Procedures Procedures    Medications Ordered in ED Medications - No data to display  ED Course/ Medical Decision Making/ A&P                                 Medical Decision Making Amount and/or Complexity of Data Reviewed Labs: ordered.    Details: Normal  troponin x 2. Radiology: ordered and independent interpretation performed.    Details: No Pneumothorax ECG/medicine tests: ordered and independent interpretation performed.    Details: No ischemia   Patient presents with a brief episode of chest pain.  Currently asymptomatic.  Has been asymptomatic now for multiple hours.  She has risk factors including her age and being on cholesterol medicine but otherwise no significant risk factors.  Given the brief and atypical nature, combined with unremarkable EKG, normal troponins, I do not think ACS is likely and do not think she needs urgent cardiac workup.  Can follow-up as an outpatient.  Doubt PE or dissection.  At this point, will discharge home to follow-up with PCP.  Return precautions.        Final Clinical Impression(s) / ED Diagnoses Final diagnoses:  Nonspecific chest pain    Rx / DC Orders ED Discharge Orders     None         Pricilla Loveless, MD 06/11/23 2232

## 2023-06-14 DIAGNOSIS — Z23 Encounter for immunization: Secondary | ICD-10-CM | POA: Diagnosis not present

## 2023-06-14 NOTE — Telephone Encounter (Signed)
Patient called to follow up about episode she had over the weekend. She said she is good and her labs came back normal and are in [place]. She said she wasn't sure if Dr Lenord Fellers would want to see her or not but just wanted to let Dr Lenord Fellers know. Per [place] Dr Lenord Fellers wanted patient to come in for ED follow up this week so I called patient back and scheduled her for tomorrow at noon

## 2023-06-15 ENCOUNTER — Encounter: Payer: Self-pay | Admitting: Internal Medicine

## 2023-06-15 ENCOUNTER — Ambulatory Visit (INDEPENDENT_AMBULATORY_CARE_PROVIDER_SITE_OTHER): Payer: Medicare Other | Admitting: Internal Medicine

## 2023-06-15 VITALS — BP 100/80 | HR 63 | Ht 65.0 in | Wt 136.0 lb

## 2023-06-15 DIAGNOSIS — Z853 Personal history of malignant neoplasm of breast: Secondary | ICD-10-CM | POA: Diagnosis not present

## 2023-06-15 DIAGNOSIS — E7800 Pure hypercholesterolemia, unspecified: Secondary | ICD-10-CM

## 2023-06-15 DIAGNOSIS — R079 Chest pain, unspecified: Secondary | ICD-10-CM

## 2023-06-15 DIAGNOSIS — Z8543 Personal history of malignant neoplasm of ovary: Secondary | ICD-10-CM | POA: Diagnosis not present

## 2023-06-15 NOTE — Progress Notes (Addendum)
Patient Care Team: Margaree Mackintosh, MD as PCP - General (Internal Medicine) Jodelle Red, MD as PCP - Cardiology (Cardiology)  Visit Date: 06/15/23  Subjective:    Patient ID: Lauren Calhoun , Female   DOB: [birth year], 72 y.o.    MRN: [id number]   72 y.o. Female presents today for a hospital/ED follow-up. She was seen in [facility] ED on 06/11/23 for chest pain. On that day, she was driving to the park and had an episode of central-left chest pain that she rates 7/10. This lasted for 5 minutes and then returned for another 5 minutes. She was not doing anything strenuous beforehand. Upon exiting the car she had tingling in her extremities and neck. EKG showed sinus bradycardia. Chest X-ray showed no acute abnormality. Normal troponin x 2. Declines chest pain, shortness of breath. She has been taking atorvastatin and is concerned this is contributing to her symptoms.  Past Medical History:  Diagnosis Date   Cancer (HCC)    Breast,sential node   Ovarian cancer (HCC)    Tear of meniscus of knee joint    presently has a tear left knee     Family History  Problem Relation Age of Onset   Cancer Mother        UTERINE AGE 26   Cancer Sister        BREAST AGE 59   Cancer Maternal Aunt        Ovarian cancer   Colon cancer Neg Hx    Colon polyps Neg Hx    Diabetes Neg Hx    Rectal cancer Neg Hx    Stomach cancer Neg Hx     Social history: She is married and has 3 adult daughters.  She is now retired.  Family history: Father died at age 74.  Mother died at age 17 of uterine cancer.  Sister died at age 41 of breast cancer.  1 sister with history of hypertension.  Her 3 daughters all adults are in good health.  She had colonoscopy January 2024 showing several inflammatory polyps and recommended repeat colonoscopy 2034.  Had mammogram December 2023 and has not had repeat study.  History of osteopenia with bone density study -2.3 in 2017 and has not wanted to be on bone sparing  medication  History of stage Ic ovarian tumor and history of stage I right breast cancer.  She was referred here by Dr. Darnelle Catalan for primary care in 2012.  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  0 out of 1 sentinel lymph nodes involved.  She received Taxol/Herceptin followed by radiation all of which she completed in April 2006.  She was found to have bilateral ovarian neoplasms in 2013 with an elevated CEA of 125 by Dr. Leda Quail.  She underwent TAH/BSO and bilateral pelvic lymphadenectomy,.  Aortic lymphadenectomy, omentectomy, multiple peritoneal biopsies in November 2013.  All biopsies were negative for metastatic disease.  Pathology showed low malignant potential tumor with focal areas of microinvasion.  No adjuvant chemotherapy was recommended at that time and she has been followed regularly since then.  CA125 returned to normal.   Review of Systems  Constitutional:  Negative for fever and malaise/fatigue.  HENT:  Negative for congestion.   Eyes:  Negative for blurred vision.  Respiratory:  Negative for cough and shortness of breath.   Cardiovascular:  Negative for chest pain, palpitations and  leg swelling.  Gastrointestinal:  Negative for vomiting.  Musculoskeletal:  Negative for back pain.  Skin:  Negative for rash.  Neurological:  Negative for loss of consciousness and headaches.        Objective:   Vitals: BP 100/80   Pulse 63   Ht 5\' 5"  (1.651 m)   Wt 136 lb (61.7 kg)   SpO2 97%   BMI 22.63 kg/m    Physical Exam Vitals and nursing note reviewed.  Constitutional:      General: She is not in acute distress.    Appearance: Normal appearance. She is not toxic-appearing.  HENT:     Head: Normocephalic and atraumatic.     Right Ear: Hearing, ear canal and external ear normal.     Left Ear: Hearing, ear canal and external ear normal.     Ears:      Comments: Right and left TM's slightly full, no erythema. Cardiovascular:     Rate and Rhythm: Normal rate and regular rhythm. No extrasystoles are present.    Pulses: Normal pulses.     Heart sounds: Normal heart sounds. No murmur heard.    No friction rub. No gallop.  Pulmonary:     Effort: Pulmonary effort is normal. No respiratory distress.     Breath sounds: Normal breath sounds. No wheezing or rales.  Skin:    General: Skin is warm and dry.  Neurological:     Mental Status: She is alert and oriented to person, place, and time. Mental status is at baseline.  Psychiatric:        Mood and Affect: Mood normal.        Behavior: Behavior normal.        Thought Content: Thought content normal.        Judgment: Judgment normal.       Results:   Studies obtained and personally reviewed by me:  EKG showed sinus bradycardia.  Chest X-ray showed no acute abnormality.  Labs:       Component Value Date/Time   NA 138 06/11/2023 1824   NA 145 08/07/2016 1211   K 3.6 06/11/2023 1824   K 4.1 08/07/2016 1211   CL 105 06/11/2023 1824   CO2 26 06/11/2023 1824   CO2 29 08/07/2016 1211   GLUCOSE 104 (H) 06/11/2023 1824   GLUCOSE 96 08/07/2016 1211   BUN 20 06/11/2023 1824   BUN 12.8 08/07/2016 1211   CREATININE 0.75 06/11/2023 1824   CREATININE 0.85 03/19/2023 0912   CREATININE 0.9 08/07/2016 1211   CALCIUM 10.1 06/11/2023 1824   CALCIUM 10.3 08/07/2016 1211   PROT 6.7 03/19/2023 0912   PROT 7.1 08/07/2016 1212   PROT 7.7 08/07/2016 1211   ALBUMIN 4.6 06/10/2022 1123   ALBUMIN 4.3 08/07/2016 1211   AST 20 03/19/2023 0912   AST 23 08/07/2016 1211   ALT 16 03/19/2023 0912   ALT 18 08/07/2016 1211   ALKPHOS 75 06/10/2022 1123   ALKPHOS 93 08/07/2016 1211   BILITOT 0.9 03/19/2023 0912   BILITOT 0.98 08/07/2016 1211   GFRNONAA >60 06/11/2023 1824   GFRNONAA 62 03/14/2021 0918   GFRAA 72 03/14/2021 0918     Lab Results  Component Value Date   WBC 4.3 06/11/2023    HGB 14.0 06/11/2023   HCT 43.0 06/11/2023   MCV 92.9 06/11/2023   PLT 211 06/11/2023    Lab Results  Component Value Date   CHOL 214 (H) 03/19/2023   HDL 81 03/19/2023  LDLCALC 114 (H) 03/19/2023   TRIG 87 03/19/2023   CHOLHDL 2.6 03/19/2023    Lab Results  Component Value Date   HGBA1C 5.5 06/11/2022     Lab Results  Component Value Date   TSH 1.97 03/19/2023      Assessment & Plan:   Chest pain; hospital/ED follow-up: MI recently ruled out with recent episode of chest pain.  Has not wanted to be on statin medication. Referral made today to Cardiologist, Dr. Jodelle Red.  Had coronary calcium scoring done in August 2023 and total score was 167.  Vaccine counseling: she has had her annual Covid-19 booster and her second dose of the shingles vaccine.  History of ovarian neoplasms bilateral in 2013 treated with surgical resection and no adjuvant therapy recommended  History of osteopenia and has not wanted to be on bone sparing medication  History of stage I right breast cancer invasive ductal carcinoma 2005 treated with Taxol Herceptin followed by radiation which was completed in April 2006  I,Alexander Ruley,acting as a Neurosurgeon for Margaree Mackintosh, MD.,have documented all relevant documentation on the behalf of Margaree Mackintosh, MD,as directed by  Margaree Mackintosh, MD while in the presence of Margaree Mackintosh, MD.   I, Margaree Mackintosh, MD, have reviewed all documentation for this visit. The documentation on 06/23/23 for the exam, diagnosis, procedures, and orders are all accurate and complete.

## 2023-06-18 DIAGNOSIS — H5203 Hypermetropia, bilateral: Secondary | ICD-10-CM | POA: Diagnosis not present

## 2023-06-18 DIAGNOSIS — H2513 Age-related nuclear cataract, bilateral: Secondary | ICD-10-CM | POA: Diagnosis not present

## 2023-06-23 NOTE — Patient Instructions (Addendum)
Referral made to Dr. Cristal Deer, Cardiologist for evaluation of recent episode of chest pain that occurred while driving.  She has mild hyperlipidemia and has not wanted to be on statin medication.  Previous coronary calcium scoring was 167.

## 2023-08-02 ENCOUNTER — Ambulatory Visit: Payer: Medicare Other | Admitting: General Practice

## 2023-08-19 NOTE — Progress Notes (Deleted)
Cardiology Clinic Note   Patient Name: Lauren Calhoun Date of Encounter: 08/19/2023  Primary Care Provider:  Margaree Mackintosh, MD Primary Cardiologist:  Jodelle Red, MD  Patient Profile    Lauren Calhoun 72 year old female presents the clinic today for follow-up evaluation of her chest discomfort and hyperlipidemia.  Past Medical History    Past Medical History:  Diagnosis Date   Cancer (HCC)    Breast,sential node   Ovarian cancer (HCC)    Tear of meniscus of knee joint    presently has a tear left knee   Past Surgical History:  Procedure Laterality Date   ABDOMINAL HYSTERECTOMY  08/23/2012   Procedure: HYSTERECTOMY ABDOMINAL;  Surgeon: Jeannette Corpus, MD;  Location: [facility];  Service: Gynecology;  Laterality: N/A;  Perineal biopsies   APPENDECTOMY  AGE 40   BREAST LUMPECTOMY  2005   RIGHT   LYMPHADENECTOMY  08/23/2012   Procedure: LYMPHADENECTOMY;  Surgeon: Jeannette Corpus, MD;  Location: [facility];  Service: Gynecology;;  Pelvic and para-aortic   OMENTECTOMY  08/23/2012   Procedure: OMENTECTOMY;  Surgeon: Jeannette Corpus, MD;  Location: [facility];  Service: Gynecology;;   SALPINGOOPHORECTOMY  08/23/2012   Procedure: SALPINGO OOPHORECTOMY;  Surgeon: Jeannette Corpus, MD;  Location: [facility];  Service: Gynecology;  Laterality: Bilateral;    Allergies  No Known Allergies  History of Present Illness    Lauren Calhoun is a PMH of breast cancer, ovarian cancer, meniscus tear, coronary artery calcification on CT scan, hyperlipidemia bilateral tinnitus and chest discomfort.  She has family history of a paternal aunt who had strokes in her 33s.  Coronary calcium scoring 05/15/2022 showed a coronary calcium score of 167.  (Left main 0, LAD 143, left circumflex artery 1, right coronary artery 23.  She was seen in follow-up by Dr. Cristal Deer on 10/23/2022.  During that time she remained stable from a cardiac standpoint.  She had changed to a  Mediterranean-based diet.  She was avoiding meat and processed food.  She lost about 10 pounds without trying.  She remained physically active.  Lifestyle management of CV risk was reviewed and felt to be a good course of treatment.  She was seen in the emergency department on 06/11/2023.  She had 2 episodes of chest discomfort while driving.  EMS was contacted.  EKG showed no acute changes.  On exam in the emergency department she was asymptomatic.  Her chest x-ray showed no acute abnormalities her BMP was reassuring.  Her CBC was unremarkable.  Her high-sensitivity troponin was noted to be 3 and 3.  There was low suspicion for ACS or PE.  She was instructed to follow-up with her PCP.  Coronary artery disease-no chest pain today.  2 episodes of chest discomfort that led to emergency department visit on 06/11/2023.  Symptoms not felt to be cardiac in nature.  Coronary calcium score 167. Continue Mediterranean diet Continue current physical activity  Hyperlipidemia-LDL***. High-fiber diet Maintain physical activity Repeat fasting lipids and LFTs  Disposition: Follow-up with Dr. Cristal Deer or me in 12 months.  Home Medications    Prior to Admission medications   Not on File    Family History    Family History  Problem Relation Age of Onset   Cancer Mother        UTERINE AGE 73   Cancer Sister        BREAST AGE 54   Cancer Maternal Aunt  Ovarian cancer   Colon cancer Neg Hx    Colon polyps Neg Hx    Diabetes Neg Hx    Rectal cancer Neg Hx    Stomach cancer Neg Hx    She indicated that her mother is deceased. She indicated that her father is alive. She indicated that only one of her two sisters is alive. She indicated that her maternal aunt is alive. She indicated that the status of her neg hx is unknown.  Social History    Social History   Socioeconomic History   Marital status: Married    Spouse name: Not on file   Number of children: Not on file   Years of education:  Not on file   Highest education level: Not on file  Occupational History   Not on file  Tobacco Use   Smoking status: Never   Smokeless tobacco: Never  Vaping Use   Vaping status: Never Used  Substance and Sexual Activity   Alcohol use: Yes    Comment: RARE MAYBE 2X MONTH   Drug use: No   Sexual activity: Yes  Other Topics Concern   Not on file  Social History Narrative   Not on file   Social Determinants of Health   Financial Resource Strain: Not on file  Food Insecurity: Not on file  Transportation Needs: Not on file  Physical Activity: Not on file  Stress: Not on file  Social Connections: Not on file  Intimate Partner Violence: Not on file     Review of Systems    General:  No chills, fever, night sweats or weight changes.  Cardiovascular:  No chest pain, dyspnea on exertion, edema, orthopnea, palpitations, paroxysmal nocturnal dyspnea. Dermatological: No rash, lesions/masses Respiratory: No cough, dyspnea Urologic: No hematuria, dysuria Abdominal:   No nausea, vomiting, diarrhea, bright red blood per rectum, melena, or hematemesis Neurologic:  No visual changes, wkns, changes in mental status. All other systems reviewed and are otherwise negative except as noted above.  Physical Exam    VS:  There were no vitals taken for this visit. , BMI There is no height or weight on file to calculate BMI. GEN: Well nourished, well developed, in no acute distress. HEENT: normal. Neck: Supple, no JVD, carotid bruits, or masses. Cardiac: RRR, no murmurs, rubs, or gallops. No clubbing, cyanosis, edema.  Radials/DP/PT 2+ and equal bilaterally.  Respiratory:  Respirations regular and unlabored, clear to auscultation bilaterally. GI: Soft, nontender, nondistended, BS + x 4. MS: no deformity or atrophy. Skin: warm and dry, no rash. Neuro:  Strength and sensation are intact. Psych: Normal affect.  Accessory Clinical Findings    Recent Labs: 03/19/2023: ALT 16; TSH  1.97 06/11/2023: BUN 20; Creatinine, Ser 0.75; Hemoglobin 14.0; Platelets 211; Potassium 3.6; Sodium 138   Recent Lipid Panel    Component Value Date/Time   CHOL 214 (H) 03/19/2023 0912   CHOL 204 (H) 09/25/2022 0818   TRIG 87 03/19/2023 0912   HDL 81 03/19/2023 0912   HDL 82 09/25/2022 0818   CHOLHDL 2.6 03/19/2023 0912   VLDL 15 12/28/2016 1136   LDLCALC 114 (H) 03/19/2023 0912    No BP recorded.  {Refresh Note OR Click here to enter BP  :1}***    ECG personally reviewed by me today- ***    Calcium score 05/15/22 FINDINGS: Coronary arteries: Normal origins.   Coronary Calcium Score:  Left main: 0  Left anterior descending artery: 143  Left circumflex artery: 1  Right coronary artery:  23  Total: 167  Percentile: 79   Pericardium: Normal.  Ascending Aorta: Normal caliber.  Non-cardiac: See separate report from [facility].   IMPRESSION: Coronary calcium score of 167. This was 65 percentile for age-, race-, and sex-matched controls.      Assessment & Plan   1.  ***   Thomasene Ripple. Giuliano Preece NP-C     08/19/2023, 9:43 AM [facility] [address] Office [phone] Fax [phone]    I spent***minutes examining this patient, reviewing medications, and using patient centered shared decision making involving her cardiac care.   I spent greater than 20 minutes reviewing her past medical history,  medications, and prior cardiac tests.

## 2023-08-23 ENCOUNTER — Ambulatory Visit: Payer: Medicare Other | Admitting: General Practice

## 2023-08-27 ENCOUNTER — Encounter (HOSPITAL_BASED_OUTPATIENT_CLINIC_OR_DEPARTMENT_OTHER): Payer: Self-pay | Admitting: Family

## 2023-08-27 ENCOUNTER — Ambulatory Visit (INDEPENDENT_AMBULATORY_CARE_PROVIDER_SITE_OTHER): Payer: Medicare Other | Admitting: Family

## 2023-08-27 VITALS — BP 118/60 | HR 64 | Ht 65.0 in | Wt 138.5 lb

## 2023-08-27 DIAGNOSIS — E785 Hyperlipidemia, unspecified: Secondary | ICD-10-CM

## 2023-08-27 DIAGNOSIS — I2510 Atherosclerotic heart disease of native coronary artery without angina pectoris: Secondary | ICD-10-CM

## 2023-08-27 NOTE — Patient Instructions (Addendum)
Medication Instructions:  No changes today   *If you need a refill on your cardiac medications before your next appointment, please call your pharmacy*   Lab Work: Do your lab work with Dr. Lenord Fellers in June - we will ask her to add on a Lipoprotein (a)  If you have labs (blood work) drawn today and your tests are completely normal, you will receive your results only by: MyChart Message (if you have MyChart) OR A paper copy in the mail If you have any lab test that is abnormal or we need to change your treatment, we will call you to review the results.  Follow-Up: At [facility], you and your health needs are our priority.  As part of our continuing mission to provide you with exceptional heart care, we have created designated Provider Care Teams.  These Care Teams include your primary Cardiologist (physician) and Advanced Practice Providers (APPs -  Physician Assistants and Nurse Practitioners) who all work together to provide you with the care you need, when you need it.  We recommend signing up for the patient portal called "MyChart".  Sign up information is provided on this After Visit Summary.  MyChart is used to connect with patients for Virtual Visits (Telemedicine).  Patients are able to view lab/test results, encounter notes, upcoming appointments, etc.  Non-urgent messages can be sent to your provider as well.   To learn more about what you can do with MyChart, go to [email or web address].    Your next appointment:   As needed  Other Instructions  If your cholesterol will not improve we will consider: Repatha Leqvio Nexlizet or Nextelol

## 2023-08-27 NOTE — Progress Notes (Signed)
Cardiology Office Note:  .   Date:  08/27/2023  ID:  Lauren Calhoun, DOB Nov 20, 1950, MRN [id number] PCP: Margaree Mackintosh, MD  [facility] Providers Cardiologist:  Jodelle Red, MD    History of Present Illness: .   Lauren Calhoun is a 72 y.o. female coronary artery disease, hyperlipidemia, ovarian neoplasms s/p surgical resection, osteopenia, stage I right breast cancer invasive ductal carcinoma 2005 treated with Taxol Herceptin followed by radiation.   Prior calcium score 05/2022 of the 167. ED visit 06/11/2023 with chest pain sudden onset while driving described as sharp, tight, squeezing across entire chest but mostly in the middle lasting 5 minutes.  Associated with some right arm tingling and right neck discomfort.  Troponin negative X2.  Presents today for follow up independently. Exercises regularly by walking and doing power yoga. No recurrent chest pain. Attributes prior episode to Atorvastatin. Prefers lifestyle management of cholesterol. Previously on [employer] now on [place] way of eating. Prefers to trial this for 6 months and recheck lipid at PCP visit in June. If lipids are not improving, she is willing to consider medical therapy. We discussed Nexlizet, Repatha, and Leqvio.   Prior lipid lowering agents Rosuvastatin - "out of body experience" Atorvastatin - attributes to episode of chest pain  ROS: Please see the history of present illness.    All other systems reviewed and are negative.   Studies Reviewed: Marland Kitchen   EKG Interpretation Date/Time:  Friday August 27 2023 08:40:39 EST Ventricular Rate:  56 PR Interval:  162 QRS Duration:  76 QT Interval:  432 QTC Calculation: 416 R Axis:   49  Text Interpretation: Sinus bradycardia Low voltage QRS No acute changes Confirmed by Gillian Shields ([id number]) on 08/27/2023 8:42:21 AM    Cardiac Studies & Procedures          CT SCANS  CT CARDIAC SCORING (SELF PAY ONLY) 05/14/2022  Addendum 05/15/2022 11:28  AM ADDENDUM REPORT: 05/15/2022 11:26  EXAM: OVER-READ INTERPRETATION  CT CHEST  The following report is an over-read performed by radiologist Dr. Lesia Hausen [facility] on 05/15/2022. This over-read does not include interpretation of cardiac or coronary anatomy or pathology. The coronary calcium score interpretation by the cardiologist is attached.  COMPARISON:  06/26/2004 CT chest, abdomen and pelvis.  FINDINGS: Cardiovascular: Normal heart size. No significant pericardial effusion/thickening. Great vessels are normal in course and caliber.  Mediastinum/Nodes: Unremarkable esophagus. No pathologically enlarged mediastinal or hilar lymph nodes, noting limited sensitivity for the detection of hilar adenopathy on this noncontrast study.  Lungs/Pleura: No pneumothorax. No pleural effusion. No acute consolidative airspace disease, lung masses or significant pulmonary nodules.  Upper abdomen: No acute abnormality.  Musculoskeletal: No aggressive appearing focal osseous lesions. Moderate thoracic spondylosis.  IMPRESSION: No significant extracardiac findings.   Electronically Signed By: Delbert Phenix M.D. On: 05/15/2022 11:26  Narrative CLINICAL DATA:  Cardiovascular Disease Risk stratification  EXAM: Coronary Calcium Score  TECHNIQUE: A gated, non-contrast computed tomography scan of the heart was performed using 3mm slice thickness. Axial images were analyzed on a dedicated workstation. Calcium scoring of the coronary arteries was performed using the Agatston method.  FINDINGS: Coronary arteries: Normal origins.  Coronary Calcium Score:  Left main: 0  Left anterior descending artery: 143  Left circumflex artery: 1  Right coronary artery: 23  Total: 167  Percentile: 79  Pericardium: Normal.  Ascending Aorta: Normal caliber.  Non-cardiac: See separate report from [facility].  IMPRESSION: Coronary calcium score of 167.  This  was 54 percentile for age-, race-, and sex-matched controls.  RECOMMENDATIONS: Coronary artery calcium (CAC) score is a strong predictor of incident coronary heart disease (CHD) and provides predictive information beyond traditional risk factors. CAC scoring is reasonable to use in the decision to withhold, postpone, or initiate statin therapy in intermediate-risk or selected borderline-risk asymptomatic adults (age 19-75 years and LDL-C >=70 to <190 mg/dL) who do not have diabetes or established atherosclerotic cardiovascular disease (ASCVD).* In intermediate-risk (10-year ASCVD risk >=7.5% to <20%) adults or selected borderline-risk (10-year ASCVD risk >=5% to <7.5%) adults in whom a CAC score is measured for the purpose of making a treatment decision the following recommendations have been made:  If CAC=0, it is reasonable to withhold statin therapy and reassess in 5 to 10 years, as long as higher risk conditions are absent (diabetes mellitus, family history of premature CHD in first degree relatives (males <55 years; females <65 years), cigarette smoking, or LDL >=190 mg/dL).  If CAC is 1 to 99, it is reasonable to initiate statin therapy for patients >=55 years of age.  If CAC is >=100 or >=75th percentile, it is reasonable to initiate statin therapy at any age.  Cardiology referral should be considered for patients with CAC scores >=400 or >=75th percentile.  *2018 AHA/ACC/AACVPR/AAPA/ABC/ACPM/ADA/AGS/APhA/ASPC/NLA/PCNA Guideline on the Management of Blood Cholesterol: A Report of the American College of Cardiology/American Heart Association Task Force on Clinical Practice Guidelines. J Am Coll Cardiol. 2019;73(24):3168-3209.  Electronically Signed: By: Donato Schultz M.D. On: 05/14/2022 18:21          Risk Assessment/Calculations:             Physical Exam:   VS:  BP 118/60   Pulse 64   Ht 5\' 5"  (1.651 m)   Wt 138 lb 8 oz (62.8 kg)   SpO2 97%   BMI 23.05  kg/m    Wt Readings from Last 3 Encounters:  08/27/23 138 lb 8 oz (62.8 kg)  06/15/23 136 lb (61.7 kg)  03/22/23 136 lb 12 oz (62 kg)    GEN: Well nourished, well developed in no acute distress NECK: No JVD; No carotid bruits CARDIAC: RRR, no murmurs, rubs, gallops RESPIRATORY:  Clear to auscultation without rales, wheezing or rhonchi  ABDOMEN: Soft, non-tender, non-distended EXTREMITIES:  No edema; No deformity   ASSESSMENT AND PLAN: .    Nonobstructive coronary artery disease / HLD, LDL goal <70 - Stable with no anginal symptoms. No indication for ischemic evaluation. Prior chest pain atypical for angina. EKG today no acute changes. Bradycardia longstanding, asymptomatic, and related to her regular walking regimen.  Calcium score indicative of nonobstructive disease. Exercising regularly.  Prefers to trial [place] way of eating for 6 months and recheck lipid at PCP visit in June.  Will request Lp(a) be added to assess for familial hyperlipidemia.  If lipids are not improving, she is willing to consider medical therapy. We discussed Nexlizet, Repatha, and Leqvio. She was given info about these medications.  Will touch base via MyChart after her lab work in June with PCP.        Dispo: follow up as needed  Signed, Alver Sorrow, NP

## 2023-09-21 DIAGNOSIS — F4312 Post-traumatic stress disorder, chronic: Secondary | ICD-10-CM | POA: Diagnosis not present

## 2023-09-30 DIAGNOSIS — Z1231 Encounter for screening mammogram for malignant neoplasm of breast: Secondary | ICD-10-CM | POA: Diagnosis not present

## 2023-09-30 LAB — HM MAMMOGRAPHY

## 2023-10-13 ENCOUNTER — Encounter: Payer: Self-pay | Admitting: Internal Medicine

## 2023-11-04 ENCOUNTER — Ambulatory Visit (INDEPENDENT_AMBULATORY_CARE_PROVIDER_SITE_OTHER): Payer: Medicare Other | Admitting: Family Medicine

## 2023-11-04 ENCOUNTER — Encounter: Payer: Self-pay | Admitting: Family Medicine

## 2023-11-04 VITALS — BP 128/84 | Ht 66.0 in | Wt 130.0 lb

## 2023-11-04 DIAGNOSIS — M546 Pain in thoracic spine: Secondary | ICD-10-CM

## 2023-11-04 DIAGNOSIS — M419 Scoliosis, unspecified: Secondary | ICD-10-CM | POA: Diagnosis not present

## 2023-11-04 NOTE — Progress Notes (Signed)
PCP: Margaree Mackintosh, MD  Subjective:  CC: upper back pain HPI: Patient is a 73 y.o. female here for back pain. The patient reports a "popping" sensation to her thoracic back while carrying a couch. About a day later, she developed a burning pain to the right thoracic region brought on by certain movements such as twisting of the torso. About a week ago, she injured the area again while exerting herself with new radiating pain down her right rib cage and into her right flank. Pain is worsened exacerbated with weight bearing. She has aching pain while resting. The pain has woken her up from sleep. She saw PT about a week ago where they started her on upper back exercises and she has been taking ibuprofen intermittently with gradual improvement of her pain. Denies pain with flexion or extension of there back. No radiating pain, tingling, numbness or distal weakness to her lower extremities.   She is a breast cancer survivor which has been in remission for over a decade. She had a Dexa scan completed around 2017 that showed osteopenia. She denies history of known fractures to the back, but reports a similar mechanical injury in her 30s where she may have subluxed a rib.   Past Medical History:  Diagnosis Date   Cancer (HCC)    Breast,sential node   Ovarian cancer (HCC)    Tear of meniscus of knee joint    presently has a tear left knee    No current outpatient medications on file prior to visit.   No current facility-administered medications on file prior to visit.    Past Surgical History:  Procedure Laterality Date   ABDOMINAL HYSTERECTOMY  08/23/2012   Procedure: HYSTERECTOMY ABDOMINAL;  Surgeon: Jeannette Corpus, MD;  Location: [facility];  Service: Gynecology;  Laterality: N/A;  Perineal biopsies   APPENDECTOMY  AGE 89   BREAST LUMPECTOMY  2005   RIGHT   LYMPHADENECTOMY  08/23/2012   Procedure: LYMPHADENECTOMY;  Surgeon: Jeannette Corpus, MD;  Location: [facility];  Service:  Gynecology;;  Pelvic and para-aortic   OMENTECTOMY  08/23/2012   Procedure: OMENTECTOMY;  Surgeon: Jeannette Corpus, MD;  Location: [facility];  Service: Gynecology;;   SALPINGOOPHORECTOMY  08/23/2012   Procedure: SALPINGO OOPHORECTOMY;  Surgeon: Jeannette Corpus, MD;  Location: [facility];  Service: Gynecology;  Laterality: Bilateral;    No Known Allergies  BP 128/84   Ht 5\' 6"  (1.676 m)   Wt 130 lb (59 kg)   BMI 20.98 kg/m       No data to display              No data to display              Objective:  Physical Exam:  Gen: NAD, comfortable in exam room MSK: No midline cervical, thoracic, or lumbar tenderness with palpation or reflex hammer impacts. There is point tenderness over the right rhomboids that are in spasm. Full flexion and extension of the back without pain. Pain with right twisting/rotation of the upper back. Normal rib expansion with respiration anteriorly and posteriorly.    Assessment & Plan:  1. Acute Upper Back pain Three weeks of back pain in the setting of a mechanical injury while lifting a couch. Described popping sensation raises suspicion for possible rib subluxation/slipping rib that appears to be back in place on exam. Initial concern for compression fracture given history of breast cancer and osteopenia, however, exam is reassuring for any midline  tenderness. Pain at this time appears mostly due to a rhomboid spasm in the setting of transient rib subluxation. She may have had a similar injury to the area several decades ago. Pain is improving thus lowering concern for significant injury. Provided home PT exercises for rhomboid stabilization. Continue ibuprofen as needed. Advised patient to follow-up with PCP for repeat osteoporosis screening given her risk factors. The patient expressed understanding and agrees with the plan.   Nechama Guard, MS4 [facility]

## 2023-11-23 DIAGNOSIS — F4312 Post-traumatic stress disorder, chronic: Secondary | ICD-10-CM | POA: Diagnosis not present

## 2023-12-02 ENCOUNTER — Ambulatory Visit (INDEPENDENT_AMBULATORY_CARE_PROVIDER_SITE_OTHER): Payer: Medicare Other | Admitting: Family Medicine

## 2023-12-02 ENCOUNTER — Encounter: Payer: Self-pay | Admitting: Family Medicine

## 2023-12-02 VITALS — BP 122/78 | Ht 66.0 in | Wt 130.0 lb

## 2023-12-02 DIAGNOSIS — M170 Bilateral primary osteoarthritis of knee: Secondary | ICD-10-CM

## 2023-12-02 DIAGNOSIS — M23204 Derangement of unspecified medial meniscus due to old tear or injury, left knee: Secondary | ICD-10-CM

## 2023-12-02 DIAGNOSIS — M25552 Pain in left hip: Secondary | ICD-10-CM | POA: Diagnosis not present

## 2023-12-02 DIAGNOSIS — M23200 Derangement of unspecified lateral meniscus due to old tear or injury, right knee: Secondary | ICD-10-CM | POA: Diagnosis not present

## 2023-12-02 NOTE — Progress Notes (Signed)
 DATE OF VISIT: 12/02/2023        Lauren Calhoun DOB: [birth year] MRN: [id number]  CC:  knee pain, Lt hip pain  History of present Illness: Lauren Calhoun is a 73 y.o. female who presents for evaluation of bilateral knee pain & Lt hip pain Hx of bilateral knee meniscus tear - treated conservatively in the past  RT knee started to flare 5 days ago - in [facility] visiting daughter who has a lot of stairs - drove to [facility] ([address] drive) - feels she is walking funny Pain along medial knee No swelling No mechanical symptoms Using knee sleeve Started HEP that has been helpful No meds for this No prior injections Prior Rt knee issues in 2019 - had xrays and MRI - MRI showing medial and lateral meniscus tears - was treated conservatively  Starting to have some left knee pain as well Ordered knee sleeve for this knee No swelling or mechanical symptoms No prior injections Prior LT knee issues in 2022 - saw surgeon - treated conservatively  Also left lateral hip pain Feels weaker on that side - has been chronic issue since hysterectomy years ago No meds for this  Medications:  No outpatient encounter medications on file as of 12/02/2023.   No facility-administered encounter medications on file as of 12/02/2023.    Allergies: has no known allergies.  Physical Examination: Vitals: BP 122/78   Ht 5\' 6"  (1.676 m)   Wt 130 lb (59 kg)   BMI 20.98 kg/m  GENERAL:  Lauren Calhoun is a 73 y.o. female appearing their stated age, alert and oriented x 3, in no apparent distress.  SKIN: no rashes or lesions, skin clean, dry, intact MSK:  KNEES: Right knee without swelling or effusion.  Full range of motion with 1+ patellofemoral crepitus.  Tender to palpation along the medial joint line.  No lateral joint line tenderness.  No tenderness over the patellar tendon or the quad.  Negative Murray, negative Lachman, negative varus and valgus stress. Left knee without swelling or effusion.  Full range of motion  without pain or significant crepitus.  No tenderness palpation along the medial or lateral joint lines.  No tenderness over the patellar tendon or the quad.  Negative Murray, negative Lachman, negative varus and valgus stress  Hip: Left hip without any gross deformity.  Mild tenderness palpation over the greater trochanter.  Full range of motion without pain.  Mild global hip weakness 4/5 throughout. Right hip without gross deformity.  No tenderness over the greater trochanter.  Full range of motion without pain.  Hip strength 5 -/5 throughout Walking without a limp Neurovascular intact distally  Radiology: MRI LT KNEE 01/07/21 showing: IMPRESSION: 1. Large grade 3 oblique tear of the posterior horn and midbody medial meniscus involving the inferior surface. 2. Free edge fraying in the midbody lateral meniscus. 3. Considerable chondral thinning in the medial compartment with mild marginal spurring. Mild chondral thinning in the lateral compartment. 4. Small knee effusion. 5. Trace pes anserine bursitis and mild semimembranosus-tibial collateral ligament bursitis.  LT KNEE XR 11/28/20 showing: IMPRESSION: Negative.  MRI RT KNEE 12/06/17 showing: IMPRESSION: 1. Undersurface tear of the midbody and posterior horn of the lateral meniscus. 2. Undersurface tear of the midbody of the lateral meniscus. 3. Small focal full-thickness cartilage defect in the posterior aspect of the medial femoral condyle.  RT KNEE XR 12/06/17 showing: IMPRESSION: No acute or significant chronic bony abnormality of the right knee is observed. Assessment &  Plan Other old tear of medial meniscus of left knee Acute left knee pain with known history of prior meniscus tear as seen on MRI several years ago, has done well with conservative therapy in the past, no mechanical symptoms  Plan: -Recommend knee sleeve, she is already ordered a body helix knee sleeve -Recommend ibuprofen 600 mg 3 times daily with food for  the next 1 to 2 weeks, then as needed.  GI precautions discussed -Heat or ice as needed -Provided home exercise program -Going to consider cortisone injection or hinged knee brace in the future if symptoms are worsening -Follow-up as needed Other old tear of lateral meniscus of right knee Acute right knee pain with known history of prior meniscus tear as seen on MRI several years ago, has done well with conservative therapy in the past, no mechanical symptoms  Plan: - cont knee sleeve with Body Helix sleeve -Recommend ibuprofen 600 mg 3 times daily with food for the next 1 to 2 weeks, then as needed.  GI precautions discussed -Heat or ice as needed -Provided home exercise program -Going to consider cortisone injection or hinged knee brace in the future if symptoms are worsening -Follow-up as needed Primary osteoarthritis of both knees Lateral knee osteoarthritis with known meniscus tears in both knees, has done well with conservative therapy in the past, no mechanical symptoms  Plan: -Will continue with conservative care as noted above -Could consider hinged knee brace or injections in the future if needed -Follow-up as needed Greater trochanteric pain syndrome of left lower extremity Mild left lateral hip pain with greater trochanteric pain syndrome, likely related to altered gait from knee issues.  Also with some mild hip weakness  Plan -Provided home exercise program -Ibuprofen 600 mg 3 times daily with food for the next 1 to 2 weeks, then as needed.  GI precautions discussed -Heat or ice as needed -If worsening or not improving, otherwise as needed  Encounter Diagnoses  Name Primary?   Other old tear of medial meniscus of left knee Yes   Other old tear of lateral meniscus of right knee    Primary osteoarthritis of both knees    Greater trochanteric pain syndrome of left lower extremity     No orders of the defined types were placed in this encounter.

## 2024-01-28 ENCOUNTER — Telehealth: Payer: Self-pay | Admitting: *Deleted

## 2024-01-28 NOTE — Telephone Encounter (Signed)
 Copied from CRM [id number]. Topic: Medical Record Request - Records Request >> Jan 20, 2024  2:19 PM Lauren Calhoun wrote: Reason for CRM: Pt requesting her vaccination records for a job position she applied for. She needs them as soon as possible. Please follow up with patient.

## 2024-01-31 NOTE — Telephone Encounter (Signed)
 Spoke to patient, she stated she got them off MyChart and she is good.

## 2024-02-12 DIAGNOSIS — L259 Unspecified contact dermatitis, unspecified cause: Secondary | ICD-10-CM | POA: Diagnosis not present

## 2024-02-24 ENCOUNTER — Ambulatory Visit (INDEPENDENT_AMBULATORY_CARE_PROVIDER_SITE_OTHER): Admitting: Family Medicine

## 2024-02-24 ENCOUNTER — Encounter: Payer: Self-pay | Admitting: Family Medicine

## 2024-02-24 VITALS — BP 124/80 | Ht 66.0 in | Wt 130.0 lb

## 2024-02-24 DIAGNOSIS — G8929 Other chronic pain: Secondary | ICD-10-CM | POA: Diagnosis not present

## 2024-02-24 DIAGNOSIS — R269 Unspecified abnormalities of gait and mobility: Secondary | ICD-10-CM

## 2024-02-24 DIAGNOSIS — M25552 Pain in left hip: Secondary | ICD-10-CM

## 2024-02-24 DIAGNOSIS — M545 Low back pain, unspecified: Secondary | ICD-10-CM | POA: Diagnosis not present

## 2024-02-24 NOTE — Progress Notes (Addendum)
 PCP: Sylvan Evener, MD  Subjective:   CC: Gait Abnormality  HPI: Patient is a 73 y.o. female here for Gait abnormality.  Cozy reports having an abnormal gait during a recent trip to [place]  [place] where she was walked 13 and 10 miles on consecutive days. She describes having to turn in her knees and pull her RT side and arm back and LT side forward to relieve some of the discomfort in her RT knee and LT hip. She has been dealing with these issues for a couple months. She has been doing at home PT exercises for LT greater trochanter pain syndrome a couple months with little relief. She has not had any catching or locking of either knee. She has not taken anti inflammatories or done anything else for treatment.  Past Medical History:  Diagnosis Date   Cancer (HCC)    Breast,sential node   Ovarian cancer (HCC)    Tear of meniscus of knee joint    presently has a tear left knee    No current outpatient medications on file prior to visit.   No current facility-administered medications on file prior to visit.    Past Surgical History:  Procedure Laterality Date   ABDOMINAL HYSTERECTOMY  08/23/2012   Procedure: HYSTERECTOMY ABDOMINAL;  Surgeon: Verdia Glad, MD;  Location: [facility];  Service: Gynecology;  Laterality: N/A;  Perineal biopsies   APPENDECTOMY  AGE 78   BREAST LUMPECTOMY  2005   RIGHT   LYMPHADENECTOMY  08/23/2012   Procedure: LYMPHADENECTOMY;  Surgeon: Verdia Glad, MD;  Location: [facility];  Service: Gynecology;;  Pelvic and para-aortic   OMENTECTOMY  08/23/2012   Procedure: OMENTECTOMY;  Surgeon: Verdia Glad, MD;  Location: [facility];  Service: Gynecology;;   SALPINGOOPHORECTOMY  08/23/2012   Procedure: SALPINGO OOPHORECTOMY;  Surgeon: Verdia Glad, MD;  Location: [facility];  Service: Gynecology;  Laterality: Bilateral;    No Known Allergies  BP 124/80   Ht 5\' 6"  (1.676 m)   Wt 130 lb (59 kg)   BMI 20.98 kg/m       No data to  display              No data to display              Objective:  Physical Exam:  Gen: NAD, comfortable in exam room  LT Hip Exam/Low back exam: Inspection: No obvious deformity. Mild scoliosis and protrusion of left scapula. Palpation: TTP along LT greater trochanter. No TTP but tightness on LT compared to RT along quadratus lumborum, gluteus medius, and piriformis. No bony process or paraspinal muscle TTP along lumbar spine. ROM: Full ROM Strength: 5/5 strength with hip abduction, adduction, flexion, knee extension/flexion. Neuro/Vasc: Intact distally. Special Tests: Positive standing flexion test on the LT. Negative seated flexion test. Negative straight leg raise bilaterally. Mild Trendelenburg gait on the LT with gait analysis.  Knee Exam: RT knee - No effusion or erythema present. No TTP along medial or lateral joint line. No TTP along patella or pes anserine bursitis. Full ROM. 5/5 strength with knee extension/flexion. Negative McMurray, Apley, Thessaly, lachman and no joint laxity with varus or valgus stress.  LT knee - No effusion or erythema present. No TTP along medial or lateral joint line. No TTP along patella or pes anserine bursitis. Full ROM. 5/5 strength with knee extension/flexion. Negative McMurray, Apley, Thessaly, lachman and no joint laxity with varus or valgus stress.   Assessment & Plan:  Stana Ear  is a 73yo F with hx of mild thoracic scoliosis, bilateral knee OA, LT greater trochanteric pain syndrome who is presenting for worsening gait during recent travel.  1. Gait Abnormality - Patient's 97-month hx of LT-sided pain with recent gait worsening and exam today with mild trendelenburg gait on the LT and muscle tightness localized around the LT hip suggest gait abnormality secondary to her chronic conditions affecting her gait biomechanics. Patient's exam today doesn't reflect any focal weakness. Given chronicity of her conditions and no improvement with at-home PT  exercises, will get updated imaging today. Recommend further evaluation and treatment for gait analysis and postural instability with formal physical therapy. -Ordered Xrays of Lumbar spine and LT hip with pelvis. Will f/u with results. -Referral sent for formal PT evaluation and treatment -Advised continuing home exercise program for now as tolerated, with OTC antiinflammatories and heat/icing as needed -F/u if no improvement or if symptoms worsen  Unknown Garbe, [facility]

## 2024-02-25 ENCOUNTER — Ambulatory Visit
Admission: RE | Admit: 2024-02-25 | Discharge: 2024-02-25 | Disposition: A | Discharge status: Home / Self Care | Source: Ambulatory Visit | Attending: Family Medicine | Admitting: Family Medicine

## 2024-02-25 DIAGNOSIS — M545 Low back pain, unspecified: Secondary | ICD-10-CM

## 2024-02-25 DIAGNOSIS — M1612 Unilateral primary osteoarthritis, left hip: Secondary | ICD-10-CM | POA: Diagnosis not present

## 2024-02-25 DIAGNOSIS — M25552 Pain in left hip: Secondary | ICD-10-CM

## 2024-03-01 ENCOUNTER — Ambulatory Visit: Payer: Self-pay | Admitting: Family Medicine

## 2024-03-08 DIAGNOSIS — L989 Disorder of the skin and subcutaneous tissue, unspecified: Secondary | ICD-10-CM | POA: Diagnosis not present

## 2024-03-20 DIAGNOSIS — M25552 Pain in left hip: Secondary | ICD-10-CM | POA: Diagnosis not present

## 2024-03-21 ENCOUNTER — Other Ambulatory Visit: Payer: Medicare Other

## 2024-03-21 DIAGNOSIS — Z0000 Encounter for general adult medical examination without abnormal findings: Secondary | ICD-10-CM

## 2024-03-21 DIAGNOSIS — E7800 Pure hypercholesterolemia, unspecified: Secondary | ICD-10-CM | POA: Diagnosis not present

## 2024-03-21 DIAGNOSIS — M8580 Other specified disorders of bone density and structure, unspecified site: Secondary | ICD-10-CM

## 2024-03-22 LAB — CBC WITH DIFFERENTIAL/PLATELET
Absolute Lymphocytes: 1261 {cells}/uL (ref 850–3900)
Absolute Monocytes: 240 {cells}/uL (ref 200–950)
Basophils Absolute: 10 {cells}/uL (ref 0–200)
Basophils Relative: 0.3 %
Eosinophils Absolute: 118 {cells}/uL (ref 15–500)
Eosinophils Relative: 3.7 %
HCT: 43.1 % (ref 35.0–45.0)
Hemoglobin: 14.3 g/dL (ref 11.7–15.5)
MCH: 29.7 pg (ref 27.0–33.0)
MCHC: 33.2 g/dL (ref 32.0–36.0)
MCV: 89.4 fL (ref 80.0–100.0)
MPV: 10.7 fL (ref 7.5–12.5)
Monocytes Relative: 7.5 %
Neutro Abs: 1571 {cells}/uL (ref 1500–7800)
Neutrophils Relative %: 49.1 %
Platelets: 228 10*3/uL (ref 140–400)
RBC: 4.82 10*6/uL (ref 3.80–5.10)
RDW: 13.6 % (ref 11.0–15.0)
Total Lymphocyte: 39.4 %
WBC: 3.2 10*3/uL — ABNORMAL LOW (ref 3.8–10.8)

## 2024-03-22 LAB — COMPLETE METABOLIC PANEL WITHOUT GFR
AG Ratio: 1.8 (calc) (ref 1.0–2.5)
ALT: 16 U/L (ref 6–29)
AST: 25 U/L (ref 10–35)
Albumin: 4.4 g/dL (ref 3.6–5.1)
Alkaline phosphatase (APISO): 119 U/L (ref 37–153)
BUN: 14 mg/dL (ref 7–25)
CO2: 29 mmol/L (ref 20–32)
Calcium: 10.4 mg/dL (ref 8.6–10.4)
Chloride: 107 mmol/L (ref 98–110)
Creat: 0.95 mg/dL (ref 0.60–1.00)
Globulin: 2.5 g/dL (ref 1.9–3.7)
Glucose, Bld: 94 mg/dL (ref 65–99)
Potassium: 5.2 mmol/L (ref 3.5–5.3)
Sodium: 143 mmol/L (ref 135–146)
Total Bilirubin: 0.8 mg/dL (ref 0.2–1.2)
Total Protein: 6.9 g/dL (ref 6.1–8.1)

## 2024-03-22 LAB — LIPID PANEL
Cholesterol: 203 mg/dL — ABNORMAL HIGH (ref <200)
HDL: 75 mg/dL (ref >=50)
LDL Cholesterol (Calc): 108 mg/dL — ABNORMAL HIGH
Non-HDL Cholesterol (Calc): 128 mg/dL (ref <130)
Total CHOL/HDL Ratio: 2.7 (calc) (ref <5.0)
Triglycerides: 106 mg/dL (ref <150)

## 2024-03-22 LAB — TSH: TSH: 2.23 m[IU]/L (ref 0.40–4.50)

## 2024-03-23 ENCOUNTER — Ambulatory Visit: Payer: Medicare Other | Admitting: Internal Medicine

## 2024-03-23 VITALS — BP 110/76 | HR 67 | Temp 98.4°F | Ht 66.0 in | Wt 132.8 lb

## 2024-03-23 DIAGNOSIS — Z853 Personal history of malignant neoplasm of breast: Secondary | ICD-10-CM

## 2024-03-23 DIAGNOSIS — Z0000 Encounter for general adult medical examination without abnormal findings: Secondary | ICD-10-CM | POA: Diagnosis not present

## 2024-03-23 DIAGNOSIS — E7800 Pure hypercholesterolemia, unspecified: Secondary | ICD-10-CM | POA: Diagnosis not present

## 2024-03-23 DIAGNOSIS — M8580 Other specified disorders of bone density and structure, unspecified site: Secondary | ICD-10-CM | POA: Diagnosis not present

## 2024-03-23 DIAGNOSIS — D485 Neoplasm of uncertain behavior of skin: Secondary | ICD-10-CM | POA: Diagnosis not present

## 2024-03-23 DIAGNOSIS — L570 Actinic keratosis: Secondary | ICD-10-CM | POA: Diagnosis not present

## 2024-03-23 LAB — POC URINALSYSI DIPSTICK (AUTOMATED)
Bilirubin, UA: NEGATIVE
Glucose, UA: NEGATIVE
Ketones, UA: NEGATIVE
Leukocytes, UA: NEGATIVE
Nitrite, UA: NEGATIVE
Protein, UA: NEGATIVE
Spec Grav, UA: <=1.005 — AB (ref 1.010–1.025)
Urobilinogen, UA: 0.2 U/dL
pH, UA: 7 (ref 5.0–8.0)

## 2024-03-23 NOTE — Progress Notes (Signed)
 Annual Wellness Visit   Patient Care Team: Durk Carmen, Ronal PARAS, MD as PCP - General (Internal Medicine) Lonni Slain, MD as PCP - Cardiology (Cardiology)  Visit Date: 03/23/24   Chief Complaint  Patient presents with   Annual Exam   Subjective:  Patient: Lauren Calhoun, Female DOB: 28-Sep-1951, 73 y.o. MRN: [id number] Vitals:   03/23/24 1006  BP: 110/76   Lauren Calhoun is a 73 y.o. Female who presents today for her Annual Wellness Visit. Patient has Lateral meniscus derangement; Breast cancer (HCC); Ovarian low malignant potential tumor; Urinary incontinence; Acute pain of right knee; Tinnitus, bilateral; Stenosing tenosynovitis of finger of right hand; Impacted cerumen of both ears; Eustachian tube dysfunction, bilateral; and Hyperlipidemia LDL goal <70 on their problem list.  History of Hyperlipidemia with 03/21/2024 Lipid Panel, compared to 03/2023: Cholesterol 203, decreased from 214; LDL 108, decreased from 114; otherwise WNL. Says that she has been working on her diet for the past year.  Labs 03/21/2024 CBC, compared to 03/2023: WBC 3.2, no change; otherwise WNL.  CMP: WNL  TSH: 2.230  S/p Hysterectomy and BSO.  Mammogram 09/30/2023  normal with repeat recommendation of 2025.  Colonoscopy 10/2022 showing several inflammatory polyps with repeat recommendation of 2034.  Bone Density 2017 T-score AP Spine -2.3, osteopenic.   Vaccine Counseling: There are no preventive care reminders to display for this patient. Past Medical History:  Diagnosis Date   Cancer (HCC)    Breast,sential node   Ovarian cancer (HCC)    Tear of meniscus of knee joint    presently has a tear left knee   Medical/Surgical History Narrative:   Allergic/Intolerant to: No Known Allergies  History of bladder dysfunction seen by Dr. Keneth in 2014.  Right ankle fracture at age 53.  Appendectomy at age 31.  History of kidney stones seen on CT scan.  These have been asymptomatic.   Has been  tested by Dr. Corina in 2020 for complaint of mild cognitive defects but did well on testing.  Perhaps was related to chemotherapy in the past.  Past Surgical History:  Procedure Laterality Date   ABDOMINAL HYSTERECTOMY  08/23/2012   Procedure: HYSTERECTOMY ABDOMINAL;  Surgeon: Toribio LITTIE Percy, MD;  Location: [facility];  Service: Gynecology;  Laterality: N/A;  Perineal biopsies   APPENDECTOMY  AGE 55   BREAST LUMPECTOMY  2005   RIGHT   LYMPHADENECTOMY  08/23/2012   Procedure: LYMPHADENECTOMY;  Surgeon: Toribio LITTIE Percy, MD;  Location: [facility];  Service: Gynecology;;  Pelvic and para-aortic   OMENTECTOMY  08/23/2012   Procedure: OMENTECTOMY;  Surgeon: Toribio LITTIE Percy, MD;  Location: [facility];  Service: Gynecology;;   SALPINGOOPHORECTOMY  08/23/2012   Procedure: SALPINGO OOPHORECTOMY;  Surgeon: Toribio LITTIE Percy, MD;  Location: [facility];  Service: Gynecology;  Laterality: Bilateral;   Family History  Problem Relation Age of Onset   Cancer Mother        UTERINE AGE 14   Cancer Sister        BREAST AGE 53   Cancer Maternal Aunt        Ovarian cancer   Colon cancer Neg Hx    Colon polyps Neg Hx    Diabetes Neg Hx    Rectal cancer Neg Hx    Stomach cancer Neg Hx   Family history: Father died at age 40. Mother died at age 64 of uterine cancer. Sister died at age 49 of breast cancer. 1 sister with history of hypertension. 3 adult  daughters in good health.   Social history: She is married and has 3 adult daughters.  2025 - 1 Daughter and grandchildren live in [place] -she prefers to drive to see them and takes breaks while driving. New job at [employer] working less than 25 hrs/ month with Infusion therapy.  Review of Systems  Constitutional:  Negative for chills, fever, malaise/fatigue and weight loss.  HENT:  Negative for hearing loss, sinus pain and sore throat.   Respiratory:  Negative for cough, hemoptysis and shortness of breath.   Cardiovascular:  Negative  for chest pain, palpitations, leg swelling and PND.  Gastrointestinal:  Negative for abdominal pain, constipation, diarrhea, heartburn, nausea and vomiting.  Genitourinary:  Negative for dysuria, frequency and urgency.  Musculoskeletal:  Negative for back pain, myalgias and neck pain.  Skin:  Negative for itching and rash.  Neurological:  Negative for dizziness, tingling, seizures and headaches.  Endo/Heme/Allergies:  Negative for polydipsia.  Psychiatric/Behavioral:  Negative for depression. The patient is not nervous/anxious.     Objective:  Vitals: BP 110/76   Pulse 67   Temp 98.4 F (36.9 C) (Temporal)   Ht 5' 6 (1.676 m)   Wt 132 lb 12.8 oz (60.2 kg)   SpO2 98%   BMI 21.43 kg/m  Physical Exam Vitals and nursing note reviewed.  Constitutional:      General: She is not in acute distress.    Appearance: Normal appearance. She is not ill-appearing or toxic-appearing.  HENT:     Head: Normocephalic and atraumatic.     Right Ear: Hearing, tympanic membrane, ear canal and external ear normal.     Left Ear: Hearing, tympanic membrane, ear canal and external ear normal.     Ears:     Comments: Small, non-obstructing piece of wax visualized in canal    Mouth/Throat:     Pharynx: Oropharynx is clear.   Eyes:     Extraocular Movements: Extraocular movements intact.     Pupils: Pupils are equal, round, and reactive to light.   Neck:     Thyroid : No thyroid  mass, thyromegaly or thyroid  tenderness.     Vascular: No carotid bruit.   Cardiovascular:     Rate and Rhythm: Normal rate and regular rhythm. No extrasystoles are present.    Pulses:          Dorsalis pedis pulses are 2+ on the right side and 2+ on the left side.     Heart sounds: Normal heart sounds. No murmur heard.    No friction rub. No gallop.  Pulmonary:     Effort: Pulmonary effort is normal.     Breath sounds: Normal breath sounds. No decreased breath sounds, wheezing, rhonchi or rales.  Chest:     Chest wall:  No mass.  Breasts:    Right: Normal. No swelling, bleeding, inverted nipple, mass, nipple discharge, skin change or tenderness.     Left: Normal. No swelling, bleeding, inverted nipple, mass, nipple discharge, skin change or tenderness.  Abdominal:     Palpations: Abdomen is soft. There is no hepatomegaly, splenomegaly or mass.     Tenderness: There is no abdominal tenderness.     Hernia: No hernia is present.  Genitourinary:    Comments: Pelvic exam deferred  Musculoskeletal:     Cervical back: Normal range of motion.     Right lower leg: No edema.     Left lower leg: No edema.  Lymphadenopathy:     Cervical: No cervical adenopathy.  Upper Body:     Right upper body: No supraclavicular adenopathy.     Left upper body: No supraclavicular adenopathy.   Skin:    General: Skin is warm and dry.   Neurological:     General: No focal deficit present.     Mental Status: She is alert and oriented to person, place, and time. Mental status is at baseline.     Sensory: Sensation is intact.     Motor: Motor function is intact. No weakness.     Deep Tendon Reflexes: Reflexes are normal and symmetric.   Psychiatric:        Attention and Perception: Attention normal.        Mood and Affect: Mood normal.        Speech: Speech normal.        Behavior: Behavior normal.        Thought Content: Thought content normal.        Cognition and Memory: Cognition normal.        Judgment: Judgment normal.   Most Recent Functional Status Assessment:    03/23/2024   10:03 AM  In your present state of health, do you have any difficulty performing the following activities:  Hearing? 0  Vision? 0  Difficulty concentrating or making decisions? 0  Walking or climbing stairs? 0  Dressing or bathing? 0  Doing errands, shopping? 0  Using the Toilet? N  In the past six months, have you accidently leaked urine? N  Managing your Medications? N  Managing your Finances? N  Housekeeping or managing your  Housekeeping? N   Most Recent Fall Risk Assessment:    03/21/2024    3:51 PM  Fall Risk   Falls in the past year? 0  Number falls in past yr: 0  Injury with Fall? 0   Most Recent Depression Screenings:    03/23/2024   10:05 AM 03/22/2023   11:04 AM  PHQ 2/9 Scores  PHQ - 2 Score 0 0   Most Recent Cognitive Screening:    03/23/2024   10:05 AM  6CIT Screen  What Year? 0 points  What month? 0 points  What time? 0 points  Count back from 20 0 points  Months in reverse 0 points  Repeat phrase 0 points  Total Score 0 points   Results:  Studies Obtained And Personally Reviewed By Me:  Mammogram 09/30/2023  normal with repeat recommendation of 2025.  Colonoscopy 10/2022 showing several inflammatory polyps with repeat recommendation of 2034.  Bone Density 2017 T-score AP Spine -2.3, osteopenic.   Labs:     Component Value Date/Time   NA 143 03/21/2024 0913   NA 145 08/07/2016 1211   K 5.2 03/21/2024 0913   K 4.1 08/07/2016 1211   CL 107 03/21/2024 0913   CO2 29 03/21/2024 0913   CO2 29 08/07/2016 1211   GLUCOSE 94 03/21/2024 0913   GLUCOSE 96 08/07/2016 1211   BUN 14 03/21/2024 0913   BUN 12.8 08/07/2016 1211   CREATININE 0.95 03/21/2024 0913   CREATININE 0.9 08/07/2016 1211   CALCIUM  10.4 03/21/2024 0913   CALCIUM  10.3 08/07/2016 1211   PROT 6.9 03/21/2024 0913   PROT 7.1 08/07/2016 1212   PROT 7.7 08/07/2016 1211   ALBUMIN 4.6 06/10/2022 1123   ALBUMIN 4.3 08/07/2016 1211   AST 25 03/21/2024 0913   AST 23 08/07/2016 1211   ALT 16 03/21/2024 0913   ALT 18 08/07/2016 1211   ALKPHOS 75 06/10/2022 1123  ALKPHOS 93 08/07/2016 1211   BILITOT 0.8 03/21/2024 0913   BILITOT 0.98 08/07/2016 1211   GFRNONAA >60 06/11/2023 1824   GFRNONAA 62 03/14/2021 0918   GFRAA 72 03/14/2021 0918    Lab Results  Component Value Date   WBC 3.2 (L) 03/21/2024   HGB 14.3 03/21/2024   HCT 43.1 03/21/2024   MCV 89.4 03/21/2024   PLT 228 03/21/2024   Lab Results  Component  Value Date   CHOL 203 (H) 03/21/2024   HDL 75 03/21/2024   LDLCALC 108 (H) 03/21/2024   TRIG 106 03/21/2024   CHOLHDL 2.7 03/21/2024   Lab Results  Component Value Date   HGBA1C 5.5 06/11/2022    Lab Results  Component Value Date   TSH 2.23 03/21/2024    Assessment & Plan:  Hx bilateral ovarian neoplasms- removed by Dr. Devonna 2013 with low malignant potential  Hx Stage 1 right Breast cancer s/p lumpectomy with sentinel node dissection 2005. Received Taxol/Herceptin followed by radiation completed treatment April 2006  Other Labs Reviewed today: CBC, compared to 03/2023: WBC 3.2, no change; otherwise WNL.  CMP: WNL  TSH: 2.230  History of Hyperlipidemia with 03/21/2024 Lipid Panel, compared to 03/2023: Cholesterol 203, decreased from 214; LDL 108, decreased from 114; otherwise WNL. Says that she has been working on her diet for the past year.  Osteopenia- T score -2.3 in 2017  Benign colon polyps  Hx Vitamin D  deficiency   S/p Hysterectomy and BSO.  Mammogram 09/30/2023  normal with repeat recommendation of 2025.  Colonoscopy 10/2022 showing several inflammatory polyps with repeat recommendation of 2034.  Bone Density 2017 T-score AP Spine -2.3, osteopenic.   Vaccine Counseling: There are no preventive care reminders to display for this patient.   Annual wellness visit done today including the all of the following: Reviewed patient's Family Medical History Reviewed and updated list of patient's medical providers Assessment of cognitive impairment was done Assessed patient's functional ability Established a written schedule for health screening services Health Risk Assessent Completed and Reviewed  Discussed health benefits of physical activity, and encouraged her to engage in regular exercise appropriate for her age and condition.    I,Emily Lagle,acting as a Neurosurgeon for Ronal JINNY Hailstone, MD.,have documented all relevant documentation on the behalf of Ronal JINNY Hailstone, MD,as directed by  Ronal JINNY Hailstone, MD while in the presence of Ronal JINNY Hailstone, MD.   I, Ronal JINNY Hailstone, MD, have reviewed all documentation for this visit. The documentation on 03/31/24 for the exam, diagnosis, procedures, and orders are all accurate and complete.

## 2024-03-24 ENCOUNTER — Encounter (HOSPITAL_BASED_OUTPATIENT_CLINIC_OR_DEPARTMENT_OTHER): Payer: Self-pay

## 2024-03-31 ENCOUNTER — Encounter: Payer: Self-pay | Admitting: Internal Medicine

## 2024-03-31 NOTE — Patient Instructions (Addendum)
It was a pleasure to see you today. Labs are stable. Please return in one year or as needed.

## 2024-04-06 DIAGNOSIS — H9190 Unspecified hearing loss, unspecified ear: Secondary | ICD-10-CM | POA: Diagnosis not present

## 2024-04-06 DIAGNOSIS — H6123 Impacted cerumen, bilateral: Secondary | ICD-10-CM | POA: Diagnosis not present

## 2024-04-06 DIAGNOSIS — H6993 Unspecified Eustachian tube disorder, bilateral: Secondary | ICD-10-CM | POA: Diagnosis not present

## 2024-04-06 DIAGNOSIS — H9213 Otorrhea, bilateral: Secondary | ICD-10-CM | POA: Diagnosis not present

## 2024-05-25 DIAGNOSIS — M1812 Unilateral primary osteoarthritis of first carpometacarpal joint, left hand: Secondary | ICD-10-CM | POA: Diagnosis not present

## 2024-06-22 DIAGNOSIS — H524 Presbyopia: Secondary | ICD-10-CM | POA: Diagnosis not present

## 2024-06-22 DIAGNOSIS — H43813 Vitreous degeneration, bilateral: Secondary | ICD-10-CM | POA: Diagnosis not present

## 2024-06-22 DIAGNOSIS — H2513 Age-related nuclear cataract, bilateral: Secondary | ICD-10-CM | POA: Diagnosis not present

## 2024-07-06 DIAGNOSIS — Z23 Encounter for immunization: Secondary | ICD-10-CM | POA: Diagnosis not present

## 2024-07-31 DIAGNOSIS — F4312 Post-traumatic stress disorder, chronic: Secondary | ICD-10-CM | POA: Diagnosis not present

## 2024-09-29 LAB — HM MAMMOGRAPHY

## 2024-10-02 ENCOUNTER — Encounter: Payer: Self-pay | Admitting: Internal Medicine
